# Patient Record
Sex: Female | Born: 1985 | Race: Black or African American | Hispanic: No | Marital: Single | Smoking: Current every day smoker
Health system: Southern US, Community
[De-identification: ages and names within clinical notes are randomized; demographics above are authoritative.]

## PROBLEM LIST (undated history)

## (undated) ENCOUNTER — Inpatient Hospital Stay (HOSPITAL_COMMUNITY): Payer: Self-pay

## (undated) DIAGNOSIS — N898 Other specified noninflammatory disorders of vagina: Secondary | ICD-10-CM

## (undated) DIAGNOSIS — G43909 Migraine, unspecified, not intractable, without status migrainosus: Secondary | ICD-10-CM

## (undated) DIAGNOSIS — A599 Trichomoniasis, unspecified: Secondary | ICD-10-CM

## (undated) DIAGNOSIS — D649 Anemia, unspecified: Secondary | ICD-10-CM

## (undated) DIAGNOSIS — O26892 Other specified pregnancy related conditions, second trimester: Secondary | ICD-10-CM

## (undated) DIAGNOSIS — R350 Frequency of micturition: Secondary | ICD-10-CM

## (undated) DIAGNOSIS — Z309 Encounter for contraceptive management, unspecified: Secondary | ICD-10-CM

## (undated) DIAGNOSIS — Z87898 Personal history of other specified conditions: Secondary | ICD-10-CM

## (undated) DIAGNOSIS — R319 Hematuria, unspecified: Secondary | ICD-10-CM

## (undated) DIAGNOSIS — N949 Unspecified condition associated with female genital organs and menstrual cycle: Secondary | ICD-10-CM

## (undated) DIAGNOSIS — O139 Gestational [pregnancy-induced] hypertension without significant proteinuria, unspecified trimester: Secondary | ICD-10-CM

## (undated) HISTORY — DX: Unspecified condition associated with female genital organs and menstrual cycle: N94.9

## (undated) HISTORY — DX: Frequency of micturition: R35.0

## (undated) HISTORY — DX: Trichomoniasis, unspecified: A59.9

## (undated) HISTORY — PX: NO PAST SURGERIES: SHX2092

## (undated) HISTORY — DX: Encounter for contraceptive management, unspecified: Z30.9

## (undated) HISTORY — DX: Hematuria, unspecified: R31.9

## (undated) HISTORY — DX: Other specified pregnancy related conditions, second trimester: O26.892

## (undated) HISTORY — DX: Gestational (pregnancy-induced) hypertension without significant proteinuria, unspecified trimester: O13.9

## (undated) HISTORY — DX: Other specified noninflammatory disorders of vagina: N89.8

---
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
## 2004-11-18 ENCOUNTER — Emergency Department (HOSPITAL_COMMUNITY): Admission: EM | Admit: 2004-11-18 | Discharge: 2004-11-19 | Payer: Self-pay | Admitting: *Deleted

## 2004-12-13 ENCOUNTER — Emergency Department (HOSPITAL_COMMUNITY): Admission: EM | Admit: 2004-12-13 | Discharge: 2004-12-13 | Payer: Self-pay | Admitting: Emergency Medicine

## 2004-12-15 ENCOUNTER — Emergency Department (HOSPITAL_COMMUNITY): Admission: EM | Admit: 2004-12-15 | Discharge: 2004-12-15 | Payer: Self-pay | Admitting: Emergency Medicine

## 2004-12-27 ENCOUNTER — Emergency Department (HOSPITAL_COMMUNITY): Admission: EM | Admit: 2004-12-27 | Discharge: 2004-12-28 | Payer: Self-pay | Admitting: Emergency Medicine

## 2005-01-24 ENCOUNTER — Emergency Department (HOSPITAL_COMMUNITY): Admission: EM | Admit: 2005-01-24 | Discharge: 2005-01-24 | Payer: Self-pay | Admitting: Emergency Medicine

## 2005-02-12 ENCOUNTER — Ambulatory Visit: Payer: Self-pay | Admitting: Family Medicine

## 2005-02-15 ENCOUNTER — Ambulatory Visit (HOSPITAL_COMMUNITY): Admission: RE | Admit: 2005-02-15 | Discharge: 2005-02-15 | Payer: Self-pay | Admitting: Family Medicine

## 2005-02-19 ENCOUNTER — Encounter (HOSPITAL_COMMUNITY): Admission: RE | Admit: 2005-02-19 | Discharge: 2005-03-01 | Payer: Self-pay | Admitting: Family Medicine

## 2005-02-20 ENCOUNTER — Emergency Department (HOSPITAL_COMMUNITY): Admission: EM | Admit: 2005-02-20 | Discharge: 2005-02-20 | Payer: Self-pay | Admitting: Emergency Medicine

## 2005-03-27 ENCOUNTER — Ambulatory Visit: Payer: Self-pay | Admitting: Family Medicine

## 2005-04-27 ENCOUNTER — Emergency Department (HOSPITAL_COMMUNITY): Admission: EM | Admit: 2005-04-27 | Discharge: 2005-04-27 | Payer: Self-pay | Admitting: Emergency Medicine

## 2005-05-02 ENCOUNTER — Ambulatory Visit: Payer: Self-pay | Admitting: Family Medicine

## 2005-06-08 ENCOUNTER — Emergency Department (HOSPITAL_COMMUNITY): Admission: EM | Admit: 2005-06-08 | Discharge: 2005-06-08 | Payer: Self-pay | Admitting: Emergency Medicine

## 2005-07-08 ENCOUNTER — Emergency Department (HOSPITAL_COMMUNITY): Admission: EM | Admit: 2005-07-08 | Discharge: 2005-07-08 | Payer: Self-pay | Admitting: *Deleted

## 2005-07-09 ENCOUNTER — Emergency Department (HOSPITAL_COMMUNITY): Admission: EM | Admit: 2005-07-09 | Discharge: 2005-07-09 | Payer: Self-pay | Admitting: Emergency Medicine

## 2005-08-24 ENCOUNTER — Emergency Department (HOSPITAL_COMMUNITY): Admission: EM | Admit: 2005-08-24 | Discharge: 2005-08-24 | Payer: Self-pay | Admitting: Emergency Medicine

## 2005-10-05 ENCOUNTER — Emergency Department (HOSPITAL_COMMUNITY): Admission: EM | Admit: 2005-10-05 | Discharge: 2005-10-05 | Payer: Self-pay | Admitting: Emergency Medicine

## 2005-12-07 IMAGING — CT CT HEAD W/O CM
1 series · 16 of 26 positions shown, 20 images · IV contrast (agent unspecified)
Comparison: None.

CLINICAL DATA: Left-sided headache.  
 HEAD CT WITHOUT CONTRAST:
TECHNIQUE: Contiguous axial images were obtained from the base of the skull through the vertex according to standard protocol without contrast.

[Series 2708: — · axial · 0.43mm/px · z∈[-666,-551]mm · 16 of 26 slices shown, 20 images]
[im 2/26  brain]
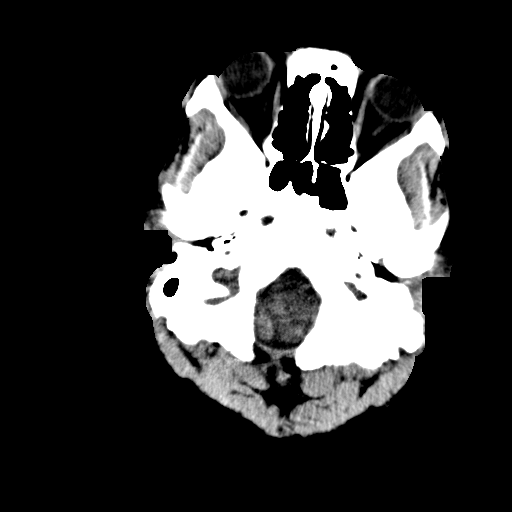
[im 2/26  bone]
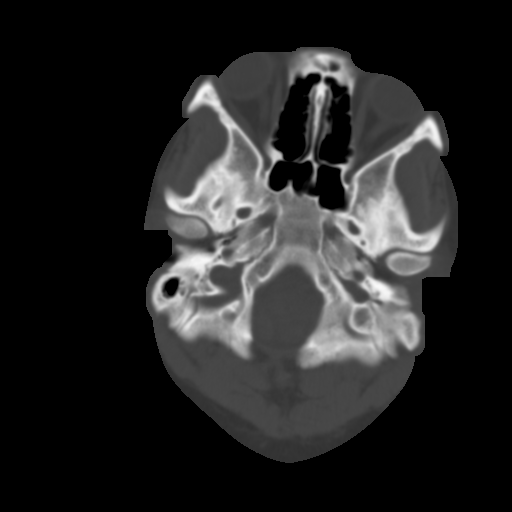
[im 4/26  brain]
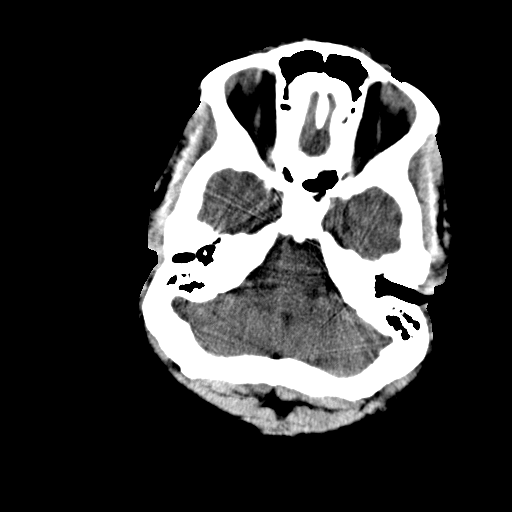
[im 5/26  brain]
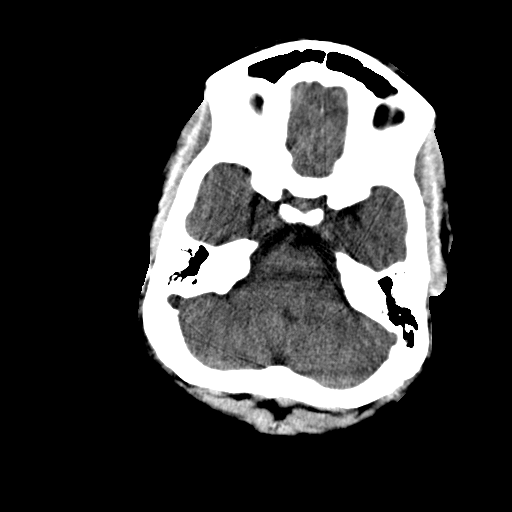
[im 7/26  brain]
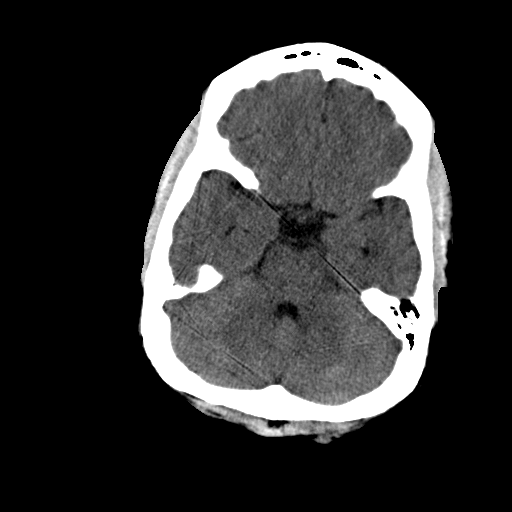
[im 8/26  brain]
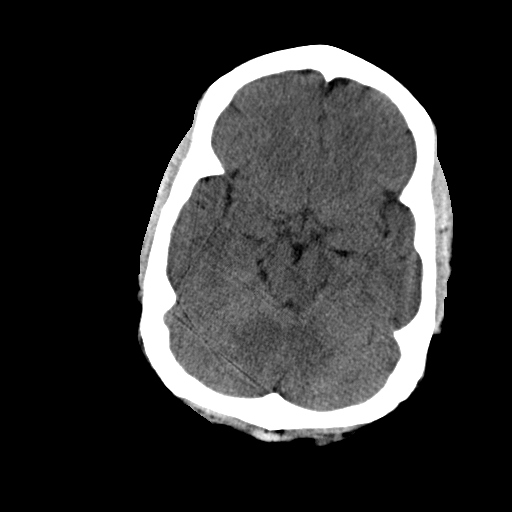
[im 8/26  bone]
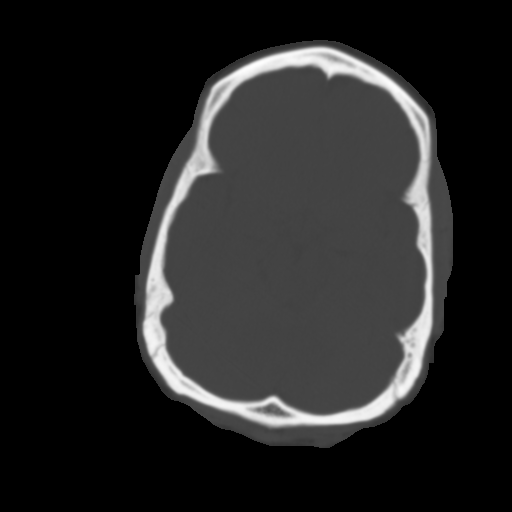
[im 10/26  brain]
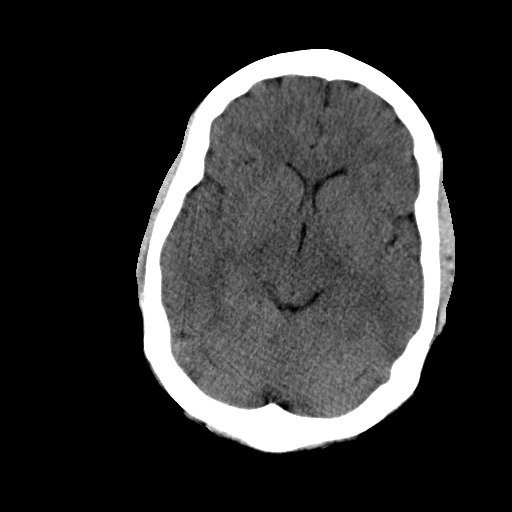
[im 11/26  brain]
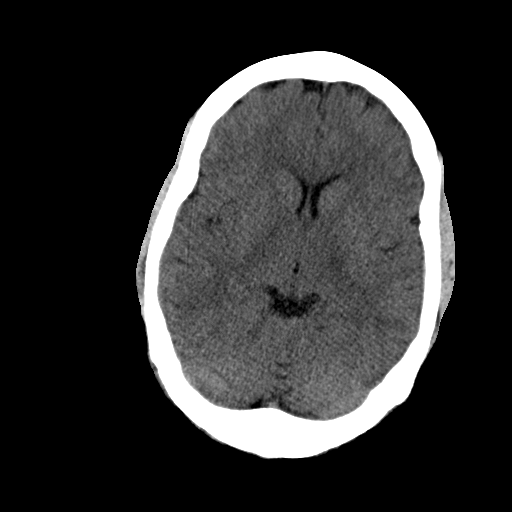
[im 13/26  brain]
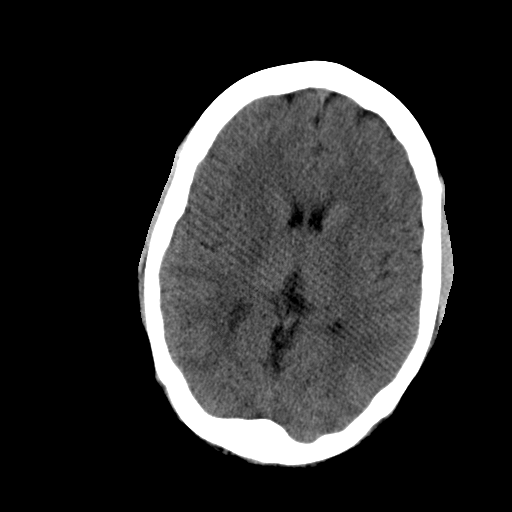
[im 14/26  brain]
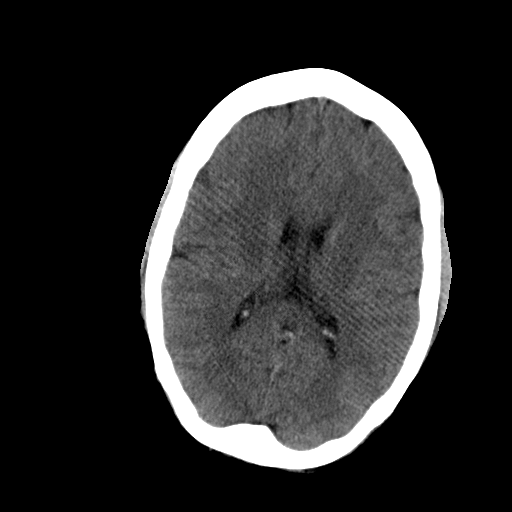
[im 14/26  bone]
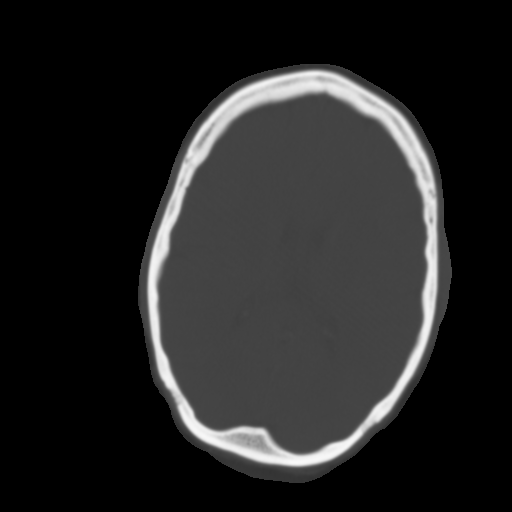
[im 16/26  brain]
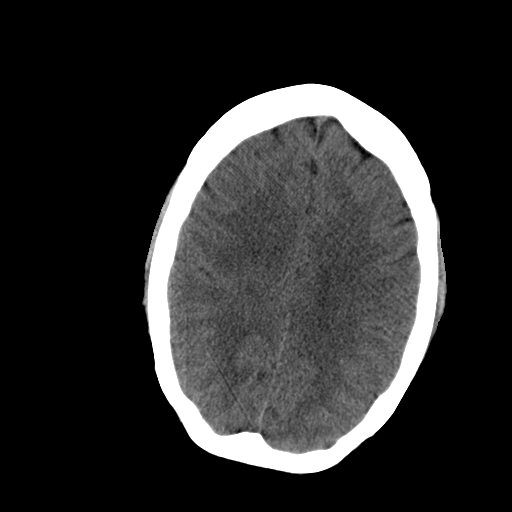
[im 17/26  brain]
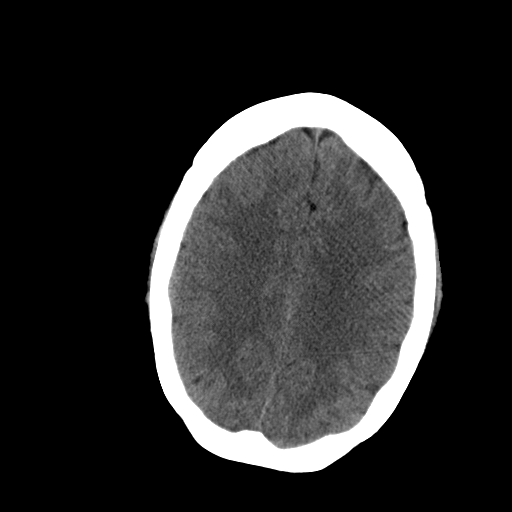
[im 19/26  brain]
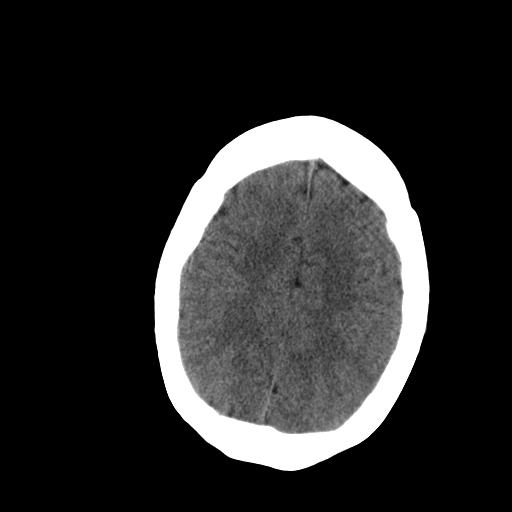
[im 20/26  brain]
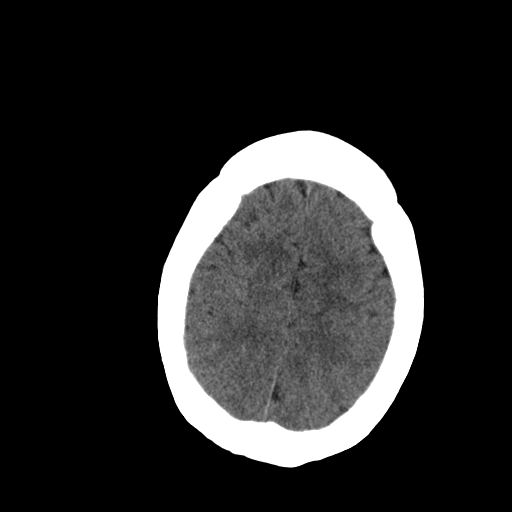
[im 20/26  bone]
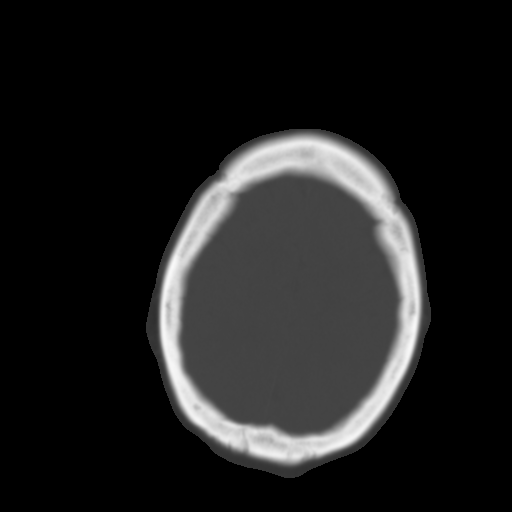
[im 22/26  brain]
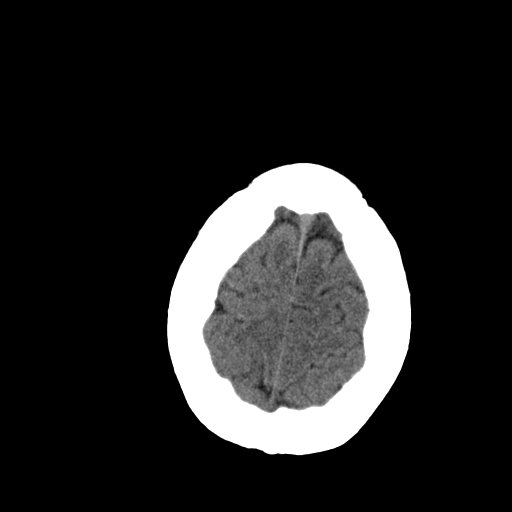
[im 23/26  brain]
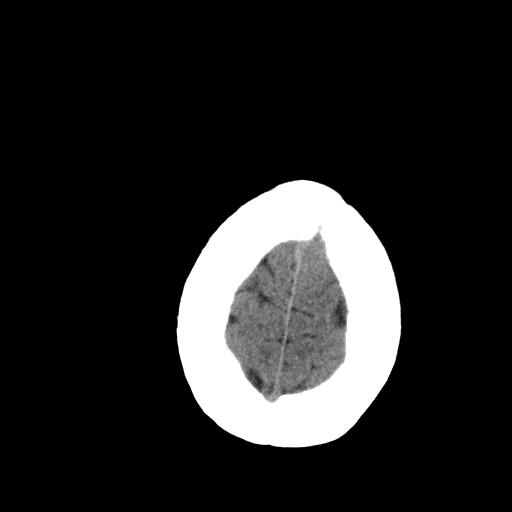
[im 25/26  brain]
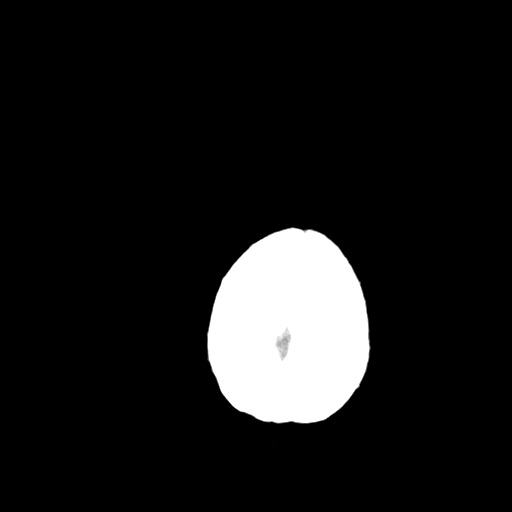

[16 of 26 positions shown; findings below may reference images not displayed]

FINDINGS: Brain appears normal without evidence of hemorrhage, infarct, mass, mass effect, midline shift, or abnormal extraaxial fluid collection.  No hydrocephalus.  Imaged paranasal sinuses and mastoid air cells are clear.
IMPRESSION: Negative head CT.

## 2006-06-09 ENCOUNTER — Emergency Department (HOSPITAL_COMMUNITY): Admission: EM | Admit: 2006-06-09 | Discharge: 2006-06-09 | Payer: Self-pay | Admitting: Emergency Medicine

## 2006-08-29 ENCOUNTER — Emergency Department (HOSPITAL_COMMUNITY): Admission: EM | Admit: 2006-08-29 | Discharge: 2006-08-29 | Payer: Self-pay | Admitting: Emergency Medicine

## 2007-03-31 ENCOUNTER — Emergency Department (HOSPITAL_COMMUNITY): Admission: EM | Admit: 2007-03-31 | Discharge: 2007-03-31 | Payer: Self-pay | Admitting: Emergency Medicine

## 2007-08-01 ENCOUNTER — Emergency Department (HOSPITAL_COMMUNITY): Admission: EM | Admit: 2007-08-01 | Discharge: 2007-08-01 | Payer: Self-pay | Admitting: Emergency Medicine

## 2007-09-06 ENCOUNTER — Emergency Department (HOSPITAL_COMMUNITY): Admission: EM | Admit: 2007-09-06 | Discharge: 2007-09-06 | Payer: Self-pay | Admitting: Emergency Medicine

## 2007-10-27 ENCOUNTER — Emergency Department (HOSPITAL_COMMUNITY): Admission: EM | Admit: 2007-10-27 | Discharge: 2007-10-27 | Payer: Self-pay | Admitting: Emergency Medicine

## 2007-10-30 ENCOUNTER — Emergency Department (HOSPITAL_COMMUNITY): Admission: EM | Admit: 2007-10-30 | Discharge: 2007-10-30 | Payer: Self-pay | Admitting: Emergency Medicine

## 2007-12-23 ENCOUNTER — Emergency Department (HOSPITAL_COMMUNITY): Admission: EM | Admit: 2007-12-23 | Discharge: 2007-12-23 | Payer: Self-pay | Admitting: Emergency Medicine

## 2008-01-25 ENCOUNTER — Emergency Department (HOSPITAL_COMMUNITY): Admission: EM | Admit: 2008-01-25 | Discharge: 2008-01-25 | Payer: Self-pay | Admitting: Emergency Medicine

## 2008-01-28 ENCOUNTER — Other Ambulatory Visit: Admission: RE | Admit: 2008-01-28 | Discharge: 2008-01-28 | Payer: Self-pay | Admitting: Obstetrics & Gynecology

## 2008-02-09 ENCOUNTER — Emergency Department (HOSPITAL_COMMUNITY): Admission: EM | Admit: 2008-02-09 | Discharge: 2008-02-09 | Payer: Self-pay | Admitting: Emergency Medicine

## 2008-03-14 ENCOUNTER — Emergency Department (HOSPITAL_COMMUNITY): Admission: EM | Admit: 2008-03-14 | Discharge: 2008-03-14 | Payer: Self-pay | Admitting: Emergency Medicine

## 2008-03-21 ENCOUNTER — Emergency Department (HOSPITAL_COMMUNITY): Admission: EM | Admit: 2008-03-21 | Discharge: 2008-03-21 | Payer: Self-pay | Admitting: Emergency Medicine

## 2008-05-01 ENCOUNTER — Emergency Department (HOSPITAL_COMMUNITY): Admission: EM | Admit: 2008-05-01 | Discharge: 2008-05-01 | Payer: Self-pay | Admitting: Emergency Medicine

## 2008-05-31 ENCOUNTER — Emergency Department (HOSPITAL_COMMUNITY): Admission: EM | Admit: 2008-05-31 | Discharge: 2008-05-31 | Payer: Self-pay | Admitting: Emergency Medicine

## 2008-07-22 ENCOUNTER — Emergency Department (HOSPITAL_COMMUNITY): Admission: EM | Admit: 2008-07-22 | Discharge: 2008-07-22 | Payer: Self-pay | Admitting: Emergency Medicine

## 2008-10-23 ENCOUNTER — Emergency Department (HOSPITAL_COMMUNITY): Admission: EM | Admit: 2008-10-23 | Discharge: 2008-10-24 | Payer: Self-pay | Admitting: Emergency Medicine

## 2008-12-28 ENCOUNTER — Emergency Department (HOSPITAL_COMMUNITY): Admission: EM | Admit: 2008-12-28 | Discharge: 2008-12-28 | Payer: Self-pay | Admitting: Emergency Medicine

## 2009-02-20 ENCOUNTER — Emergency Department (HOSPITAL_COMMUNITY): Admission: EM | Admit: 2009-02-20 | Discharge: 2009-02-20 | Payer: Self-pay | Admitting: Emergency Medicine

## 2009-05-12 ENCOUNTER — Emergency Department (HOSPITAL_COMMUNITY): Admission: EM | Admit: 2009-05-12 | Discharge: 2009-05-12 | Payer: Self-pay | Admitting: Emergency Medicine

## 2009-07-02 ENCOUNTER — Emergency Department (HOSPITAL_COMMUNITY): Admission: EM | Admit: 2009-07-02 | Discharge: 2009-07-02 | Payer: Self-pay | Admitting: Emergency Medicine

## 2009-10-10 ENCOUNTER — Emergency Department (HOSPITAL_COMMUNITY): Admission: EM | Admit: 2009-10-10 | Discharge: 2009-10-10 | Payer: Self-pay | Admitting: Emergency Medicine

## 2009-12-14 ENCOUNTER — Emergency Department (HOSPITAL_COMMUNITY): Admission: EM | Admit: 2009-12-14 | Discharge: 2009-12-14 | Payer: Self-pay | Admitting: Emergency Medicine

## 2010-01-06 ENCOUNTER — Emergency Department (HOSPITAL_COMMUNITY): Admission: EM | Admit: 2010-01-06 | Discharge: 2010-01-06 | Payer: Self-pay | Admitting: Emergency Medicine

## 2010-02-20 ENCOUNTER — Emergency Department (HOSPITAL_COMMUNITY)
Admission: EM | Admit: 2010-02-20 | Discharge: 2010-02-20 | Payer: Self-pay | Source: Home / Self Care | Admitting: Emergency Medicine

## 2010-02-26 ENCOUNTER — Emergency Department (HOSPITAL_COMMUNITY)
Admission: EM | Admit: 2010-02-26 | Discharge: 2010-02-26 | Payer: Self-pay | Source: Home / Self Care | Admitting: Emergency Medicine

## 2010-04-02 ENCOUNTER — Encounter: Payer: Self-pay | Admitting: Family Medicine

## 2010-05-30 LAB — PREGNANCY, URINE: Preg Test, Ur: NEGATIVE

## 2010-05-30 LAB — URINALYSIS, ROUTINE W REFLEX MICROSCOPIC
Ketones, ur: NEGATIVE mg/dL
Nitrite: NEGATIVE
Specific Gravity, Urine: >1.03 — ABNORMAL HIGH (ref 1.005–1.030)
pH: 5.5 (ref 5.0–8.0)

## 2010-05-30 LAB — URINE MICROSCOPIC-ADD ON

## 2010-06-08 ENCOUNTER — Emergency Department (HOSPITAL_COMMUNITY): Payer: Self-pay

## 2010-06-08 ENCOUNTER — Emergency Department (HOSPITAL_COMMUNITY)
Admission: EM | Admit: 2010-06-08 | Discharge: 2010-06-09 | Discharge status: Against Medical Advice | Payer: Self-pay | Attending: Emergency Medicine | Admitting: Emergency Medicine

## 2010-06-08 DIAGNOSIS — R079 Chest pain, unspecified: Secondary | ICD-10-CM | POA: Insufficient documentation

## 2010-06-08 LAB — URINALYSIS, ROUTINE W REFLEX MICROSCOPIC
Bilirubin Urine: NEGATIVE
Hgb urine dipstick: NEGATIVE
Ketones, ur: NEGATIVE mg/dL
Specific Gravity, Urine: >1.03 — ABNORMAL HIGH (ref 1.005–1.030)
Urobilinogen, UA: 1 mg/dL (ref 0.0–1.0)

## 2010-06-08 LAB — POCT CARDIAC MARKERS
CKMB, poc: <1 ng/mL — ABNORMAL LOW (ref 1.0–8.0)
Myoglobin, poc: 42.7 ng/mL (ref 12–200)

## 2010-06-08 LAB — DIFFERENTIAL
Basophils Absolute: 0 10*3/uL (ref 0.0–0.1)
Lymphocytes Relative: 36 % (ref 12–46)
Monocytes Absolute: 0.5 10*3/uL (ref 0.1–1.0)
Neutro Abs: 3.6 10*3/uL (ref 1.7–7.7)

## 2010-06-08 LAB — CBC
HCT: 36.4 % (ref 36.0–46.0)
Hemoglobin: 12.4 g/dL (ref 12.0–15.0)
WBC: 6.7 10*3/uL (ref 4.0–10.5)

## 2010-06-08 LAB — PREGNANCY, URINE: Preg Test, Ur: NEGATIVE

## 2010-06-08 LAB — URINE MICROSCOPIC-ADD ON

## 2010-06-09 LAB — BASIC METABOLIC PANEL
Calcium: 9.3 mg/dL (ref 8.4–10.5)
GFR calc Af Amer: >60 mL/min (ref >60)
GFR calc non Af Amer: >60 mL/min (ref >60)
Sodium: 137 mEq/L (ref 135–145)

## 2010-06-17 LAB — POCT CARDIAC MARKERS
CKMB, poc: <1 ng/mL — ABNORMAL LOW (ref 1.0–8.0)
Troponin i, poc: <0.05 ng/mL (ref 0.00–0.09)

## 2010-06-17 LAB — DIFFERENTIAL
Basophils Absolute: 0 10*3/uL (ref 0.0–0.1)
Lymphocytes Relative: 34 % (ref 12–46)
Monocytes Absolute: 0.4 10*3/uL (ref 0.1–1.0)
Monocytes Relative: 9 % (ref 3–12)
Neutro Abs: 2.7 10*3/uL (ref 1.7–7.7)

## 2010-06-17 LAB — CBC
Hemoglobin: 10.6 g/dL — ABNORMAL LOW (ref 12.0–15.0)
RBC: 3.57 MIL/uL — ABNORMAL LOW (ref 3.87–5.11)

## 2010-06-17 LAB — D-DIMER, QUANTITATIVE: D-Dimer, Quant: 0.22 ug/mL-FEU (ref 0.00–0.48)

## 2010-06-20 LAB — PREGNANCY, URINE: Preg Test, Ur: NEGATIVE

## 2010-06-22 LAB — URINALYSIS, ROUTINE W REFLEX MICROSCOPIC
Glucose, UA: NEGATIVE mg/dL
Hgb urine dipstick: NEGATIVE
Ketones, ur: NEGATIVE mg/dL
Protein, ur: NEGATIVE mg/dL

## 2010-06-26 ENCOUNTER — Emergency Department (HOSPITAL_COMMUNITY)
Admission: EM | Admit: 2010-06-26 | Discharge: 2010-06-26 | Discharge status: Against Medical Advice | Payer: Self-pay | Attending: Emergency Medicine | Admitting: Emergency Medicine

## 2010-06-26 DIAGNOSIS — B009 Herpesviral infection, unspecified: Secondary | ICD-10-CM | POA: Insufficient documentation

## 2010-06-26 LAB — URINALYSIS, ROUTINE W REFLEX MICROSCOPIC
Bilirubin Urine: NEGATIVE
Glucose, UA: NEGATIVE mg/dL
Hgb urine dipstick: NEGATIVE
Specific Gravity, Urine: >1.03 — ABNORMAL HIGH (ref 1.005–1.030)
pH: 5.5 (ref 5.0–8.0)

## 2010-06-26 LAB — CBC
HCT: 36.4 % (ref 36.0–46.0)
Hemoglobin: 11.9 g/dL — ABNORMAL LOW (ref 12.0–15.0)
MCHC: 32.7 g/dL (ref 30.0–36.0)
MCV: 86.7 fL (ref 78.0–100.0)
RBC: 4.2 MIL/uL (ref 3.87–5.11)

## 2010-06-26 LAB — COMPREHENSIVE METABOLIC PANEL
ALT: 10 U/L (ref 0–35)
CO2: 26 mEq/L (ref 19–32)
Calcium: 9.1 mg/dL (ref 8.4–10.5)
Creatinine, Ser: 0.82 mg/dL (ref 0.4–1.2)
GFR calc non Af Amer: >60 mL/min (ref >60)
Glucose, Bld: 95 mg/dL (ref 70–99)
Sodium: 136 mEq/L (ref 135–145)

## 2010-06-26 LAB — LIPASE, BLOOD: Lipase: 25 U/L (ref 11–59)

## 2010-06-26 LAB — DIFFERENTIAL
Eosinophils Absolute: 0.1 10*3/uL (ref 0.0–0.7)
Lymphs Abs: 2.8 10*3/uL (ref 0.7–4.0)
Neutrophils Relative %: 42 % — ABNORMAL LOW (ref 43–77)

## 2010-06-27 LAB — PREGNANCY, URINE: Preg Test, Ur: NEGATIVE

## 2010-06-27 LAB — WET PREP, GENITAL: WBC, Wet Prep HPF POC: NONE SEEN

## 2010-06-27 LAB — URINALYSIS, ROUTINE W REFLEX MICROSCOPIC
Bilirubin Urine: NEGATIVE
Ketones, ur: NEGATIVE mg/dL
Nitrite: NEGATIVE
Specific Gravity, Urine: >1.03 — ABNORMAL HIGH (ref 1.005–1.030)
Urobilinogen, UA: 0.2 mg/dL (ref 0.0–1.0)

## 2010-08-27 ENCOUNTER — Emergency Department (HOSPITAL_COMMUNITY)
Admission: EM | Admit: 2010-08-27 | Discharge: 2010-08-27 | Disposition: A | Discharge status: Home / Self Care | Payer: Self-pay | Attending: Emergency Medicine | Admitting: Emergency Medicine

## 2010-08-27 DIAGNOSIS — G43909 Migraine, unspecified, not intractable, without status migrainosus: Secondary | ICD-10-CM | POA: Insufficient documentation

## 2010-10-05 ENCOUNTER — Emergency Department (HOSPITAL_COMMUNITY)
Admission: EM | Admit: 2010-10-05 | Discharge: 2010-10-05 | Disposition: A | Discharge status: Home / Self Care | Payer: Self-pay | Attending: Emergency Medicine | Admitting: Emergency Medicine

## 2010-10-05 DIAGNOSIS — F17200 Nicotine dependence, unspecified, uncomplicated: Secondary | ICD-10-CM | POA: Insufficient documentation

## 2010-10-05 DIAGNOSIS — M545 Low back pain, unspecified: Secondary | ICD-10-CM | POA: Insufficient documentation

## 2010-10-05 DIAGNOSIS — J45909 Unspecified asthma, uncomplicated: Secondary | ICD-10-CM | POA: Insufficient documentation

## 2010-10-05 DIAGNOSIS — N390 Urinary tract infection, site not specified: Secondary | ICD-10-CM | POA: Insufficient documentation

## 2010-10-05 DIAGNOSIS — Z862 Personal history of diseases of the blood and blood-forming organs and certain disorders involving the immune mechanism: Secondary | ICD-10-CM | POA: Insufficient documentation

## 2010-10-05 HISTORY — DX: Anemia, unspecified: D64.9

## 2010-10-05 LAB — URINALYSIS, ROUTINE W REFLEX MICROSCOPIC
Nitrite: NEGATIVE
Specific Gravity, Urine: >1.03 — ABNORMAL HIGH (ref 1.005–1.030)
Urobilinogen, UA: 0.2 mg/dL (ref 0.0–1.0)

## 2010-10-05 LAB — URINE MICROSCOPIC-ADD ON

## 2010-10-05 MED ORDER — CEPHALEXIN 500 MG PO CAPS: 500.0000 mg | ORAL_CAPSULE | Freq: Four times a day (QID) | ORAL | Status: AC

## 2010-10-05 MED ORDER — HYDROCODONE-ACETAMINOPHEN 5-325 MG PO TABS: ORAL_TABLET | ORAL | Status: AC

## 2010-10-05 NOTE — ED Notes (Signed)
Pt reports lower back pain that began a few days ago.  Pt states that she has been trying to work with it but it keeps getting worse.  Pt denies any injury to her back.  Pt has been trying several OTC drugs w/out relief.  nad noted

## 2010-10-05 NOTE — ED Provider Notes (Signed)
History     Chief Complaint  Patient presents with  . Back Pain   Patient is a 25 y.o. female presenting with back pain. The history is provided by the patient.  Back Pain  This is a new problem. The current episode started 2 days ago. The problem occurs constantly. The problem has not changed since onset.The pain is associated with no known injury. The pain is present in the lumbar spine. The quality of the pain is described as aching. The pain radiates to the right thigh. The pain is moderate. The symptoms are aggravated by bending and twisting (standing, walking). The pain is the same all the time. Pertinent negatives include no fever, no numbness, no headaches, no abdominal pain, no abdominal swelling, no bowel incontinence, no perianal numbness, no bladder incontinence, no dysuria, no pelvic pain, no paresis, no tingling and no weakness. She has tried NSAIDs for the symptoms.    Past Medical History  Diagnosis Date  . Asthma   . Anemia     History reviewed. No pertinent past surgical history.  History reviewed. No pertinent family history.  History  Substance Use Topics  . Smoking status: Current Everyday Smoker  . Smokeless tobacco: Not on file  . Alcohol Use: No    OB History    Grav Para Term Preterm Abortions TAB SAB Ect Mult Living                  Review of Systems  Constitutional: Negative for fever, chills and appetite change.  HENT: Negative for trouble swallowing, neck pain, neck stiffness and sinus pressure.   Respiratory: Negative.   Cardiovascular: Negative.   Gastrointestinal: Negative for nausea, vomiting, abdominal pain, diarrhea and bowel incontinence.  Genitourinary: Negative for bladder incontinence, dysuria, hematuria, vaginal bleeding, vaginal discharge, difficulty urinating, vaginal pain, menstrual problem and pelvic pain.  Musculoskeletal: Positive for back pain. Negative for myalgias and joint swelling.  Skin: Negative.   Neurological: Negative  for dizziness, tingling, weakness, numbness and headaches.  Hematological: Does not bruise/bleed easily.  Psychiatric/Behavioral: Negative for behavioral problems.    Physical Exam  BP 124/69  Pulse 61  Temp(Src) 98.6 F (37 C) (Oral)  Resp 18  Ht 5\' 2"  (1.575 m)  Wt 179 lb (81.194 kg)  BMI 32.74 kg/m2  SpO2 100%  LMP 09/07/2010  Physical Exam  Nursing note and vitals reviewed. Constitutional: She is oriented to person, place, and time. She appears well-developed and well-nourished. No distress.  HENT:  Head: Normocephalic and atraumatic.  Eyes: EOM are normal. Pupils are equal, round, and reactive to light.  Neck: Normal range of motion. Neck supple.  Cardiovascular: Normal rate, regular rhythm and normal heart sounds.   Pulmonary/Chest: Effort normal and breath sounds normal.  Abdominal: She exhibits no distension. There is no tenderness. There is no rebound and no guarding.  Musculoskeletal: She exhibits tenderness. She exhibits no edema.       Lumbar back: She exhibits tenderness. She exhibits no swelling and no spasm.       Back:  Lymphadenopathy:    She has no cervical adenopathy.  Neurological: She is alert and oriented to person, place, and time. She has normal reflexes. She exhibits normal muscle tone. Coordination normal.  Skin: Skin is warm and dry.  Psychiatric: She has a normal mood and affect.    ED Course  Procedures  MDM     1015  Patient ambulated to the restroom w/o difficulty, no focal neuro deficits or weakness  on exam.  1040  Urine culture is pending.  I have reviewed the lab results with the patient.    Jeannetta Cerutti L. Katalena Malveaux, [place] 10/05/10 1114

## 2010-10-06 LAB — URINE CULTURE
Colony Count: 25000
Culture  Setup Time: 201207261315

## 2010-11-30 LAB — URINALYSIS, ROUTINE W REFLEX MICROSCOPIC
Bilirubin Urine: NEGATIVE
Glucose, UA: NEGATIVE
Hgb urine dipstick: NEGATIVE
Ketones, ur: NEGATIVE
Protein, ur: NEGATIVE
Urobilinogen, UA: 0.2

## 2010-11-30 LAB — PREGNANCY, URINE: Preg Test, Ur: NEGATIVE

## 2010-12-07 LAB — DIFFERENTIAL
Basophils Absolute: 0
Lymphocytes Relative: 33
Lymphs Abs: 1.5
Monocytes Absolute: 0.5
Neutro Abs: 2.5

## 2010-12-07 LAB — CBC
HCT: 32.4 — ABNORMAL LOW
MCHC: 32.5
MCV: 76.7 — ABNORMAL LOW
Platelets: 224
RDW: 19 — ABNORMAL HIGH

## 2010-12-07 LAB — URINALYSIS, ROUTINE W REFLEX MICROSCOPIC
Nitrite: NEGATIVE
Specific Gravity, Urine: >1.03 — ABNORMAL HIGH
Urobilinogen, UA: 0.2

## 2010-12-07 LAB — COMPREHENSIVE METABOLIC PANEL
Albumin: 3.5
BUN: 12
Calcium: 8.9
Chloride: 112
Creatinine, Ser: 1.03
Total Bilirubin: 0.7
Total Protein: 6.5

## 2010-12-07 LAB — URINE MICROSCOPIC-ADD ON

## 2010-12-11 LAB — URINALYSIS, ROUTINE W REFLEX MICROSCOPIC
Glucose, UA: NEGATIVE
Hgb urine dipstick: NEGATIVE
Ketones, ur: NEGATIVE
Protein, ur: NEGATIVE

## 2010-12-12 LAB — URINALYSIS, ROUTINE W REFLEX MICROSCOPIC
Glucose, UA: NEGATIVE
Hgb urine dipstick: NEGATIVE
Specific Gravity, Urine: 1.02

## 2010-12-12 LAB — WET PREP, GENITAL
Trich, Wet Prep: NONE SEEN
WBC, Wet Prep HPF POC: NONE SEEN
Yeast Wet Prep HPF POC: NONE SEEN

## 2010-12-12 LAB — GC/CHLAMYDIA PROBE AMP, GENITAL: GC Probe Amp, Genital: NEGATIVE

## 2011-03-22 ENCOUNTER — Encounter (HOSPITAL_COMMUNITY): Payer: Self-pay | Admitting: *Deleted

## 2011-03-22 ENCOUNTER — Emergency Department (HOSPITAL_COMMUNITY)
Admission: EM | Admit: 2011-03-22 | Discharge: 2011-03-22 | Disposition: A | Discharge status: Home / Self Care | Payer: Self-pay | Attending: Emergency Medicine | Admitting: Emergency Medicine

## 2011-03-22 DIAGNOSIS — R51 Headache: Secondary | ICD-10-CM | POA: Insufficient documentation

## 2011-03-22 DIAGNOSIS — J45909 Unspecified asthma, uncomplicated: Secondary | ICD-10-CM | POA: Insufficient documentation

## 2011-03-22 DIAGNOSIS — H53149 Visual discomfort, unspecified: Secondary | ICD-10-CM | POA: Insufficient documentation

## 2011-03-22 MED ORDER — METOCLOPRAMIDE HCL 5 MG/ML IJ SOLN
10.0000 mg | Freq: Once | INTRAMUSCULAR | Status: AC
  Administered 2011-03-22: 10 mg via INTRAMUSCULAR
  Filled 2011-03-22: qty 2

## 2011-03-22 MED ORDER — HYDROMORPHONE HCL PF 1 MG/ML IJ SOLN
1.0000 mg | Freq: Once | INTRAMUSCULAR | Status: AC
  Administered 2011-03-22: 1 mg via INTRAMUSCULAR
  Filled 2011-03-22: qty 1

## 2011-03-22 MED ORDER — NAPROXEN 500 MG PO TABS: 500.0000 mg | ORAL_TABLET | Freq: Two times a day (BID) | ORAL | Status: DC

## 2011-03-22 NOTE — ED Provider Notes (Signed)
History     CSN: [id number]  Arrival date & time 03/22/11  4098   First MD Initiated Contact with Patient 03/22/11 0559      Chief Complaint  Patient presents with  . Headache    (Consider location/radiation/quality/duration/timing/severity/associated sxs/prior treatment) Patient is a 26 y.o. female presenting with headaches. The history is provided by the patient.  Headache    patient reports headache for approximately 4-5 days now.  She reports photophobia without phonophobia.  She reports is similar to her prior headaches.  She's been diagnosed with migraine headaches before in the past.  She does not have a neurologist.  She reports trying ibuprofen and Tylenol at home without success.  She denies recent trauma.  She denies unilateral arm or leg weakness.  She denies fever or chills.  She denies sore throat.   Nothing improves her symptoms.  Her symptoms are constant.  They're moderate in severity the  Past Medical History  Diagnosis Date  . Asthma   . Anemia     History reviewed. No pertinent past surgical history.  History reviewed. No pertinent family history.  History  Substance Use Topics  . Smoking status: Current Everyday Smoker  . Smokeless tobacco: Not on file  . Alcohol Use: No    OB History    Grav Para Term Preterm Abortions TAB SAB Ect Mult Living                  Review of Systems  Neurological: Positive for headaches.  All other systems reviewed and are negative.    Allergies  Review of patient's allergies indicates no known allergies.  Home Medications   Current Outpatient Rx  Name Route Sig Dispense Refill  . ACETAMINOPHEN 500 MG PO TABS Oral Take 500 mg by mouth every 6 (six) hours as needed. pain     . ALBUTEROL SULFATE HFA 108 (90 BASE) MCG/ACT IN AERS Inhalation Inhale 2 puffs into the lungs every 6 (six) hours as needed. Shortness of breath     . ALBUTEROL SULFATE HFA IN Inhalation Inhale into the lungs.      Marland Kitchen NAPROXEN 500 MG PO  TABS Oral Take 1 tablet (500 mg total) by mouth 2 (two) times daily. 10 tablet 0    BP 124/86  Pulse 55  Temp(Src) 98.1 F (36.7 C) (Oral)  Resp 20  Ht 5\' 2"  (1.575 m)  Wt 186 lb (84.369 kg)  BMI 34.02 kg/m2  SpO2 99%  LMP 03/07/2011  Physical Exam  Nursing note and vitals reviewed. Constitutional: She is oriented to person, place, and time. She appears well-developed and well-nourished.  HENT:  Head: Normocephalic and atraumatic.  Eyes: Pupils are equal, round, and reactive to light.  Cardiovascular: Regular rhythm.   Pulmonary/Chest: Effort normal.  Abdominal: Soft.  Neurological: She is alert and oriented to person, place, and time.       5/5 strength in major muscle groups of  bilateral upper and lower extremities. Speech normal. No facial asymetry.     ED Course  Procedures (including critical care time)  Labs Reviewed - No data to display No results found.   1. Headache       MDM  Typical migraine headache for the pt. Non focal neuro exam. No recent head trauma. No fever. Doubt meningitis. Doubt intracranial bleed. Doubt normal pressure hydrocephalus. No indication for imaging. Will treat with migraine cocktail and reevaluate  7:28 AM The patient feels much better at this time.  We'll discharge  home with a prescription for Naprosyn.  She's been given neurology followup        [name], MD 03/22/11 [phone]

## 2011-03-22 NOTE — ED Notes (Addendum)
Pt states headache for 1 week. Not getting any better. States have been taken tylenol, ibuprofen, & benydral w/ no relief. Pt denies any n/v/d. Pt states she has had some dizziness & blurred vision that clears up after laying down.

## 2011-03-22 NOTE — ED Notes (Signed)
Pt resting comfortably on stretcher.

## 2011-06-18 ENCOUNTER — Encounter (HOSPITAL_COMMUNITY): Payer: Self-pay | Admitting: Emergency Medicine

## 2011-06-18 ENCOUNTER — Emergency Department (HOSPITAL_COMMUNITY): Payer: Self-pay

## 2011-06-18 ENCOUNTER — Emergency Department (HOSPITAL_COMMUNITY)
Admission: EM | Admit: 2011-06-18 | Discharge: 2011-06-18 | Disposition: A | Discharge status: Home / Self Care | Payer: Self-pay | Attending: Emergency Medicine | Admitting: Emergency Medicine

## 2011-06-18 DIAGNOSIS — R350 Frequency of micturition: Secondary | ICD-10-CM | POA: Insufficient documentation

## 2011-06-18 DIAGNOSIS — M549 Dorsalgia, unspecified: Secondary | ICD-10-CM

## 2011-06-18 DIAGNOSIS — M545 Low back pain, unspecified: Secondary | ICD-10-CM | POA: Insufficient documentation

## 2011-06-18 DIAGNOSIS — F17200 Nicotine dependence, unspecified, uncomplicated: Secondary | ICD-10-CM | POA: Insufficient documentation

## 2011-06-18 DIAGNOSIS — J45909 Unspecified asthma, uncomplicated: Secondary | ICD-10-CM | POA: Insufficient documentation

## 2011-06-18 LAB — URINALYSIS, ROUTINE W REFLEX MICROSCOPIC
Glucose, UA: NEGATIVE mg/dL
Hgb urine dipstick: NEGATIVE
Ketones, ur: NEGATIVE mg/dL
Protein, ur: NEGATIVE mg/dL

## 2011-06-18 LAB — POCT PREGNANCY, URINE: Preg Test, Ur: NEGATIVE

## 2011-06-18 MED ORDER — CYCLOBENZAPRINE HCL 10 MG PO TABS: 10.0000 mg | ORAL_TABLET | Freq: Three times a day (TID) | ORAL | Status: AC | PRN

## 2011-06-18 MED ORDER — HYDROCODONE-ACETAMINOPHEN 5-325 MG PO TABS: ORAL_TABLET | ORAL | Status: AC

## 2011-06-18 NOTE — Discharge Instructions (Signed)
Back Pain, Adult Low back pain is very common. About 1 in 5 people have back pain.The cause of low back pain is rarely dangerous. The pain often gets better over time.About half of people with a sudden onset of back pain feel better in just 2 weeks. About 8 in 10 people feel better by 6 weeks.  CAUSES Some common causes of back pain include:  Strain of the muscles or ligaments supporting the spine.   Wear and tear (degeneration) of the spinal discs.   Arthritis.   Direct injury to the back.  DIAGNOSIS Most of the time, the direct cause of low back pain is not known.However, back pain can be treated effectively even when the exact cause of the pain is unknown.Answering your caregiver's questions about your overall health and symptoms is one of the most accurate ways to make sure the cause of your pain is not dangerous. If your caregiver needs more information, he or she may order lab work or imaging tests (X-rays or MRIs).However, even if imaging tests show changes in your back, this usually does not require surgery. HOME CARE INSTRUCTIONS For many people, back pain returns.Since low back pain is rarely dangerous, it is often a condition that people can learn to manageon their own.   Remain active. It is stressful on the back to sit or stand in one place. Do not sit, drive, or stand in one place for more than 30 minutes at a time. Take short walks on level surfaces as soon as pain allows.Try to increase the length of time you walk each day.   Do not stay in bed.Resting more than 1 or 2 days can delay your recovery.   Do not avoid exercise or work.Your body is made to move.It is not dangerous to be active, even though your back may hurt.Your back will likely heal faster if you return to being active before your pain is gone.   Pay attention to your body when you bend and lift. Many people have less discomfortwhen lifting if they bend their knees, keep the load close to their  bodies,and avoid twisting. Often, the most comfortable positions are those that put less stress on your recovering back.   Find a comfortable position to sleep. Use a firm mattress and lie on your side with your knees slightly bent. If you lie on your back, put a pillow under your knees.   Only take over-the-counter or prescription medicines as directed by your caregiver. Over-the-counter medicines to reduce pain and inflammation are often the most helpful.Your caregiver may prescribe muscle relaxant drugs.These medicines help dull your pain so you can more quickly return to your normal activities and healthy exercise.   Put ice on the injured area.   Put ice in a plastic bag.   Place a towel between your skin and the bag.   Leave the ice on for 15 to 20 minutes, 3 to 4 times a day for the first 2 to 3 days. After that, ice and heat may be alternated to reduce pain and spasms.   Ask your caregiver about trying back exercises and gentle massage. This may be of some benefit.   Avoid feeling anxious or stressed.Stress increases muscle tension and can worsen back pain.It is important to recognize when you are anxious or stressed and learn ways to manage it.Exercise is a great option.  SEEK MEDICAL CARE IF:  You have pain that is not relieved with rest or medicine.   You have   pain that does not improve in 1 week.   You have new symptoms.   You are generally not feeling well.  SEEK IMMEDIATE MEDICAL CARE IF:   You have pain that radiates from your back into your legs.   You develop new bowel or bladder control problems.   You have unusual weakness or numbness in your arms or legs.   You develop nausea or vomiting.   You develop abdominal pain.   You feel faint.  Document Released: 02/26/2005 Document Revised: 02/15/2011 Document Reviewed: 07/17/2010 [employer] Patient Information 2012 [employer]. 

## 2011-06-18 NOTE — ED Notes (Signed)
Pt c/o low back pain, recurrent. C/O pressure and urinary frequency.

## 2011-06-18 NOTE — ED Provider Notes (Signed)
History     CSN: [id number]  Arrival date & time 06/18/11  1311   First MD Initiated Contact with Patient 06/18/11 1328      Chief Complaint  Patient presents with  . Back Pain    (Consider location/radiation/quality/duration/timing/severity/associated sxs/prior treatment) HPI Comments: Patient with hx of low back pain for several days.  She also c/o urinary frequency for several days.  She denies burning with urination, vaginal pain, discharge or bleeding.  Also denies abdominal pain.  PAin to her back is worse with movement and improves with rest.  Patient states back pain is similar to previous  Patient is a 26 y.o. female presenting with back pain. The history is provided by the patient.  Back Pain  This is a recurrent problem. The current episode started 2 days ago. The problem occurs constantly. The problem has not changed since onset.The pain is associated with no known injury. The pain is present in the lumbar spine. The quality of the pain is described as aching. The pain does not radiate. The pain is mild. The pain is the same all the time. Pertinent negatives include no fever, no numbness, no abdominal pain, no abdominal swelling, no bowel incontinence, no perianal numbness, no bladder incontinence, no pelvic pain, no leg pain, no paresthesias, no paresis, no tingling and no weakness. She has tried nothing for the symptoms. The treatment provided no relief.    Past Medical History  Diagnosis Date  . Asthma   . Anemia     History reviewed. No pertinent past surgical history.  Family History  Problem Relation Age of Onset  . Cancer Mother   . Hypertension Father     History  Substance Use Topics  . Smoking status: Current Everyday Smoker  . Smokeless tobacco: Not on file  . Alcohol Use: No    OB History    Grav Para Term Preterm Abortions TAB SAB Ect Mult Living                  Review of Systems  Constitutional: Negative for fever, chills, activity change  and appetite change.  Gastrointestinal: Negative for nausea, vomiting, abdominal pain and bowel incontinence.  Genitourinary: Positive for frequency. Negative for bladder incontinence, hematuria, flank pain, decreased urine volume, vaginal bleeding, vaginal discharge, difficulty urinating, genital sores, vaginal pain and pelvic pain.  Musculoskeletal: Positive for back pain. Negative for gait problem.  Skin: Negative.   Neurological: Negative for tingling, weakness, numbness and paresthesias.  All other systems reviewed and are negative.    Allergies  Review of patient's allergies indicates no known allergies.  Home Medications   Current Outpatient Rx  Name Route Sig Dispense Refill  . ACETAMINOPHEN 500 MG PO TABS Oral Take 500 mg by mouth every 6 (six) hours as needed. pain     . ALBUTEROL SULFATE HFA 108 (90 BASE) MCG/ACT IN AERS Inhalation Inhale 2 puffs into the lungs every 6 (six) hours as needed. Shortness of breath       BP 130/79  Pulse 83  Temp(Src) 98 F (36.7 C) (Oral)  Resp 16  Ht 5\' 2"  (1.575 m)  Wt 180 lb (81.647 kg)  BMI 32.92 kg/m2  SpO2 100%  LMP 05/23/2011  Physical Exam  Nursing note and vitals reviewed. Constitutional: She is oriented to person, place, and time. She appears well-developed and well-nourished. No distress.  HENT:  Head: Normocephalic and atraumatic.  Cardiovascular: Normal rate, regular rhythm, normal heart sounds and intact distal pulses.  No murmur heard. Pulmonary/Chest: Effort normal and breath sounds normal. No respiratory distress.  Abdominal: Soft. She exhibits no distension and no mass. There is no tenderness. There is no rebound and no guarding.       No CVA tenderness  Musculoskeletal: Normal range of motion. She exhibits tenderness. She exhibits no edema.       Lumbar back: She exhibits tenderness and pain. She exhibits normal range of motion, no bony tenderness, no swelling, no edema, no deformity, no laceration and normal  pulse.       Back:       ttp of the lumbar paraspinal muscles  Neurological: She is alert and oriented to person, place, and time. No sensory deficit. She exhibits normal muscle tone. Coordination and gait normal.  Reflex Scores:      Patellar reflexes are 2+ on the right side and 2+ on the left side.      Achilles reflexes are 2+ on the right side and 2+ on the left side. Skin: Skin is warm and dry.    ED Course  Procedures (including critical care time)  Results for orders placed during the hospital encounter of 06/18/11  URINALYSIS, ROUTINE W REFLEX MICROSCOPIC      Component Value Range   Color, Urine YELLOW  YELLOW    APPearance CLEAR  CLEAR    Specific Gravity, Urine >1.030 (*) 1.005 - 1.030    pH 5.5  5.0 - 8.0    Glucose, UA NEGATIVE  NEGATIVE (mg/dL)   Hgb urine dipstick NEGATIVE  NEGATIVE    Bilirubin Urine NEGATIVE  NEGATIVE    Ketones, ur NEGATIVE  NEGATIVE (mg/dL)   Protein, ur NEGATIVE  NEGATIVE (mg/dL)   Urobilinogen, UA 0.2  0.0 - 1.0 (mg/dL)   Nitrite NEGATIVE  NEGATIVE    Leukocytes, UA NEGATIVE  NEGATIVE   POCT PREGNANCY, URINE      Component Value Range   Preg Test, Ur NEGATIVE  NEGATIVE   URINE CULTURE      Component Value Range   Specimen Description URINE, CLEAN CATCH     Special Requests NONE     Culture  Setup Time [id number]     Colony Count NO GROWTH     Culture NO GROWTH     Report Status 06/19/2011 FINAL      Dg Lumbar Spine Complete  06/18/2011  *RADIOLOGY REPORT*  Clinical Data: Low back pain status post fall from stairs.  LUMBAR SPINE - COMPLETE 4+ VIEW  Comparison: Abdominal radiographs 03/14/2008.  Findings: There are five lumbar type vertebral bodies.  Alignment is normal.  There is no evidence of fracture or pars defect.  The disc spaces are preserved.  IMPRESSION: Normal examination.  No acute osseous findings or malalignment.  Original Report Authenticated By: Gerrianne Scale, M.D.     Urine culture is pending   MDM     Patient with a history of recurrent low back pain and previous visits to the ED. She has tenderness to palpation of the lumbar paraspinal muscles. No edema. No focal neuro deficits on exam. Patient is ambulatory without difficulty. Abdomen remains soft and nontender. Pain to her lower back is reproduced with straight-leg raise bilaterally slightly worse to the left. No saddle anesthesias.  No previous spinal procedures to suggest abscess, vaginal d/c or pelvic pain to suggest pelvic process.  I have discussed results with the patient and care plan.  Patient agrees to followup with her primary care physician  Or to return  here if her sx's worsen.      Patient / Family / Caregiver understand and agree with initial ED impression and plan with expectations set for ED visit. Pt stable in ED with no significant deterioration in condition. Pt feels improved after observation and/or treatment in ED.            Clois Treanor L. [place], [place] 06/20/11 1732

## 2011-06-19 LAB — URINE CULTURE
Colony Count: NO GROWTH
Culture  Setup Time: 201304082140

## 2011-08-13 ENCOUNTER — Emergency Department (HOSPITAL_COMMUNITY)
Admission: EM | Admit: 2011-08-13 | Discharge: 2011-08-13 | Disposition: A | Discharge status: Home / Self Care | Payer: Self-pay | Attending: Emergency Medicine | Admitting: Emergency Medicine

## 2011-08-13 ENCOUNTER — Encounter (HOSPITAL_COMMUNITY): Payer: Self-pay | Admitting: Emergency Medicine

## 2011-08-13 DIAGNOSIS — J45909 Unspecified asthma, uncomplicated: Secondary | ICD-10-CM | POA: Insufficient documentation

## 2011-08-13 DIAGNOSIS — R109 Unspecified abdominal pain: Secondary | ICD-10-CM | POA: Insufficient documentation

## 2011-08-13 DIAGNOSIS — F17200 Nicotine dependence, unspecified, uncomplicated: Secondary | ICD-10-CM | POA: Insufficient documentation

## 2011-08-13 LAB — DIFFERENTIAL
Basophils Relative: 0 % (ref 0–1)
Eosinophils Absolute: 0.1 10*3/uL (ref 0.0–0.7)
Eosinophils Relative: 1 % (ref 0–5)
Monocytes Absolute: 0.3 10*3/uL (ref 0.1–1.0)
Monocytes Relative: 6 % (ref 3–12)
Neutrophils Relative %: 40 % — ABNORMAL LOW (ref 43–77)

## 2011-08-13 LAB — URINALYSIS, ROUTINE W REFLEX MICROSCOPIC
Bilirubin Urine: NEGATIVE
Glucose, UA: NEGATIVE mg/dL
Ketones, ur: NEGATIVE mg/dL
Leukocytes, UA: NEGATIVE
Protein, ur: NEGATIVE mg/dL

## 2011-08-13 LAB — COMPREHENSIVE METABOLIC PANEL
ALT: 8 U/L (ref 0–35)
Alkaline Phosphatase: 39 U/L (ref 39–117)
CO2: 22 mEq/L (ref 19–32)
GFR calc Af Amer: >90 mL/min (ref >90)
Glucose, Bld: 88 mg/dL (ref 70–99)
Potassium: 4 mEq/L (ref 3.5–5.1)
Sodium: 138 mEq/L (ref 135–145)
Total Protein: 7 g/dL (ref 6.0–8.3)

## 2011-08-13 LAB — CBC
HCT: 35.3 % — ABNORMAL LOW (ref 36.0–46.0)
Hemoglobin: 11.7 g/dL — ABNORMAL LOW (ref 12.0–15.0)
MCH: 28.7 pg (ref 26.0–34.0)
MCHC: 33.1 g/dL (ref 30.0–36.0)
MCV: 86.7 fL (ref 78.0–100.0)

## 2011-08-13 MED ORDER — OXYCODONE-ACETAMINOPHEN 5-325 MG PO TABS
1.0000 | ORAL_TABLET | Freq: Once | ORAL | Status: AC
  Administered 2011-08-13: 1 via ORAL
  Filled 2011-08-13: qty 1

## 2011-08-13 MED ORDER — DIPHENOXYLATE-ATROPINE 2.5-0.025 MG PO TABS: 1.0000 | ORAL_TABLET | Freq: Four times a day (QID) | ORAL | Status: AC | PRN

## 2011-08-13 NOTE — ED Notes (Signed)
In to discharge pt. Pt refused RX. States she does not have any n/v/or diarrhea and she is not taking medication for something she does not have. Pt refused to take discharge inst or RX. States she would go to [facility]

## 2011-08-13 NOTE — ED Notes (Signed)
Symptoms began last night. Pressure with urination.

## 2011-08-13 NOTE — Discharge Instructions (Signed)
Your blood and urine tests do not show any significant illness.  Use Lomotil for recurrent pain.  Followup with your Dr. if your symptoms.  Last more than 2-3 days.  Return for worse or uncontrolled symptoms

## 2011-08-13 NOTE — ED Provider Notes (Signed)
History   This chart was scribed for Natasha Guppy, MD by Clarita Crane. The patient was seen in room APA07/APA07. Patient's care was started at 0939.    CSN: [id number]  Arrival date & time 08/13/11  4098   First MD Initiated Contact with Patient 08/13/11 1018      Chief Complaint  Patient presents with  . Abdominal Pain  . Urinary Frequency    pressure    (Consider location/radiation/quality/duration/timing/severity/associated sxs/prior treatment) HPI Natasha Bean is a 26 y.o. female who presents to the Emergency Department complaining of constant moderate abdominal pain localized to central upper abdomen onset last night and persistent since with associated sensation of intense pressure with urination. Notes abdominal pain is aggravated by nothing and relieved by nothing. Denies dysuria, hematochezia, nausea, vomiting, diarrhea, fever, chills. LNMP- Aug 07, 2011. Patient with h/o asthma and is a current smoker.  Past Medical History  Diagnosis Date  . Asthma   . Anemia     History reviewed. No pertinent past surgical history.  Family History  Problem Relation Age of Onset  . Cancer Mother   . Hypertension Father     History  Substance Use Topics  . Smoking status: Current Everyday Smoker -- 0.5 packs/day    Types: Cigarettes  . Smokeless tobacco: Not on file  . Alcohol Use: No    OB History    Grav Para Term Preterm Abortions TAB SAB Ect Mult Living                  Review of Systems A complete 10 system review of systems was obtained and all systems are negative except as noted in the HPI and PMH.   Allergies  Review of patient's allergies indicates no known allergies.  Home Medications   Current Outpatient Rx  Name Route Sig Dispense Refill  . ACETAMINOPHEN 500 MG PO TABS Oral Take 500 mg by mouth every 6 (six) hours as needed. pain     . ALBUTEROL SULFATE HFA 108 (90 BASE) MCG/ACT IN AERS Inhalation Inhale 2 puffs into the lungs every 6 (six)  hours as needed. Shortness of breath       BP 122/60  Pulse 63  Resp 18  Ht 5\' 2"  (1.575 m)  Wt 186 lb (84.369 kg)  BMI 34.02 kg/m2  SpO2 100%  LMP 08/07/2011  Physical Exam  Nursing note and vitals reviewed. Constitutional: She is oriented to person, place, and time. She appears well-developed and well-nourished. No distress.  HENT:  Head: Normocephalic and atraumatic.  Eyes: Conjunctivae and EOM are normal.  Neck: Neck supple. No tracheal deviation present.  Cardiovascular: Normal rate, regular rhythm, normal heart sounds and intact distal pulses.   No murmur heard. Pulmonary/Chest: Effort normal and breath sounds normal. No respiratory distress. She has no wheezes. She has no rales.  Abdominal: Soft. Bowel sounds are normal. She exhibits no distension. There is tenderness (mild) in the epigastric area, left upper quadrant and left lower quadrant.       Patient notes tenderness more severe within upper abdomen  Musculoskeletal: Normal range of motion. She exhibits no edema.       Cap refill normal distally.   Neurological: She is alert and oriented to person, place, and time. No sensory deficit.  Skin: Skin is warm and dry.  Psychiatric: She has a normal mood and affect. Her behavior is normal.    ED Course  Procedures (including critical care time) Pressure with urination.  No fever,  no n/v/d. No gyn sxs.  pe mild ttp on left and epigastric area.    DIAGNOSTIC STUDIES: Oxygen Saturation is 100% on room air, normal by my interpretation.    COORDINATION OF CARE: 10:28AM-Patient informed of current plan for treatment and evaluation and agrees with plan at this time.     Labs Reviewed  COMPREHENSIVE METABOLIC PANEL - Abnormal; Notable for the following:    GFR calc non Af Amer 85 (*)    All other components within normal limits  CBC - Abnormal; Notable for the following:    Hemoglobin 11.7 (*)    HCT 35.3 (*)    All other components within normal limits  DIFFERENTIAL  - Abnormal; Notable for the following:    Neutrophils Relative 40 (*)    Lymphocytes Relative 52 (*)    All other components within normal limits  URINALYSIS, ROUTINE W REFLEX MICROSCOPIC  PREGNANCY, URINE  LIPASE, BLOOD   No results found.   No diagnosis found.  12:12 PM Pain improved. She does not want more pain meds.    MDM  Abdominal pain      I personally performed the services described in this documentation, which was scribed in my presence. The recorded information has been reviewed and considered.    Natasha Guppy, MD 08/13/11 1213

## 2011-08-30 ENCOUNTER — Emergency Department (HOSPITAL_COMMUNITY)
Admission: EM | Admit: 2011-08-30 | Discharge: 2011-08-30 | Disposition: A | Discharge status: Home / Self Care | Payer: Self-pay | Attending: Emergency Medicine | Admitting: Emergency Medicine

## 2011-08-30 ENCOUNTER — Encounter (HOSPITAL_COMMUNITY): Payer: Self-pay

## 2011-08-30 DIAGNOSIS — F17200 Nicotine dependence, unspecified, uncomplicated: Secondary | ICD-10-CM | POA: Insufficient documentation

## 2011-08-30 DIAGNOSIS — J45909 Unspecified asthma, uncomplicated: Secondary | ICD-10-CM | POA: Insufficient documentation

## 2011-08-30 DIAGNOSIS — D649 Anemia, unspecified: Secondary | ICD-10-CM | POA: Insufficient documentation

## 2011-08-30 DIAGNOSIS — R51 Headache: Secondary | ICD-10-CM | POA: Insufficient documentation

## 2011-08-30 HISTORY — DX: Migraine, unspecified, not intractable, without status migrainosus: G43.909

## 2011-08-30 MED ORDER — METOCLOPRAMIDE HCL 5 MG/ML IJ SOLN
10.0000 mg | Freq: Once | INTRAMUSCULAR | Status: AC
  Administered 2011-08-30: 10 mg via INTRAVENOUS
  Filled 2011-08-30: qty 2

## 2011-08-30 MED ORDER — DIPHENHYDRAMINE HCL 50 MG/ML IJ SOLN
50.0000 mg | Freq: Once | INTRAMUSCULAR | Status: AC
  Administered 2011-08-30: 50 mg via INTRAVENOUS
  Filled 2011-08-30: qty 1

## 2011-08-30 NOTE — ED Notes (Signed)
Migraine headache for 3 days, denies any n/v, no visual disturbances.  Dizzy today. Taking otc meds w/ no relief

## 2011-08-30 NOTE — Discharge Instructions (Signed)

## 2011-08-30 NOTE — ED Notes (Signed)
Pt states that she has a history of headaches.  States this headache started 3 days ago.  No nausea or vomiting with this current episode.  States she has dizziness with this particular headache and right ear pain.

## 2011-08-30 NOTE — ED Provider Notes (Signed)
History     CSN: [id number]  Arrival date & time 08/30/11  1346   First MD Initiated Contact with Patient 08/30/11 1400      Chief Complaint  Patient presents with  . Migraine     Patient is a 26 y.o. female presenting with migraine. The history is provided by the patient.  Migraine This is a recurrent problem. The current episode started more than 2 days ago. The problem occurs constantly. The problem has been gradually worsening. Associated symptoms include headaches. Pertinent negatives include no chest pain and no shortness of breath. Exacerbated by: light. Nothing relieves the symptoms. She has tried rest for the symptoms. The treatment provided no relief.  pt reports headache for at least 3 days similar to prior episodes of migraine HA was gradual in onset No fever/vomiting/weakness No tick bite or rash reported No head injury reported She reports mild dizziness but is able to ambulate Past Medical History  Diagnosis Date  . Asthma   . Anemia   . Migraine     History reviewed. No pertinent past surgical history.  Family History  Problem Relation Age of Onset  . Cancer Mother   . Hypertension Father     History  Substance Use Topics  . Smoking status: Current Everyday Smoker -- 0.5 packs/day    Types: Cigarettes  . Smokeless tobacco: Not on file  . Alcohol Use: No    OB History    Grav Para Term Preterm Abortions TAB SAB Ect Mult Living                  Review of Systems  Constitutional: Negative for fever.  Respiratory: Negative for shortness of breath.   Cardiovascular: Negative for chest pain.  Neurological: Positive for headaches.  All other systems reviewed and are negative.    Allergies  Review of patient's allergies indicates no known allergies.  Home Medications   Current Outpatient Rx  Name Route Sig Dispense Refill  . ACETAMINOPHEN 500 MG PO TABS Oral Take 500 mg by mouth every 6 (six) hours as needed. pain     . ALBUTEROL SULFATE  HFA 108 (90 BASE) MCG/ACT IN AERS Inhalation Inhale 2 puffs into the lungs every 6 (six) hours as needed. Shortness of breath       BP 123/82  Pulse 92  Temp 98.4 F (36.9 C) (Oral)  Resp 20  Ht 5\' 2"  (1.575 m)  Wt 185 lb (83.915 kg)  BMI 33.84 kg/m2  SpO2 100%  LMP 08/16/2011  Physical Exam CONSTITUTIONAL: Well developed/well nourished HEAD AND FACE: Normocephalic/atraumatic EYES: EOMI/PERRL ENMT: Mucous membranes moist, no bruits noted NECK: supple no meningeal signs SPINE:entire spine nontender CV: S1/S2 noted, no murmurs/rubs/gallops noted LUNGS: Lungs are clear to auscultation bilaterally, no apparent distress ABDOMEN: soft, nontender, no rebound or guarding GU:no cva tenderness NEURO: Awake/alert, facies symmetric, no arm or leg drift is noted Cranial nerves 3/4/5/6/09/17/08/11/12 tested and intact Gait normal No past pointing EXTREMITIES: pulses normal, full ROM SKIN: warm, color normal PSYCH: no abnormalities of mood noted  ED Course  Procedures  Pt with h/o headaches similar to prior doubt acute neurologic process  The patient appears reasonably screened and/or stabilized for discharge and I doubt any other medical condition or other [facility] requiring further screening, evaluation, or treatment in the ED at this time prior to discharge.     MDM  Nursing notes including past medical history and social history reviewed and considered in documentation  Joya Gaskins, MD 08/30/11 [phone]

## 2011-10-19 ENCOUNTER — Emergency Department (HOSPITAL_COMMUNITY)
Admission: EM | Admit: 2011-10-19 | Discharge: 2011-10-19 | Disposition: A | Discharge status: Home / Self Care | Payer: Self-pay | Attending: Emergency Medicine | Admitting: Emergency Medicine

## 2011-10-19 ENCOUNTER — Encounter (HOSPITAL_COMMUNITY): Payer: Self-pay | Admitting: *Deleted

## 2011-10-19 DIAGNOSIS — F17200 Nicotine dependence, unspecified, uncomplicated: Secondary | ICD-10-CM | POA: Insufficient documentation

## 2011-10-19 DIAGNOSIS — Z888 Allergy status to other drugs, medicaments and biological substances status: Secondary | ICD-10-CM | POA: Insufficient documentation

## 2011-10-19 DIAGNOSIS — J45909 Unspecified asthma, uncomplicated: Secondary | ICD-10-CM | POA: Insufficient documentation

## 2011-10-19 DIAGNOSIS — R51 Headache: Secondary | ICD-10-CM | POA: Insufficient documentation

## 2011-10-19 DIAGNOSIS — Z79899 Other long term (current) drug therapy: Secondary | ICD-10-CM | POA: Insufficient documentation

## 2011-10-19 MED ORDER — MORPHINE SULFATE 4 MG/ML IJ SOLN
4.0000 mg | Freq: Once | INTRAMUSCULAR | Status: AC
  Administered 2011-10-19: 4 mg via INTRAMUSCULAR

## 2011-10-19 MED ORDER — MORPHINE SULFATE 4 MG/ML IJ SOLN
4.0000 mg | Freq: Once | INTRAMUSCULAR | Status: DC
  Filled 2011-10-19: qty 1

## 2011-10-19 MED ORDER — BUTALBITAL-ASA-CAFF-CODEINE 50-325-40-30 MG PO CAPS: 1.0000 | ORAL_CAPSULE | ORAL | Status: AC | PRN

## 2011-10-19 MED ORDER — PROMETHAZINE HCL 12.5 MG PO TABS
25.0000 mg | ORAL_TABLET | Freq: Once | ORAL | Status: AC
  Administered 2011-10-19: 25 mg via ORAL
  Filled 2011-10-19: qty 2

## 2011-10-19 NOTE — ED Provider Notes (Signed)
History     CSN: [id number]  Arrival date & time 10/19/11  1510   First MD Initiated Contact with Patient 10/19/11 1604      Chief Complaint  Patient presents with  . Headache    (Consider location/radiation/quality/duration/timing/severity/associated sxs/prior treatment) Patient is a 26 y.o. female presenting with headaches. The history is provided by the patient.  Headache  This is a chronic problem. The current episode started 2 days ago. The problem occurs hourly. The problem has been gradually worsening. The headache is associated with bright light. The pain is located in the right unilateral and temporal region. The quality of the pain is described as throbbing. The pain is moderate. The pain does not radiate. Associated symptoms include nausea. Pertinent negatives include no fever, no palpitations, no syncope, no shortness of breath and no vomiting. She has tried acetaminophen for the symptoms. The treatment provided no relief.    Past Medical History  Diagnosis Date  . Asthma   . Anemia   . Migraine     History reviewed. No pertinent past surgical history.  Family History  Problem Relation Age of Onset  . Cancer Mother   . Hypertension Father     History  Substance Use Topics  . Smoking status: Current Everyday Smoker -- 0.5 packs/day    Types: Cigarettes  . Smokeless tobacco: Not on file  . Alcohol Use: No    OB History    Grav Para Term Preterm Abortions TAB SAB Ect Mult Living                  Review of Systems  Constitutional: Negative for fever and activity change.       All ROS Neg except as noted in HPI  HENT: Negative for nosebleeds and neck pain.   Eyes: Negative for photophobia and discharge.  Respiratory: Negative for cough, shortness of breath and wheezing.   Cardiovascular: Negative for chest pain, palpitations and syncope.  Gastrointestinal: Positive for nausea. Negative for vomiting, abdominal pain and blood in stool.  Genitourinary:  Negative for dysuria, frequency and hematuria.  Musculoskeletal: Negative for back pain and arthralgias.  Skin: Negative.   Neurological: Positive for headaches. Negative for dizziness, seizures and speech difficulty.  Psychiatric/Behavioral: Negative for hallucinations and confusion.    Allergies  Benadryl  Home Medications   Current Outpatient Rx  Name Route Sig Dispense Refill  . ACETAMINOPHEN 500 MG PO TABS Oral Take 1,000 mg by mouth 2 (two) times daily as needed. pain    . ALBUTEROL SULFATE HFA 108 (90 BASE) MCG/ACT IN AERS Inhalation Inhale 2 puffs into the lungs every 6 (six) hours as needed. Shortness of breath       BP 137/74  Pulse 76  Temp 98.3 F (36.8 C) (Oral)  Resp 18  Ht 5\' 2"  (1.575 m)  Wt 178 lb 2 oz (80.797 kg)  BMI 32.58 kg/m2  SpO2 100%  LMP 09/17/2011  Physical Exam  Nursing note and vitals reviewed. Constitutional: She is oriented to person, place, and time. She appears well-developed and well-nourished.  Non-toxic appearance.  HENT:  Head: Normocephalic.  Right Ear: Tympanic membrane and external ear normal.  Left Ear: Tympanic membrane and external ear normal.  Eyes: EOM and lids are normal. Pupils are equal, round, and reactive to light.  Neck: Normal range of motion. Neck supple. Carotid bruit is not present.  Cardiovascular: Normal rate, regular rhythm, normal heart sounds, intact distal pulses and normal pulses.   Pulmonary/Chest: Breath  sounds normal. No respiratory distress.  Abdominal: Soft. Bowel sounds are normal. There is no tenderness. There is no guarding.  Musculoskeletal: Normal range of motion.  Lymphadenopathy:       Head (right side): No submandibular adenopathy present.       Head (left side): No submandibular adenopathy present.    She has no cervical adenopathy.  Neurological: She is alert and oriented to person, place, and time. She has normal strength. No cranial nerve deficit or sensory deficit. She exhibits normal muscle  tone. Coordination normal.       Grip symmetrical. Gait wnl. Speech clear.  Skin: Skin is warm and dry.  Psychiatric: She has a normal mood and affect. Her speech is normal.    ED Course  Procedures (including critical care time)  Labs Reviewed - No data to display No results found. Pulse Ox 100% on room air. WNL by my interpretation.  No diagnosis found.    MDM  I have reviewed nursing notes, vital signs, and all appropriate lab and imaging results for this patient. Pt c/o 2 days of right temporal headache. Nausea but no vomiting. Pt treated with IM Morphine and po promethazine. Rx for fiorinal #3 given.       Kathie Dike, [place] 10/19/11 1626

## 2011-10-19 NOTE — ED Notes (Signed)
Pt with right sided HA x 3 days, has an appt with health dept on 8/18, denies sensitivity to light

## 2011-10-19 NOTE — ED Notes (Signed)
Headache, nausea, no vomiting  Dizzy at times.  No head injury

## 2011-10-20 NOTE — ED Provider Notes (Signed)
Medical screening examination/treatment/procedure(s) were performed by non-physician practitioner and as supervising physician I was immediately available for consultation/collaboration. Drinda Belgard, MD, FACEP   Jayron Maqueda L Leslee Haueter, MD 10/20/11 0944 

## 2011-11-01 ENCOUNTER — Emergency Department (HOSPITAL_COMMUNITY)
Admission: EM | Admit: 2011-11-01 | Discharge: 2011-11-01 | Disposition: A | Discharge status: Home / Self Care | Payer: Self-pay | Attending: Emergency Medicine | Admitting: Emergency Medicine

## 2011-11-01 ENCOUNTER — Encounter (HOSPITAL_COMMUNITY): Payer: Self-pay | Admitting: *Deleted

## 2011-11-01 DIAGNOSIS — J45909 Unspecified asthma, uncomplicated: Secondary | ICD-10-CM | POA: Insufficient documentation

## 2011-11-01 DIAGNOSIS — D649 Anemia, unspecified: Secondary | ICD-10-CM | POA: Insufficient documentation

## 2011-11-01 DIAGNOSIS — K0889 Other specified disorders of teeth and supporting structures: Secondary | ICD-10-CM

## 2011-11-01 DIAGNOSIS — K029 Dental caries, unspecified: Secondary | ICD-10-CM | POA: Insufficient documentation

## 2011-11-01 DIAGNOSIS — R51 Headache: Secondary | ICD-10-CM | POA: Insufficient documentation

## 2011-11-01 DIAGNOSIS — F17200 Nicotine dependence, unspecified, uncomplicated: Secondary | ICD-10-CM | POA: Insufficient documentation

## 2011-11-01 MED ORDER — HYDROCODONE-ACETAMINOPHEN 5-325 MG PO TABS
1.0000 | ORAL_TABLET | Freq: Once | ORAL | Status: AC
  Administered 2011-11-01: 1 via ORAL
  Filled 2011-11-01: qty 1

## 2011-11-01 MED ORDER — AMOXICILLIN 500 MG PO CAPS: 500.0000 mg | ORAL_CAPSULE | Freq: Three times a day (TID) | ORAL | Status: AC

## 2011-11-01 MED ORDER — HYDROCODONE-ACETAMINOPHEN 5-325 MG PO TABS: 1.0000 | ORAL_TABLET | ORAL | Status: AC | PRN

## 2011-11-01 MED ORDER — KETOROLAC TROMETHAMINE 60 MG/2ML IM SOLN
60.0000 mg | Freq: Once | INTRAMUSCULAR | Status: AC
  Administered 2011-11-01: 60 mg via INTRAMUSCULAR
  Filled 2011-11-01: qty 2

## 2011-11-01 NOTE — ED Notes (Signed)
Patient with no complaints at this time. Respirations even and unlabored. Skin warm/dry. Discharge instructions reviewed with patient at this time. Patient given opportunity to voice concerns/ask questions. Patient discharged at this time and left Emergency Department with steady gait.   

## 2011-11-01 NOTE — ED Notes (Signed)
Pt c/o right upper toothache since last night. Also c/o headache.

## 2011-11-06 NOTE — ED Provider Notes (Signed)
History     CSN: [id number]  Arrival date & time 11/01/11  1415   First MD Initiated Contact with Patient 11/01/11 1449      Chief Complaint  Patient presents with  . Dental Pain    (Consider location/radiation/quality/duration/timing/severity/associated sxs/prior treatment) HPI Comments: Taylia A Laubacher  presents with a 1 day history of dental pain.   The patient has a history of injury to the tooth involved which has recently started to cause pain.  There has been no fevers,  Chills, nausea or vomiting, also no complaint of difficulty swallowing,  Although chewing makes pain worse.  She states the dental pain has triggered her "migraine" headache.  The patient has tried tylenol without relief of symptoms.  She denies nausea, vomiting, fevers, dizziness, and localized  Numbness or weakness.  She also denies photophobia which generally comes with her migraines.  The headache is on her right side which is also the side her tooth ache resides.    The history is provided by the patient.    Past Medical History  Diagnosis Date  . Asthma   . Anemia   . Migraine     History reviewed. No pertinent past surgical history.  Family History  Problem Relation Age of Onset  . Cancer Mother   . Hypertension Father     History  Substance Use Topics  . Smoking status: Current Everyday Smoker -- 0.5 packs/day    Types: Cigarettes  . Smokeless tobacco: Not on file  . Alcohol Use: No    OB History    Grav Para Term Preterm Abortions TAB SAB Ect Mult Living                  Review of Systems  Constitutional: Negative for fever.  HENT: Positive for dental problem. Negative for sore throat, facial swelling, neck pain and neck stiffness.   Respiratory: Negative for shortness of breath.     Allergies  Benadryl  Home Medications   Current Outpatient Rx  Name Route Sig Dispense Refill  . ACETAMINOPHEN 500 MG PO TABS Oral Take 1,000 mg by mouth 2 (two) times daily as needed.  pain    . ALBUTEROL SULFATE HFA 108 (90 BASE) MCG/ACT IN AERS Inhalation Inhale 2 puffs into the lungs every 6 (six) hours as needed. Shortness of breath     . AMOXICILLIN 500 MG PO CAPS Oral Take 1 capsule (500 mg total) by mouth 3 (three) times daily. 30 capsule 0  . HYDROCODONE-ACETAMINOPHEN 5-325 MG PO TABS Oral Take 1 tablet by mouth every 4 (four) hours as needed for pain. 20 tablet 0    BP 125/73  Pulse 85  Temp 98.7 F (37.1 C) (Oral)  Resp 16  Ht 5\' 2"  (1.575 m)  Wt 178 lb (80.74 kg)  BMI 32.56 kg/m2  SpO2 99%  LMP 09/21/2011  Physical Exam  Nursing note and vitals reviewed. Constitutional: She is oriented to person, place, and time. She appears well-developed and well-nourished. No distress.       Uncomfortable appearing  HENT:  Head: Normocephalic and atraumatic. No trismus in the jaw.  Right Ear: Tympanic membrane and external ear normal.  Left Ear: Tympanic membrane and external ear normal.  Nose: No mucosal edema, rhinorrhea or sinus tenderness.  Mouth/Throat: Oropharynx is clear and moist and mucous membranes are normal. No oral lesions. Dental caries present. No uvula swelling.         Cavity noted with mild surrounding gingival erythema.  No fluctuance or swelling  Eyes: Conjunctivae and EOM are normal. Pupils are equal, round, and reactive to light.  Neck: Normal range of motion. Neck supple.  Cardiovascular: Normal rate and normal heart sounds.   Pulmonary/Chest: Effort normal.  Abdominal: Soft. She exhibits no distension. There is no tenderness.  Musculoskeletal: Normal range of motion.  Lymphadenopathy:    She has no cervical adenopathy.  Neurological: She is alert and oriented to person, place, and time. She has normal strength. No sensory deficit. Gait normal. GCS eye subscore is 4. GCS verbal subscore is 5. GCS motor subscore is 6.       Normal heel-shin, normal rapid alternating movements. Cranial nerves III-XII intact.  No pronator drift.  Skin: Skin  is warm and dry. No rash noted. No erythema.  Psychiatric: She has a normal mood and affect. Her speech is normal and behavior is normal. Thought content normal. Cognition and memory are normal.    ED Course  Procedures (including critical care time)  Labs Reviewed - No data to display No results found.   1. Pain, dental   2. Chronic headache       MDM  Amoxil, hydrocodone prescribed.  Referral to health dept,  Also given dental referral list.  No neuro deficit on exam or by history to suggest emergent presentation.           Burgess Amor, [place] 11/06/11 1304

## 2011-11-06 NOTE — ED Provider Notes (Signed)
Medical screening examination/treatment/procedure(s) were performed by non-physician practitioner and as supervising physician I was immediately available for consultation/collaboration.   Carleene Cooper III, MD 11/06/11 [phone]

## 2011-11-19 ENCOUNTER — Encounter (HOSPITAL_COMMUNITY): Payer: Self-pay | Admitting: Emergency Medicine

## 2011-11-19 ENCOUNTER — Emergency Department (HOSPITAL_COMMUNITY): Payer: Self-pay

## 2011-11-19 ENCOUNTER — Emergency Department (HOSPITAL_COMMUNITY)
Admission: EM | Admit: 2011-11-19 | Discharge: 2011-11-19 | Disposition: A | Discharge status: Home / Self Care | Payer: Self-pay | Attending: Emergency Medicine | Admitting: Emergency Medicine

## 2011-11-19 DIAGNOSIS — F17200 Nicotine dependence, unspecified, uncomplicated: Secondary | ICD-10-CM | POA: Insufficient documentation

## 2011-11-19 DIAGNOSIS — R109 Unspecified abdominal pain: Secondary | ICD-10-CM

## 2011-11-19 DIAGNOSIS — J45909 Unspecified asthma, uncomplicated: Secondary | ICD-10-CM | POA: Insufficient documentation

## 2011-11-19 DIAGNOSIS — R1031 Right lower quadrant pain: Secondary | ICD-10-CM | POA: Insufficient documentation

## 2011-11-19 LAB — URINALYSIS, ROUTINE W REFLEX MICROSCOPIC
Bilirubin Urine: NEGATIVE
Hgb urine dipstick: NEGATIVE
Ketones, ur: NEGATIVE mg/dL
Urobilinogen, UA: 0.2 mg/dL (ref 0.0–1.0)

## 2011-11-19 LAB — BASIC METABOLIC PANEL
BUN: 9 mg/dL (ref 6–23)
CO2: 20 mEq/L (ref 19–32)
Chloride: 107 mEq/L (ref 96–112)
Creatinine, Ser: 0.86 mg/dL (ref 0.50–1.10)
Potassium: 3.7 mEq/L (ref 3.5–5.1)

## 2011-11-19 LAB — CBC
HCT: 34.1 % — ABNORMAL LOW (ref 36.0–46.0)
MCV: 86.8 fL (ref 78.0–100.0)
RBC: 3.93 MIL/uL (ref 3.87–5.11)
WBC: 4.5 10*3/uL (ref 4.0–10.5)

## 2011-11-19 LAB — URINE MICROSCOPIC-ADD ON

## 2011-11-19 MED ORDER — OXYCODONE-ACETAMINOPHEN 5-325 MG PO TABS: 1.0000 | ORAL_TABLET | Freq: Four times a day (QID) | ORAL | Status: AC | PRN

## 2011-11-19 MED ORDER — ONDANSETRON HCL 8 MG PO TABS: 8.0000 mg | ORAL_TABLET | ORAL | Status: AC | PRN

## 2011-11-19 MED ORDER — SODIUM CHLORIDE 0.9 % IV SOLN
INTRAVENOUS | Status: DC
  Administered 2011-11-19: 10:00:00 via INTRAVENOUS

## 2011-11-19 MED ORDER — HYDROMORPHONE HCL PF 1 MG/ML IJ SOLN
1.0000 mg | Freq: Once | INTRAMUSCULAR | Status: AC
  Administered 2011-11-19: 1 mg via INTRAVENOUS
  Filled 2011-11-19: qty 1

## 2011-11-19 MED ORDER — ONDANSETRON HCL 4 MG/2ML IJ SOLN
4.0000 mg | Freq: Once | INTRAMUSCULAR | Status: AC
  Administered 2011-11-19: 4 mg via INTRAVENOUS
  Filled 2011-11-19: qty 2

## 2011-11-19 MED ORDER — SODIUM CHLORIDE 0.9 % IV BOLUS (SEPSIS)
500.0000 mL | Freq: Once | INTRAVENOUS | Status: AC
  Administered 2011-11-19: 500 mL via INTRAVENOUS

## 2011-11-19 NOTE — ED Provider Notes (Signed)
History   This chart was scribed for Donnetta Hutching, MD by Gerlean Ren. This patient was seen in room [facility] and the patient's care was started at 10:00AM.   CSN: [id number]  Arrival date & time 11/19/11  0827   First MD Initiated Contact with Patient 11/19/11 [phone]      Chief Complaint  Patient presents with  . Flank Pain    (Consider location/radiation/quality/duration/timing/severity/associated sxs/prior treatment) The history is provided by the patient. No language interpreter was used.   Natasha Bean is a 26 y.o. female who presents to the Emergency Department complaining of 24 hours of constant, gradually worsening acing RLQ pain radiating to right flank and right anterior lateral thigh that has worsened in past 6 hours.  Pt reports pain worsened by ambulation.  Pt reports associated pressure when urinating, but denies frequency.  Pt denies vaginal bleeding or discharge.  Pt reports LNMP 10/22/2011.  Pt reports sexual activity and uses birth control.  Pt reports an associated decrease in appetite.  Pt reports having UTI previously, but says it was a long time ago.  Pt is a current everyday smoker (0.5packs/day) but denies alcohol use.    Past Medical History  Diagnosis Date  . Asthma   . Anemia   . Migraine     History reviewed. No pertinent past surgical history.  Family History  Problem Relation Age of Onset  . Cancer Mother   . Hypertension Father     History  Substance Use Topics  . Smoking status: Current Everyday Smoker -- 0.5 packs/day    Types: Cigarettes  . Smokeless tobacco: Not on file  . Alcohol Use: No    No OB history provided.  Review of Systems  All other systems reviewed and are negative.    Allergies  Benadryl  Home Medications   Current Outpatient Rx  Name Route Sig Dispense Refill  . ACETAMINOPHEN 500 MG PO TABS Oral Take 1,000 mg by mouth 2 (two) times daily as needed. pain    . ALBUTEROL SULFATE HFA 108 (90 BASE) MCG/ACT IN AERS  Inhalation Inhale 2 puffs into the lungs every 6 (six) hours as needed. Shortness of breath       BP 120/78  Pulse 61  Temp 98.4 F (36.9 C) (Oral)  Resp 17  Ht 5\' 2"  (1.575 m)  Wt 178 lb (80.74 kg)  BMI 32.56 kg/m2  SpO2 100%  LMP 10/22/2011  Physical Exam  Nursing note and vitals reviewed. Constitutional: She is oriented to person, place, and time. She appears well-developed and well-nourished.  HENT:  Head: Normocephalic and atraumatic.  Eyes: Conjunctivae and EOM are normal. Pupils are equal, round, and reactive to light.  Neck: Normal range of motion. Neck supple.  Cardiovascular: Normal rate, regular rhythm and normal heart sounds.   Pulmonary/Chest: Effort normal and breath sounds normal.  Abdominal: Soft. There is tenderness.  Musculoskeletal: Normal range of motion.  Neurological: She is alert and oriented to person, place, and time.  Skin: Skin is warm and dry.  Psychiatric: She has a normal mood and affect.    ED Course  Procedures (including critical care time) DIAGNOSTIC STUDIES: Oxygen Saturation is 100% on room air, normal by my interpretation.    COORDINATION OF CARE: 9:34AM- Ordered abdominal CT scan, pain meds, blood and urine analyses, and pregnancy test.  Discussed possible appendicitis or UTI.   Labs Reviewed  URINALYSIS, ROUTINE W REFLEX MICROSCOPIC - Abnormal; Notable for the following:    Protein, ur  TRACE (*)     Leukocytes, UA TRACE (*)     All other components within normal limits  URINE MICROSCOPIC-ADD ON - Abnormal; Notable for the following:    Squamous Epithelial / LPF MANY (*)     Bacteria, UA FEW (*)     All other components within normal limits  CBC - Abnormal; Notable for the following:    Hemoglobin 11.7 (*)     HCT 34.1 (*)     All other components within normal limits  PREGNANCY, URINE  BASIC METABOLIC PANEL   No results found.   No diagnosis found.  Results for orders placed during the hospital encounter of 11/19/11    URINALYSIS, ROUTINE W REFLEX MICROSCOPIC      Component Value Range   Color, Urine YELLOW  YELLOW   APPearance CLEAR  CLEAR   Specific Gravity, Urine 1.030  1.005 - 1.030   pH 5.5  5.0 - 8.0   Glucose, UA NEGATIVE  NEGATIVE mg/dL   Hgb urine dipstick NEGATIVE  NEGATIVE   Bilirubin Urine NEGATIVE  NEGATIVE   Ketones, ur NEGATIVE  NEGATIVE mg/dL   Protein, ur TRACE (*) NEGATIVE mg/dL   Urobilinogen, UA 0.2  0.0 - 1.0 mg/dL   Nitrite NEGATIVE  NEGATIVE   Leukocytes, UA TRACE (*) NEGATIVE  PREGNANCY, URINE      Component Value Range   Preg Test, Ur NEGATIVE  NEGATIVE  URINE MICROSCOPIC-ADD ON      Component Value Range   Squamous Epithelial / LPF MANY (*) RARE   WBC, UA 3-6  <3 WBC/hpf   Bacteria, UA FEW (*) RARE  CBC      Component Value Range   WBC 4.5  4.0 - 10.5 K/uL   RBC 3.93  3.87 - 5.11 MIL/uL   Hemoglobin 11.7 (*) 12.0 - 15.0 g/dL   HCT 96.0 (*) 45.4 - 09.8 %   MCV 86.8  78.0 - 100.0 fL   MCH 29.8  26.0 - 34.0 pg   MCHC 34.3  30.0 - 36.0 g/dL   RDW 11.9  14.7 - 82.9 %   Platelets 253  150 - 400 K/uL  BASIC METABOLIC PANEL      Component Value Range   Sodium 136  135 - 145 mEq/L   Potassium 3.7  3.5 - 5.1 mEq/L   Chloride 107  96 - 112 mEq/L   CO2 20  19 - 32 mEq/L   Glucose, Bld 99  70 - 99 mg/dL   BUN 9  6 - 23 mg/dL   Creatinine, Ser 5.62  0.50 - 1.10 mg/dL   Calcium 8.9  8.4 - 13.0 mg/dL   GFR calc non Af Amer >90  >90 mL/min   GFR calc Af Amer >90  >90 mL/min   No results found. Ct Abdomen Pelvis W Contrast  11/19/2011  *RADIOLOGY REPORT*  Clinical Data: Right lower quadrant pain.  CT ABDOMEN AND PELVIS WITH CONTRAST  Technique:  Multidetector CT imaging of the abdomen and pelvis was performed following the standard protocol during bolus administration of intravenous contrast.  Contrast:   100 ml Omnipaque 300 IV.  Comparison: 10/30/2007  Findings: Minimal linear densities in the lung bases, atelectasis or scarring.  No effusions.  Heart is normal size.   Appendix is visualized and is normal.  Stomach, large and small bowel grossly unremarkable.  Small amount of free fluid in the pelvis.  There is fluid within the endometrium.  No adnexal masses. Urinary bladder  is unremarkable.  Liver, gallbladder, spleen, pancreas, adrenals and kidneys are unremarkable.  No free air or adenopathy.  No acute bony abnormality.  IMPRESSION: No acute findings in the abdomen or pelvis.   Original Report Authenticated By: Cyndie Chime, M.D.       MDM  nontender over McBurney's point.  Labs and CT scan all normal. No vaginal discharge or bleeding. Urine culture pending. Patient feeling much better after IV fluids. No acute abdomen      I personally performed the services described in this documentation, which was scribed in my presence. The recorded information has been reviewed and considered.    Donnetta Hutching, MD 11/19/11 1558

## 2011-11-19 NOTE — ED Notes (Addendum)
C/o RLQ pain and right flank pain onset this morning; states pain radiates down RLE; c/o pain worse with urination and states she feels like she has a lot of "pressure" in her lower abd, more so when urinating. Denies n/v/d.  Reports pain is also worse with movement and ambulation.

## 2011-11-19 NOTE — ED Notes (Signed)
Pt c/o rlq, r side pain radiates down r leg. Pain worse with movement. C/o pressure to rlq area. deneis n/v/d. Denies pain with urination but feels pressure. Last normal bm yesterday-denies black or bloody. Pt slightly tearful. nad at this time. Denies vag d/c.

## 2011-11-21 LAB — URINE CULTURE: Colony Count: 65000

## 2011-12-04 ENCOUNTER — Encounter (HOSPITAL_COMMUNITY): Payer: Self-pay

## 2011-12-04 ENCOUNTER — Emergency Department (HOSPITAL_COMMUNITY)
Admission: EM | Admit: 2011-12-04 | Discharge: 2011-12-04 | Disposition: A | Discharge status: Home / Self Care | Payer: Self-pay | Attending: Emergency Medicine | Admitting: Emergency Medicine

## 2011-12-04 DIAGNOSIS — G43909 Migraine, unspecified, not intractable, without status migrainosus: Secondary | ICD-10-CM | POA: Insufficient documentation

## 2011-12-04 DIAGNOSIS — Z888 Allergy status to other drugs, medicaments and biological substances status: Secondary | ICD-10-CM | POA: Insufficient documentation

## 2011-12-04 DIAGNOSIS — Z8249 Family history of ischemic heart disease and other diseases of the circulatory system: Secondary | ICD-10-CM | POA: Insufficient documentation

## 2011-12-04 DIAGNOSIS — Z809 Family history of malignant neoplasm, unspecified: Secondary | ICD-10-CM | POA: Insufficient documentation

## 2011-12-04 DIAGNOSIS — J45909 Unspecified asthma, uncomplicated: Secondary | ICD-10-CM | POA: Insufficient documentation

## 2011-12-04 DIAGNOSIS — F17200 Nicotine dependence, unspecified, uncomplicated: Secondary | ICD-10-CM | POA: Insufficient documentation

## 2011-12-04 MED ORDER — BUTALBITAL-APAP-CAFFEINE 50-325-40 MG PO TABS: 1.0000 | ORAL_TABLET | Freq: Four times a day (QID) | ORAL | Status: DC | PRN

## 2011-12-04 MED ORDER — KETOROLAC TROMETHAMINE 60 MG/2ML IM SOLN: 60.0000 mg | Freq: Once | INTRAMUSCULAR | Status: DC

## 2011-12-04 MED ORDER — PROMETHAZINE HCL 25 MG/ML IJ SOLN
25.0000 mg | Freq: Once | INTRAMUSCULAR | Status: AC
  Administered 2011-12-04: 25 mg via INTRAMUSCULAR
  Filled 2011-12-04: qty 1

## 2011-12-04 MED ORDER — HYDROMORPHONE HCL PF 1 MG/ML IJ SOLN
1.0000 mg | Freq: Once | INTRAMUSCULAR | Status: AC
  Administered 2011-12-04: 1 mg via INTRAMUSCULAR
  Filled 2011-12-04: qty 1

## 2011-12-04 MED ORDER — KETOROLAC TROMETHAMINE 60 MG/2ML IM SOLN
60.0000 mg | Freq: Once | INTRAMUSCULAR | Status: AC
  Administered 2011-12-04: 60 mg via INTRAMUSCULAR
  Filled 2011-12-04: qty 2

## 2011-12-04 MED ORDER — HYDROCODONE-ACETAMINOPHEN 5-325 MG PO TABS: 1.0000 | ORAL_TABLET | Freq: Four times a day (QID) | ORAL | Status: AC | PRN

## 2011-12-04 NOTE — ED Notes (Signed)
Headache, and nausea, Pt says it feels like migraines she has had in the past. No hx of HI,

## 2011-12-04 NOTE — ED Notes (Signed)
Pt c/o headache and nausea x 3 days.  Denies vomiting.  Reports history of migraines.  Says her migraines are getting worse.

## 2011-12-04 NOTE — ED Provider Notes (Signed)
History     CSN: [id number]  Arrival date & time 12/04/11  1359   First MD Initiated Contact with Patient 12/04/11 1441      Chief Complaint  Patient presents with  . Headache    (Consider location/radiation/quality/duration/timing/severity/associated sxs/prior treatment) HPI Comments: Pain typical of previous migraines.  No trauma.  Pt is currently getting established at the [facility].  Patient is a 26 y.o. female presenting with headaches. The history is provided by the patient. No language interpreter was used.  Headache  This is a new problem. Episode onset: 3 days ago. The problem occurs constantly. The problem has not changed since onset.The headache is associated with bright light. The pain is located in the right unilateral region. The quality of the pain is described as throbbing. The pain is severe. The pain does not radiate. Associated symptoms include nausea. Pertinent negatives include no fever and no vomiting. She has tried acetaminophen for the symptoms. The treatment provided no relief.    Past Medical History  Diagnosis Date  . Asthma   . Anemia   . Migraine     History reviewed. No pertinent past surgical history.  Family History  Problem Relation Age of Onset  . Cancer Mother   . Hypertension Father     History  Substance Use Topics  . Smoking status: Current Every Day Smoker -- 0.5 packs/day    Types: Cigarettes  . Smokeless tobacco: Not on file  . Alcohol Use: No    OB History    Grav Para Term Preterm Abortions TAB SAB Ect Mult Living                  Review of Systems  Constitutional: Negative for fever and chills.  Eyes: Positive for photophobia.  Gastrointestinal: Positive for nausea. Negative for vomiting.  Neurological: Positive for headaches.  All other systems reviewed and are negative.    Allergies  Benadryl  Home Medications   Current Outpatient Rx  Name Route Sig Dispense Refill  . ACETAMINOPHEN 500 MG PO TABS Oral Take  1,000 mg by mouth every 8 (eight) hours as needed. pain    . IBUPROFEN 200 MG PO TABS Oral Take 400 mg by mouth every 6 (six) hours as needed. Pain    . ALBUTEROL SULFATE HFA 108 (90 BASE) MCG/ACT IN AERS Inhalation Inhale 2 puffs into the lungs every 6 (six) hours as needed. Shortness of breath     . BUTALBITAL-APAP-CAFFEINE 50-325-40 MG PO TABS Oral Take 1 tablet by mouth every 6 (six) hours as needed for headache. 20 tablet 0    BP 133/87  Pulse 90  Temp 98.1 F (36.7 C) (Oral)  Resp 20  Ht 5\' 2"  (1.575 m)  Wt 170 lb (77.111 kg)  BMI 31.09 kg/m2  SpO2 100%  LMP 11/24/2011  Physical Exam  Nursing note and vitals reviewed. Constitutional: She is oriented to person, place, and time. She appears well-developed and well-nourished. No distress.  HENT:  Head: Normocephalic and atraumatic.  Eyes: Conjunctivae normal and EOM are normal. Pupils are equal, round, and reactive to light.  Neck: Normal range of motion.  Cardiovascular: Normal rate, regular rhythm and normal heart sounds.   Pulmonary/Chest: Effort normal and breath sounds normal.  Abdominal: Soft. She exhibits no distension. There is no tenderness.  Musculoskeletal: Normal range of motion.  Neurological: She is alert and oriented to person, place, and time. She has normal strength. No cranial nerve deficit or sensory deficit. Coordination and gait  normal. GCS eye subscore is 4. GCS verbal subscore is 5. GCS motor subscore is 6.  Skin: Skin is warm and dry.  Psychiatric: She has a normal mood and affect. Judgment normal.    ED Course  Procedures (including critical care time)  Labs Reviewed - No data to display No results found.   1. Migraine       MDM  no acute neuro findings clinically.  rx butalbitol, 20 F/u with [facility] prn          Evalina Field, [place] 12/04/11 1455

## 2011-12-04 NOTE — ED Provider Notes (Signed)
Medical screening examination/treatment/procedure(s) were performed by non-physician practitioner and as supervising physician I was immediately available for consultation/collaboration.   Kaylor Simenson L Julia Alkhatib, MD 12/04/11 1539 

## 2011-12-14 ENCOUNTER — Encounter (HOSPITAL_COMMUNITY): Payer: Self-pay

## 2011-12-14 ENCOUNTER — Emergency Department (HOSPITAL_COMMUNITY)
Admission: EM | Admit: 2011-12-14 | Discharge: 2011-12-14 | Disposition: A | Discharge status: Home / Self Care | Payer: Self-pay | Attending: Emergency Medicine | Admitting: Emergency Medicine

## 2011-12-14 DIAGNOSIS — J45909 Unspecified asthma, uncomplicated: Secondary | ICD-10-CM | POA: Insufficient documentation

## 2011-12-14 DIAGNOSIS — F17200 Nicotine dependence, unspecified, uncomplicated: Secondary | ICD-10-CM | POA: Insufficient documentation

## 2011-12-14 DIAGNOSIS — R51 Headache: Secondary | ICD-10-CM | POA: Insufficient documentation

## 2011-12-14 MED ORDER — HYDROCODONE-ACETAMINOPHEN 5-325 MG PO TABS: 1.0000 | ORAL_TABLET | ORAL | Status: DC | PRN

## 2011-12-14 NOTE — ED Provider Notes (Signed)
Medical screening examination/treatment/procedure(s) were performed by non-physician practitioner and as supervising physician I was immediately available for consultation/collaboration.  Flint Melter, MD 12/14/11 [phone]

## 2011-12-14 NOTE — ED Notes (Signed)
Pt c/o migraine headache since yesterday.  Denies any n/v.

## 2011-12-14 NOTE — ED Provider Notes (Signed)
History     CSN: [id number]  Arrival date & time 12/14/11  0808   First MD Initiated Contact with Patient 12/14/11 [phone]      Chief Complaint  Patient presents with  . Headache    (Consider location/radiation/quality/duration/timing/severity/associated sxs/prior treatment) Patient is a 26 y.o. female presenting with headaches. The history is provided by the patient.  Headache  This is a chronic problem. The current episode started yesterday. The problem occurs hourly. The problem has been gradually worsening. The headache is associated with an unknown factor. The pain is located in the right unilateral region. The quality of the pain is described as sharp (burning sensation). The pain is moderate. The pain does not radiate. Pertinent negatives include no fever, no palpitations, no syncope, no shortness of breath and no vomiting. She has tried acetaminophen for the symptoms.    Past Medical History  Diagnosis Date  . Asthma   . Anemia   . Migraine     History reviewed. No pertinent past surgical history.  Family History  Problem Relation Age of Onset  . Cancer Mother   . Hypertension Father     History  Substance Use Topics  . Smoking status: Current Every Day Smoker -- 0.5 packs/day    Types: Cigarettes  . Smokeless tobacco: Not on file  . Alcohol Use: No    OB History    Grav Para Term Preterm Abortions TAB SAB Ect Mult Living                  Review of Systems  Constitutional: Negative for fever and activity change.       All ROS Neg except as noted in HPI  HENT: Negative for nosebleeds and neck pain.   Eyes: Negative for photophobia and discharge.  Respiratory: Positive for wheezing. Negative for cough and shortness of breath.   Cardiovascular: Negative for chest pain, palpitations and syncope.  Gastrointestinal: Negative for vomiting, abdominal pain and blood in stool.  Genitourinary: Negative for dysuria, frequency and hematuria.  Musculoskeletal:  Negative for back pain and arthralgias.  Skin: Negative.   Neurological: Positive for headaches. Negative for dizziness, seizures and speech difficulty.  Psychiatric/Behavioral: Negative for hallucinations and confusion.    Allergies  Benadryl and Fioricet  Home Medications   Current Outpatient Rx  Name Route Sig Dispense Refill  . ACETAMINOPHEN 500 MG PO TABS Oral Take 1,000 mg by mouth every 8 (eight) hours as needed. pain    . ALBUTEROL SULFATE HFA 108 (90 BASE) MCG/ACT IN AERS Inhalation Inhale 2 puffs into the lungs every 6 (six) hours as needed. Shortness of breath     . HYDROCODONE-ACETAMINOPHEN 5-325 MG PO TABS Oral Take 1 tablet by mouth every 6 (six) hours as needed for pain. 6 tablet 0  . IBUPROFEN 200 MG PO TABS Oral Take 400 mg by mouth every 6 (six) hours as needed. Pain      Ht 5\' 2"  (1.575 m)  Wt 178 lb (80.74 kg)  BMI 32.56 kg/m2  LMP 11/24/2011  Physical Exam  Nursing note and vitals reviewed. Constitutional: She is oriented to person, place, and time. She appears well-developed and well-nourished.  Non-toxic appearance.  HENT:  Head: Normocephalic.  Right Ear: Tympanic membrane and external ear normal.  Left Ear: Tympanic membrane and external ear normal.  Eyes: EOM and lids are normal. Pupils are equal, round, and reactive to light.  Neck: Normal range of motion. Neck supple. Carotid bruit is not present.  Cardiovascular:  Normal rate, regular rhythm, normal heart sounds, intact distal pulses and normal pulses.   Pulmonary/Chest: Breath sounds normal. No respiratory distress.  Abdominal: Soft. Bowel sounds are normal. There is no tenderness. There is no guarding.  Musculoskeletal: Normal range of motion.  Lymphadenopathy:       Head (right side): No submandibular adenopathy present.       Head (left side): No submandibular adenopathy present.    She has no cervical adenopathy.  Neurological: She is alert and oriented to person, place, and time. She has  normal strength. No cranial nerve deficit or sensory deficit. She exhibits normal muscle tone. Coordination normal.       Gait and speech wnl.  Skin: Skin is warm and dry.  Psychiatric: She has a normal mood and affect. Her speech is normal.    ED Course  Procedures (including critical care time)  Labs Reviewed - No data to display No results found.   No diagnosis found.    MDM  I have reviewed nursing notes, vital signs, and all appropriate lab and imaging results for this patient. Patient has history of migraine type headaches. She has had multiple emergency department visits for the same. Patient states that this headache is similar to previous headaches, is not associated with any vomiting or visual changes or other problems. Prescription for Norco #12 tablets given to the patient. Patient advised to followup with the [facility] clinic for assistance with control of her headaches.       Kathie Dike, [place] 12/14/11 [phone]

## 2011-12-31 ENCOUNTER — Encounter (HOSPITAL_COMMUNITY): Payer: Self-pay | Admitting: *Deleted

## 2011-12-31 ENCOUNTER — Emergency Department (HOSPITAL_COMMUNITY)
Admission: EM | Admit: 2011-12-31 | Discharge: 2011-12-31 | Disposition: A | Discharge status: Home / Self Care | Payer: Self-pay | Attending: Emergency Medicine | Admitting: Emergency Medicine

## 2011-12-31 DIAGNOSIS — R110 Nausea: Secondary | ICD-10-CM | POA: Insufficient documentation

## 2011-12-31 DIAGNOSIS — Z862 Personal history of diseases of the blood and blood-forming organs and certain disorders involving the immune mechanism: Secondary | ICD-10-CM | POA: Insufficient documentation

## 2011-12-31 DIAGNOSIS — J45909 Unspecified asthma, uncomplicated: Secondary | ICD-10-CM | POA: Insufficient documentation

## 2011-12-31 DIAGNOSIS — F17200 Nicotine dependence, unspecified, uncomplicated: Secondary | ICD-10-CM | POA: Insufficient documentation

## 2011-12-31 DIAGNOSIS — Z8669 Personal history of other diseases of the nervous system and sense organs: Secondary | ICD-10-CM | POA: Insufficient documentation

## 2011-12-31 DIAGNOSIS — R51 Headache: Secondary | ICD-10-CM | POA: Insufficient documentation

## 2011-12-31 MED ORDER — PROMETHAZINE HCL 12.5 MG PO TABS
25.0000 mg | ORAL_TABLET | Freq: Once | ORAL | Status: AC
  Administered 2011-12-31: 25 mg via ORAL
  Filled 2011-12-31: qty 2

## 2011-12-31 MED ORDER — PROMETHAZINE HCL 25 MG PO TABS: 25.0000 mg | ORAL_TABLET | Freq: Four times a day (QID) | ORAL | Status: DC | PRN

## 2011-12-31 MED ORDER — HYDROCODONE-ACETAMINOPHEN 5-325 MG PO TABS: 1.0000 | ORAL_TABLET | ORAL | Status: AC | PRN

## 2011-12-31 MED ORDER — KETOROLAC TROMETHAMINE 10 MG PO TABS
10.0000 mg | ORAL_TABLET | Freq: Once | ORAL | Status: AC
  Administered 2011-12-31: 10 mg via ORAL
  Filled 2011-12-31: qty 1

## 2011-12-31 MED ORDER — HYDROCODONE-ACETAMINOPHEN 5-325 MG PO TABS
2.0000 | ORAL_TABLET | Freq: Once | ORAL | Status: AC
  Administered 2011-12-31: 2 via ORAL
  Filled 2011-12-31: qty 2

## 2011-12-31 NOTE — ED Notes (Signed)
Headache for 1 week , no head injury,  Nausea, no vomiting.

## 2011-12-31 NOTE — ED Notes (Signed)
Pt with HA x 1 week with nausea, denies any vomiting, hx of migraines

## 2011-12-31 NOTE — ED Provider Notes (Signed)
History     CSN: [id number]  Arrival date & time 12/31/11  1049   First MD Initiated Contact with Patient 12/31/11 1127      Chief Complaint  Patient presents with  . Headache    (Consider location/radiation/quality/duration/timing/severity/associated sxs/prior treatment) Patient is a 26 y.o. female presenting with headaches. The history is provided by the patient.  Headache  This is a recurrent problem. The current episode started more than 1 week ago. The problem has not changed since onset.The pain is located in the right unilateral region. The quality of the pain is described as sharp. The pain is moderate. Associated symptoms include nausea. Pertinent negatives include no fever, no palpitations, no syncope, no shortness of breath and no vomiting. Associated symptoms comments: dizzy. She has tried acetaminophen and NSAIDs for the symptoms.    Past Medical History  Diagnosis Date  . Asthma   . Anemia   . Migraine     History reviewed. No pertinent past surgical history.  Family History  Problem Relation Age of Onset  . Cancer Mother   . Hypertension Father     History  Substance Use Topics  . Smoking status: Current Every Day Smoker -- 0.5 packs/day    Types: Cigarettes  . Smokeless tobacco: Not on file  . Alcohol Use: No    OB History    Grav Para Term Preterm Abortions TAB SAB Ect Mult Living                  Review of Systems  Constitutional: Negative for fever and activity change.       All ROS Neg except as noted in HPI  HENT: Negative for nosebleeds and neck pain.   Eyes: Negative for photophobia and discharge.  Respiratory: Negative for cough, shortness of breath and wheezing.   Cardiovascular: Negative for chest pain, palpitations and syncope.  Gastrointestinal: Positive for nausea. Negative for vomiting, abdominal pain and blood in stool.  Genitourinary: Negative for dysuria, frequency and hematuria.  Musculoskeletal: Negative for back pain and  arthralgias.  Skin: Negative.   Neurological: Positive for headaches. Negative for dizziness, seizures and speech difficulty.  Psychiatric/Behavioral: Negative for hallucinations and confusion.    Allergies  Benadryl and Fioricet  Home Medications   Current Outpatient Rx  Name Route Sig Dispense Refill  . ACETAMINOPHEN 500 MG PO TABS Oral Take 1,000 mg by mouth every 8 (eight) hours as needed. pain    . IBUPROFEN 200 MG PO TABS Oral Take 400 mg by mouth every 6 (six) hours as needed. Pain      BP 127/81  Pulse 87  Temp 98.4 F (36.9 C) (Oral)  Resp 16  Ht 5\' 2"  (1.575 m)  Wt 178 lb (80.74 kg)  BMI 32.56 kg/m2  SpO2 100%  LMP 12/22/2011  Physical Exam  Nursing note and vitals reviewed. Constitutional: She is oriented to person, place, and time. She appears well-developed and well-nourished.  Non-toxic appearance.  HENT:  Head: Normocephalic.  Right Ear: Tympanic membrane and external ear normal.  Left Ear: Tympanic membrane and external ear normal.       Nasal congestion.  Eyes: EOM and lids are normal. Pupils are equal, round, and reactive to light.  Neck: Normal range of motion. Neck supple. Carotid bruit is not present.  Cardiovascular: Normal rate, regular rhythm, normal heart sounds, intact distal pulses and normal pulses.   Pulmonary/Chest: Breath sounds normal. No respiratory distress.  Abdominal: Soft. Bowel sounds are normal. There is  no tenderness. There is no guarding.  Musculoskeletal: Normal range of motion.  Lymphadenopathy:       Head (right side): No submandibular adenopathy present.       Head (left side): No submandibular adenopathy present.    She has no cervical adenopathy.  Neurological: She is alert and oriented to person, place, and time. She has normal strength. No cranial nerve deficit or sensory deficit.  Skin: Skin is warm and dry.  Psychiatric: She has a normal mood and affect. Her speech is normal.    ED Course  Procedures (including  critical care time)  Labs Reviewed - No data to display No results found.   No diagnosis found.    MDM  I have reviewed nursing notes, vital signs, and all appropriate lab and imaging results for this patient. Pt has had headache for 1 week. Some nausea, but no vomiting. No gross neuro deficit.  Rx for norco given to the patient. [facility] given to the patient.       Kathie Dike, [place] 12/31/11 2227

## 2012-01-10 NOTE — ED Provider Notes (Signed)
Medical screening examination/treatment/procedure(s) were performed by non-physician practitioner and as supervising physician I was immediately available for consultation/collaboration. Devoria Albe, MD, Armando Gang   Ward Givens, MD 01/10/12 [phone]

## 2012-01-30 ENCOUNTER — Emergency Department (HOSPITAL_COMMUNITY)
Admission: EM | Admit: 2012-01-30 | Discharge: 2012-01-30 | Discharge status: Against Medical Advice | Payer: Self-pay | Attending: Emergency Medicine | Admitting: Emergency Medicine

## 2012-01-30 ENCOUNTER — Encounter (HOSPITAL_COMMUNITY): Payer: Self-pay | Admitting: *Deleted

## 2012-01-30 DIAGNOSIS — F17200 Nicotine dependence, unspecified, uncomplicated: Secondary | ICD-10-CM | POA: Insufficient documentation

## 2012-01-30 DIAGNOSIS — Z862 Personal history of diseases of the blood and blood-forming organs and certain disorders involving the immune mechanism: Secondary | ICD-10-CM | POA: Insufficient documentation

## 2012-01-30 DIAGNOSIS — L600 Ingrowing nail: Secondary | ICD-10-CM | POA: Insufficient documentation

## 2012-01-30 DIAGNOSIS — Z8679 Personal history of other diseases of the circulatory system: Secondary | ICD-10-CM | POA: Insufficient documentation

## 2012-01-30 DIAGNOSIS — J45909 Unspecified asthma, uncomplicated: Secondary | ICD-10-CM | POA: Insufficient documentation

## 2012-01-30 MED ORDER — SULFAMETHOXAZOLE-TRIMETHOPRIM 800-160 MG PO TABS: 1.0000 | ORAL_TABLET | Freq: Two times a day (BID) | ORAL | Status: DC

## 2012-01-30 MED ORDER — SULFAMETHOXAZOLE-TMP DS 800-160 MG PO TABS
1.0000 | ORAL_TABLET | Freq: Once | ORAL | Status: AC
  Administered 2012-01-30: 1 via ORAL
  Filled 2012-01-30: qty 1

## 2012-01-30 NOTE — ED Notes (Signed)
Discoloration/ bruising and pain to right great toe. No known injury

## 2012-01-30 NOTE — ED Provider Notes (Signed)
History   This chart was scribed for Donnetta Hutching, MD by Thad Ranger, ED Scribe. This patient was seen in room APA09/APA09 and the patient's care was started at 9:48 AM.   CSN: [id number]  Arrival date & time 01/30/12  4098   First MD Initiated Contact with Patient 01/30/12 [phone]      Chief Complaint  Patient presents with  . Toe Pain    The history is provided by the patient. No language interpreter was used.   Natasha Bean is a 26 y.o. female who presents to the Emergency Department complaining of constant, gradual onset, gradually worsening, moderate to severe right great toe pain 3 days ago. There is associated bruising. Patient denies any known injuries. Pain is aggravated by movement and applying pressure. There are no associated symptoms or modifying factors. Patient has no other complaints. She is otherwise healthy.    Past Medical History  Diagnosis Date  . Asthma   . Anemia   . Migraine     History reviewed. No pertinent past surgical history.  Family History  Problem Relation Age of Onset  . Cancer Mother   . Hypertension Father     History  Substance Use Topics  . Smoking status: Current Every Day Smoker -- 0.5 packs/day    Types: Cigarettes  . Smokeless tobacco: Not on file  . Alcohol Use: No    No OB history provided.  Review of Systems  A complete 10 system review of systems was obtained and all systems are negative except as noted in the HPI and PMH.   Allergies  Benadryl and Fioricet  Home Medications   Current Outpatient Rx  Name  Route  Sig  Dispense  Refill  . ACETAMINOPHEN 500 MG PO TABS   Oral   Take 1,000 mg by mouth every 8 (eight) hours as needed. pain         . IBUPROFEN 200 MG PO TABS   Oral   Take 400 mg by mouth every 6 (six) hours as needed. Pain         . PROMETHAZINE HCL 25 MG PO TABS   Oral   Take 1 tablet (25 mg total) by mouth every 6 (six) hours as needed for nausea.   12 tablet   0     BP 114/64   Pulse 84  Temp 98 F (36.7 C) (Oral)  Resp 16  Ht 5\' 2"  (1.575 m)  Wt 178 lb (80.74 kg)  BMI 32.56 kg/m2  SpO2 100%  LMP 01/24/2012  Physical Exam  Constitutional: She is oriented to person, place, and time. She appears well-developed and well-nourished.  HENT:  Head: Normocephalic.  Neck: Normal range of motion.  Musculoskeletal: Normal range of motion. She exhibits no edema.       Most sore on the right great toe, the medial distal aspect.  It is tender, slightly erythematous, and puffy.   Neurological: She is alert and oriented to person, place, and time.  Skin: Skin is warm and dry.    ED Course  Procedures (including critical care time)  DIAGNOSTIC STUDIES: Oxygen Saturation is 100% on room air, normal by my interpretation.    COORDINATION OF CARE: 10:25 AM Discussed treatment plan which includes antibiotics, and ibuprofen with pt at bedside and pt agreed to plan. 10:25 AM Discussed the diagnoses with the patient. She has an ingrown toe nail.    Labs Reviewed - No data to display No results found.  No diagnosis found.    MDM  History and physical consistent with ingrown toenail. No I and D necessary. Discharge home with Septra DS for one week and hot salt water soaks      I personally performed the services described in this documentation, which was scribed in my presence. The recorded information has been reviewed and is accurate.    Donnetta Hutching, MD 01/30/12 1120

## 2012-01-30 NOTE — ED Notes (Signed)
Pt left prior to receiving d/c instructions/prescriptions.

## 2012-03-16 ENCOUNTER — Emergency Department (HOSPITAL_COMMUNITY)
Admission: EM | Admit: 2012-03-16 | Discharge: 2012-03-16 | Disposition: A | Discharge status: Home / Self Care | Payer: Self-pay | Attending: Emergency Medicine | Admitting: Emergency Medicine

## 2012-03-16 ENCOUNTER — Emergency Department (HOSPITAL_COMMUNITY): Payer: Self-pay

## 2012-03-16 ENCOUNTER — Encounter (HOSPITAL_COMMUNITY): Payer: Self-pay | Admitting: *Deleted

## 2012-03-16 DIAGNOSIS — Z862 Personal history of diseases of the blood and blood-forming organs and certain disorders involving the immune mechanism: Secondary | ICD-10-CM | POA: Insufficient documentation

## 2012-03-16 DIAGNOSIS — M2550 Pain in unspecified joint: Secondary | ICD-10-CM | POA: Insufficient documentation

## 2012-03-16 DIAGNOSIS — M436 Torticollis: Secondary | ICD-10-CM | POA: Insufficient documentation

## 2012-03-16 DIAGNOSIS — Z8679 Personal history of other diseases of the circulatory system: Secondary | ICD-10-CM | POA: Insufficient documentation

## 2012-03-16 DIAGNOSIS — Z79899 Other long term (current) drug therapy: Secondary | ICD-10-CM | POA: Insufficient documentation

## 2012-03-16 DIAGNOSIS — J45909 Unspecified asthma, uncomplicated: Secondary | ICD-10-CM | POA: Insufficient documentation

## 2012-03-16 DIAGNOSIS — F17200 Nicotine dependence, unspecified, uncomplicated: Secondary | ICD-10-CM | POA: Insufficient documentation

## 2012-03-16 MED ORDER — IBUPROFEN 800 MG PO TABS
800.0000 mg | ORAL_TABLET | Freq: Once | ORAL | Status: AC
  Administered 2012-03-16: 800 mg via ORAL
  Filled 2012-03-16: qty 1

## 2012-03-16 MED ORDER — CYCLOBENZAPRINE HCL 10 MG PO TABS
10.0000 mg | ORAL_TABLET | Freq: Once | ORAL | Status: AC
  Administered 2012-03-16: 10 mg via ORAL
  Filled 2012-03-16: qty 1

## 2012-03-16 MED ORDER — IBUPROFEN 800 MG PO TABS: 800.0000 mg | ORAL_TABLET | Freq: Three times a day (TID) | ORAL | Status: DC

## 2012-03-16 MED ORDER — CYCLOBENZAPRINE HCL 10 MG PO TABS: 10.0000 mg | ORAL_TABLET | Freq: Three times a day (TID) | ORAL | Status: DC | PRN

## 2012-03-16 NOTE — ED Notes (Signed)
Neck pain x 7 days radiating into right shoulder.  Denies injury, denies fever.

## 2012-03-16 NOTE — ED Notes (Signed)
Patient with no complaints at this time. Respirations even and unlabored. Skin warm/dry. Discharge instructions reviewed with patient at this time. Patient given opportunity to voice concerns/ask questions. Patient discharged at this time and left Emergency Department with steady gait.   

## 2012-03-18 NOTE — ED Provider Notes (Signed)
History     CSN: [id number]  Arrival date & time 03/16/12  1039   First MD Initiated Contact with Patient 03/16/12 1052      Chief Complaint  Patient presents with  . Neck Pain    (Consider location/radiation/quality/duration/timing/severity/associated sxs/prior treatment) Patient is a 27 y.o. female presenting with neck pain. The history is provided by the patient.  Neck Pain  This is a new problem. The current episode started more than 1 week ago. The problem occurs constantly. The problem has not changed since onset.The pain is associated with twisting. There has been no fever. The pain is present in the right side. The quality of the pain is described as aching. The pain radiates to the right shoulder. The pain is moderate. The symptoms are aggravated by bending, twisting and position. The pain is the same all the time. Pertinent negatives include no photophobia, no visual change, no chest pain, no syncope, no numbness, no headaches, no bowel incontinence, no bladder incontinence, no paresis, no tingling and no weakness. Treatments tried: tylenol. The treatment provided mild relief.    Past Medical History  Diagnosis Date  . Asthma   . Anemia   . Migraine     History reviewed. No pertinent past surgical history.  Family History  Problem Relation Age of Onset  . Cancer Mother   . Hypertension Father     History  Substance Use Topics  . Smoking status: Current Every Day Smoker -- 0.5 packs/day    Types: Cigarettes  . Smokeless tobacco: Not on file  . Alcohol Use: No    OB History    Grav Para Term Preterm Abortions TAB SAB Ect Mult Living                  Review of Systems  Constitutional: Negative for fever and chills.  HENT: Positive for neck pain. Negative for ear pain, congestion, sore throat, facial swelling, trouble swallowing and neck stiffness.   Eyes: Negative for photophobia and visual disturbance.  Cardiovascular: Negative for chest pain and syncope.    Gastrointestinal: Negative for nausea, vomiting, abdominal pain and bowel incontinence.  Genitourinary: Negative for bladder incontinence, dysuria and difficulty urinating.  Musculoskeletal: Positive for joint swelling and arthralgias.  Skin: Negative for color change and wound.  Neurological: Negative for dizziness, tingling, facial asymmetry, weakness, numbness and headaches.  Hematological: Negative for adenopathy.  All other systems reviewed and are negative.    Allergies  Benadryl and Fioricet  Home Medications   Current Outpatient Rx  Name  Route  Sig  Dispense  Refill  . ACETAMINOPHEN 500 MG PO TABS   Oral   Take 1,000 mg by mouth every 6 (six) hours as needed. pain         . IBUPROFEN 200 MG PO TABS   Oral   Take 400 mg by mouth every 6 (six) hours as needed. Pain         . CYCLOBENZAPRINE HCL 10 MG PO TABS   Oral   Take 1 tablet (10 mg total) by mouth 3 (three) times daily as needed for muscle spasms.   21 tablet   0   . IBUPROFEN 800 MG PO TABS   Oral   Take 1 tablet (800 mg total) by mouth 3 (three) times daily.   21 tablet   0     BP 138/88  Pulse 77  Temp 98.3 F (36.8 C) (Oral)  Resp 16  Ht 5\' 2"  (1.575 m)  Wt 178 lb (80.74 kg)  BMI 32.56 kg/m2  SpO2 100%  LMP 02/13/2012  Physical Exam  Nursing note and vitals reviewed. Constitutional: She is oriented to person, place, and time. She appears well-developed and well-nourished. No distress.  HENT:  Head: Normocephalic and atraumatic.  Mouth/Throat: Oropharynx is clear and moist.  Eyes: EOM are normal. Pupils are equal, round, and reactive to light.  Neck: Phonation normal. Muscular tenderness present. No spinous process tenderness present. No rigidity. Decreased range of motion present. No erythema present. No Brudzinski's sign and no Kernig's sign noted. No thyromegaly present.         ttp of the right cervical paraspinal muscles and right trapezius muscle.  Grip strength is strong and  equal bilaterally.  Distal sensation intact,  CR < 2 sec.  Pain is reproduced with palpation and rotation of the neck.   Cardiovascular: Normal rate, regular rhythm, normal heart sounds and intact distal pulses.   No murmur heard. Pulmonary/Chest: Effort normal and breath sounds normal. No respiratory distress. She exhibits no tenderness.  Musculoskeletal: She exhibits tenderness. She exhibits no edema.       Cervical back: She exhibits tenderness. She exhibits normal range of motion, no bony tenderness, no swelling, no deformity, no spasm and normal pulse.       See neck exam  Lymphadenopathy:    She has no cervical adenopathy.  Neurological: She is alert and oriented to person, place, and time. No cranial nerve deficit or sensory deficit. She exhibits normal muscle tone. Coordination normal.  Reflex Scores:      Tricep reflexes are 2+ on the right side and 2+ on the left side.      Bicep reflexes are 2+ on the right side and 2+ on the left side. Skin: Skin is warm and dry.    ED Course  Procedures (including critical care time)  Labs Reviewed - No data to display No results found.   1. Torticollis    Dg Cervical Spine Complete  03/16/2012  *RADIOLOGY REPORT*  Clinical Data: 5-day history of right greater than left sided neck pain.  CERVICAL SPINE - COMPLETE 4+ VIEW  Comparison: None.  Findings: Slight reversal of the usual cervical lordosis.  Anatomic posterior alignment.  No visible fractures.  Well-preserved disc spaces.  Normal prevertebral soft tissues.  Facet joints intact. No significant bony foraminal stenoses.  No static evidence of instability.  IMPRESSION: Slight reversal of the usual lordosis which may reflect positioning and/or spasm.  Otherwise normal examination.   Original Report Authenticated By: Hulan Saas, M.D.      MDM  Vitals stable, pt is well appearing, no meningeal signs.  No focal NV deficits.  Likely torticollis.  She agrees to apply ice and f/u with her  PMD  Prescribed: Flexeril ibuprofen    Shawndell Varas L. Marigny Borre, [place] 03/18/12 1359

## 2012-03-19 NOTE — ED Provider Notes (Signed)
Medical screening examination/treatment/procedure(s) were performed by non-physician practitioner and as supervising physician I was immediately available for consultation/collaboration.   Rita Prom L Hayly Litsey, MD 03/19/12 0709 

## 2012-07-19 ENCOUNTER — Emergency Department (HOSPITAL_COMMUNITY)
Admission: EM | Admit: 2012-07-19 | Discharge: 2012-07-19 | Disposition: A | Discharge status: Home / Self Care | Payer: Self-pay | Attending: Emergency Medicine | Admitting: Emergency Medicine

## 2012-07-19 ENCOUNTER — Encounter (HOSPITAL_COMMUNITY): Payer: Self-pay | Admitting: Emergency Medicine

## 2012-07-19 DIAGNOSIS — R109 Unspecified abdominal pain: Secondary | ICD-10-CM | POA: Insufficient documentation

## 2012-07-19 DIAGNOSIS — Z79899 Other long term (current) drug therapy: Secondary | ICD-10-CM | POA: Insufficient documentation

## 2012-07-19 DIAGNOSIS — M549 Dorsalgia, unspecified: Secondary | ICD-10-CM

## 2012-07-19 DIAGNOSIS — R300 Dysuria: Secondary | ICD-10-CM | POA: Insufficient documentation

## 2012-07-19 DIAGNOSIS — R350 Frequency of micturition: Secondary | ICD-10-CM | POA: Insufficient documentation

## 2012-07-19 DIAGNOSIS — N390 Urinary tract infection, site not specified: Secondary | ICD-10-CM | POA: Insufficient documentation

## 2012-07-19 DIAGNOSIS — R110 Nausea: Secondary | ICD-10-CM | POA: Insufficient documentation

## 2012-07-19 DIAGNOSIS — F17200 Nicotine dependence, unspecified, uncomplicated: Secondary | ICD-10-CM | POA: Insufficient documentation

## 2012-07-19 DIAGNOSIS — Z3202 Encounter for pregnancy test, result negative: Secondary | ICD-10-CM | POA: Insufficient documentation

## 2012-07-19 DIAGNOSIS — R1013 Epigastric pain: Secondary | ICD-10-CM | POA: Insufficient documentation

## 2012-07-19 DIAGNOSIS — M545 Low back pain, unspecified: Secondary | ICD-10-CM | POA: Insufficient documentation

## 2012-07-19 DIAGNOSIS — G8929 Other chronic pain: Secondary | ICD-10-CM | POA: Insufficient documentation

## 2012-07-19 DIAGNOSIS — J45909 Unspecified asthma, uncomplicated: Secondary | ICD-10-CM | POA: Insufficient documentation

## 2012-07-19 LAB — URINALYSIS, ROUTINE W REFLEX MICROSCOPIC
Bilirubin Urine: NEGATIVE
Glucose, UA: NEGATIVE mg/dL
Hgb urine dipstick: NEGATIVE
Ketones, ur: NEGATIVE mg/dL
Nitrite: NEGATIVE
Protein, ur: NEGATIVE mg/dL
Specific Gravity, Urine: >1.03 — ABNORMAL HIGH (ref 1.005–1.030)
Urobilinogen, UA: 0.2 mg/dL (ref 0.0–1.0)
pH: 5.5 (ref 5.0–8.0)

## 2012-07-19 LAB — URINE MICROSCOPIC-ADD ON

## 2012-07-19 LAB — PREGNANCY, URINE: Preg Test, Ur: NEGATIVE

## 2012-07-19 MED ORDER — PROMETHAZINE HCL 25 MG PO TABS: 25.0000 mg | ORAL_TABLET | Freq: Four times a day (QID) | ORAL | Status: DC | PRN

## 2012-07-19 MED ORDER — CYCLOBENZAPRINE HCL 10 MG PO TABS
10.0000 mg | ORAL_TABLET | Freq: Once | ORAL | Status: AC
  Administered 2012-07-19: 10 mg via ORAL
  Filled 2012-07-19: qty 1

## 2012-07-19 MED ORDER — CYCLOBENZAPRINE HCL 10 MG PO TABS: 10.0000 mg | ORAL_TABLET | Freq: Three times a day (TID) | ORAL | Status: DC | PRN

## 2012-07-19 MED ORDER — FAMOTIDINE 20 MG PO TABS
20.0000 mg | ORAL_TABLET | Freq: Once | ORAL | Status: AC
  Administered 2012-07-19: 20 mg via ORAL
  Filled 2012-07-19: qty 1

## 2012-07-19 MED ORDER — ONDANSETRON 8 MG PO TBDP
8.0000 mg | ORAL_TABLET | Freq: Once | ORAL | Status: AC
  Administered 2012-07-19: 8 mg via ORAL
  Filled 2012-07-19: qty 1

## 2012-07-19 MED ORDER — TRAMADOL HCL 50 MG PO TABS: 100.0000 mg | ORAL_TABLET | Freq: Four times a day (QID) | ORAL | Status: DC | PRN

## 2012-07-19 MED ORDER — GI COCKTAIL ~~LOC~~
30.0000 mL | Freq: Once | ORAL | Status: AC
  Administered 2012-07-19: 30 mL via ORAL
  Filled 2012-07-19: qty 30

## 2012-07-19 NOTE — ED Notes (Signed)
Pt c/lo lower abd pain radiating to back and pressure with urination. Some nausea. Denies v/d. nad noted.

## 2012-07-19 NOTE — ED Notes (Signed)
MD at bedside. 

## 2012-07-19 NOTE — ED Provider Notes (Signed)
History    This chart was scribed for Ward Givens, MD, by Frederik Pear, ED scribe. The patient was seen in room APA07/APA07 and the patient's care was started at 1042.    CSN: [id number]  Arrival date & time 07/19/12  1038   First MD Initiated Contact with Patient 07/19/12 1042      Chief Complaint  Patient presents with  . Abdominal Pain  . Back Pain  . Urinary Tract Infection    (Consider location/radiation/quality/duration/timing/severity/associated sxs/prior treatment) The history is provided by the patient and medical records. No language interpreter was used.    HPI Comments: Natasha Bean is a 27 y.o. female with a h/o of chronic back pain, migraines, anemia, and asthma who presents to the Emergency Department complaining of sharp, constant, severe epigastric abdominal pain that radiates to her bilateral lower back that is aggravated by changing positions and alleviated by nothing with associated nausea, increased urinary frequency, and a pressure-like discomfort with urination that began yesterday morning. She states that the achy back pain is worse in the middle of her lower back and is aggravated by bending to the left. She has a h/o of chronic back pain, but reports that the current pain is different than her baseline pain. She denies emesis, rectal bleeding, fever, or vaginal discharge. She reports that her last monthly normal period was on 04/16, and she is not currently on her period. She also states that she has been able to consume food and fluids normally. She reports that she has treated the symptoms with Advil and ibuprofen with no relief.   She is a current, every day 1/2 pack a day smoker who does not consume ETOH. She denies recent sick contacts. She reports that it is possible that she could be pregnant, but unlikely since she is on birth control. 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 She had an abdominal pelvic CT performed in ED on 11/23/11, which was normal. She states that she does not have a PCP and all of her medications have been prescribed through previous ED visits.  PCP none  Past Medical History  Diagnosis Date  . Asthma   . Anemia   . Migraine     History reviewed. No pertinent past surgical history.  Family History  Problem Relation Age of Onset  . Cancer Mother   . Hypertension Father     History  Substance Use Topics  . Smoking status: Current Every Day Smoker -- 0.50 packs/day    Types: Cigarettes  . Smokeless tobacco: Not on file  . 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  Gastrointestinal: Positive for nausea and abdominal pain. Negative for vomiting and anal bleeding.  Genitourinary: Positive for dysuria and frequency. Negative for vaginal discharge.  Musculoskeletal: Positive for back pain.  All other systems reviewed and are negative.    Allergies  Benadryl and Fioricet  Home Medications   Current Outpatient Rx  Name  Route  Sig  Dispense  Refill  . acetaminophen (TYLENOL) 500 MG tablet   Oral   Take 1,000 mg by mouth every 6 (six) hours as needed. pain         .  albuterol (PROVENTIL HFA;VENTOLIN HFA) 108 (90 BASE) MCG/ACT inhaler   Inhalation   Inhale 2 puffs into the lungs every 6 (six) hours as needed for wheezing.         Marland Kitchen ibuprofen (ADVIL,MOTRIN) 200 MG tablet   Oral   Take 400 mg by mouth every 6 (six) hours as needed. Pain           BP 115/68  Pulse 79  Temp(Src) 97.7 F (36.5 C) (Oral)  Resp 18  Ht 5\' 2"  (1.575 m)  Wt 178 lb (80.74 kg)  BMI 32.55 kg/m2  SpO2 98%  LMP 06/25/2012  Vital signs normal    Physical Exam  Nursing note and vitals reviewed. Constitutional: She is oriented to person,  place, and time. She appears well-developed and well-nourished.  Non-toxic appearance. She does not appear ill. No distress.  HENT:  Head: Normocephalic and atraumatic.  Right Ear: External ear normal.  Left Ear: External ear normal.  Nose: Nose normal. No mucosal edema or rhinorrhea.  Mouth/Throat: Oropharynx is clear and moist and mucous membranes are normal. No dental abscesses or edematous.  Eyes: Conjunctivae and EOM are normal. Pupils are equal, round, and reactive to light.  Neck: Normal range of motion and full passive range of motion without pain. Neck supple.  Cardiovascular: Normal rate, regular rhythm and normal heart sounds.  Exam reveals no gallop and no friction rub.   No murmur heard. Pulmonary/Chest: Effort normal and breath sounds normal. No respiratory distress. She has no wheezes. She has no rhonchi. She has no rales. She exhibits no tenderness and no crepitus.  Abdominal: Soft. Normal appearance and bowel sounds are normal. She exhibits no distension. There is tenderness. There is no rebound and no guarding.    Mild epigastric tenderness. No guarding or rebound.   Musculoskeletal: Normal range of motion. She exhibits tenderness. She exhibits no edema.       Back:  Moves all extremities well. Diffuse lower lumbar tenderness with intact ROM, but hurts more with ROM to the left.   Neurological: She is alert and oriented to person, place, and time. She has normal strength. No cranial nerve deficit.  Skin: Skin is warm, dry and intact. No rash noted. No erythema. No pallor.  Psychiatric: She has a normal mood and affect. Her speech is normal and behavior is normal. Her mood appears not anxious.    ED Course  Procedures (including critical care time)  Medications  ondansetron (ZOFRAN-ODT) disintegrating tablet 8 mg (8 mg Oral Given 07/19/12 1104)  gi cocktail (Maalox,Lidocaine,Donnatal) (30 mLs Oral Given 07/19/12 1108)  cyclobenzaprine (FLEXERIL) tablet 10 mg (10 mg Oral  Given 07/19/12 1105)  famotidine (PEPCID) tablet 20 mg (20 mg Oral Given 07/19/12 1124)   DIAGNOSTIC STUDIES: Oxygen Saturation is 98% on room air, normal by my interpretation.    COORDINATION OF CARE:  10:54- Discussed planned course of treatment with the patient, including Zofran, gi cocktail, Flexeril, UA, and pregnancy test, who is agreeable at this time.  12:03- Recheck- She reports that her abdominal pain is resolved, but is still complaining of back pain with movement. She was able to drink fluids.   Results for orders placed during the hospital encounter of 07/19/12  URINALYSIS, ROUTINE W REFLEX MICROSCOPIC      Result Value Range   Color, Urine YELLOW  YELLOW   APPearance HAZY (*) CLEAR   Specific Gravity, Urine >1.030 (*) 1.005 - 1.030   pH 5.5  5.0 - 8.0  Glucose, UA NEGATIVE  NEGATIVE mg/dL   Hgb urine dipstick NEGATIVE  NEGATIVE   Bilirubin Urine NEGATIVE  NEGATIVE   Ketones, ur NEGATIVE  NEGATIVE mg/dL   Protein, ur NEGATIVE  NEGATIVE mg/dL   Urobilinogen, UA 0.2  0.0 - 1.0 mg/dL   Nitrite NEGATIVE  NEGATIVE   Leukocytes, UA SMALL (*) NEGATIVE  PREGNANCY, URINE      Result Value Range   Preg Test, Ur NEGATIVE  NEGATIVE  URINE MICROSCOPIC-ADD ON      Result Value Range   Squamous Epithelial / LPF MANY (*) RARE   WBC, UA 3-6  <3 WBC/hpf   Bacteria, UA RARE  RARE   Laboratory interpretation all normal except  Except concentrated, contaminated urine       1. Epigastric abdominal pain   2. Nausea   3. Back pain     Discharge Medication List as of 07/19/2012 12:21 PM    START taking these medications   Details  cyclobenzaprine (FLEXERIL) 10 MG tablet Take 1 tablet (10 mg total) by mouth 3 (three) times daily as needed (back pain)., Starting 07/19/2012, Until Discontinued, Print    promethazine (PHENERGAN) 25 MG tablet Take 1 tablet (25 mg total) by mouth every 6 (six) hours as needed for nausea., Starting 07/19/2012, Until Discontinued, Print    traMADol  (ULTRAM) 50 MG tablet Take 2 tablets (100 mg total) by mouth every 6 (six) hours as needed., Starting 07/19/2012, Until Discontinued, Print        Plan discharge  Devoria Albe, MD, FACEP    MDM   I personally performed the services described in this documentation, which was scribed in my presence. The recorded information has been reviewed and considered.  Devoria Albe, MD, Armando Gang         Ward Givens, MD 07/19/12 [phone]

## 2012-09-01 ENCOUNTER — Encounter (HOSPITAL_COMMUNITY): Payer: Self-pay | Admitting: *Deleted

## 2012-09-01 ENCOUNTER — Emergency Department (HOSPITAL_COMMUNITY)
Admission: EM | Admit: 2012-09-01 | Discharge: 2012-09-01 | Discharge status: Against Medical Advice | Payer: Self-pay | Attending: Emergency Medicine | Admitting: Emergency Medicine

## 2012-09-01 DIAGNOSIS — M545 Low back pain, unspecified: Secondary | ICD-10-CM | POA: Insufficient documentation

## 2012-09-01 DIAGNOSIS — M549 Dorsalgia, unspecified: Secondary | ICD-10-CM

## 2012-09-01 DIAGNOSIS — F17200 Nicotine dependence, unspecified, uncomplicated: Secondary | ICD-10-CM | POA: Insufficient documentation

## 2012-09-01 DIAGNOSIS — J45909 Unspecified asthma, uncomplicated: Secondary | ICD-10-CM | POA: Insufficient documentation

## 2012-09-01 NOTE — ED Notes (Signed)
Pt was told she had the right to a medical screening and was informed that risks of her leaving were worsening condition and/or death.  Pt signed out AMA>

## 2012-09-01 NOTE — ED Notes (Addendum)
Low back pain for 3 days Increased pain with movement.  Denies urinary sx

## 2012-09-18 NOTE — ED Provider Notes (Signed)
   History    CSN: [id number] Arrival date & time 09/01/12  1136  First MD Initiated Contact with Patient 09/01/12 1222     Chief Complaint  Patient presents with  . Back Pain   (Consider location/radiation/quality/duration/timing/severity/associated sxs/prior Treatment) HPI Comments: PT LEFT ED BEFORE BEING EXAMINED.  Past Medical History  Diagnosis Date  . Asthma   . Anemia   . Migraine    History reviewed. No pertinent past surgical history. Family History  Problem Relation Age of Onset  . Cancer Mother   . Hypertension Father    History  Substance Use Topics  . Smoking status: Current Every Day Smoker -- 0.50 packs/day    Types: Cigarettes  . Smokeless tobacco: Not on file  . Alcohol Use: No   OB History   Grav Para Term Preterm Abortions TAB SAB Ect Mult Living                 Review of Systems  Allergies  Benadryl and Fioricet  Home Medications   Current Outpatient Rx  Name  Route  Sig  Dispense  Refill  . acetaminophen (TYLENOL) 500 MG tablet   Oral   Take 1,000 mg by mouth every 6 (six) hours as needed. pain         . albuterol (PROVENTIL HFA;VENTOLIN HFA) 108 (90 BASE) MCG/ACT inhaler   Inhalation   Inhale 2 puffs into the lungs every 6 (six) hours as needed for wheezing.         Marland Kitchen ibuprofen (ADVIL,MOTRIN) 200 MG tablet   Oral   Take 400 mg by mouth every 6 (six) hours as needed. Pain          BP 133/89  Pulse 76  Temp(Src) 97.2 F (36.2 C) (Oral)  Resp 20  Ht 5\' 7"  (1.702 m)  Wt 174 lb (78.926 kg)  BMI 27.25 kg/m2  SpO2 100%  LMP 08/22/2012 Physical Exam  ED Course  Procedures (including critical care time) Labs Reviewed - No data to display No results found. 1. Back pain     MDM  **PT LEFT ED BEFORE BEING EXAMINED.Kathie Dike, PA-C 09/20/12 1054

## 2012-10-01 NOTE — ED Provider Notes (Signed)
Medical screening examination/treatment/procedure(s) were performed by non-physician practitioner and as supervising physician I was immediately available for consultation/collaboration.   Hurman Horn, MD 10/01/12 2158

## 2012-12-03 ENCOUNTER — Encounter (HOSPITAL_COMMUNITY): Payer: Self-pay | Admitting: Emergency Medicine

## 2012-12-03 ENCOUNTER — Emergency Department (HOSPITAL_COMMUNITY): Payer: Self-pay

## 2012-12-03 ENCOUNTER — Emergency Department (HOSPITAL_COMMUNITY)
Admission: EM | Admit: 2012-12-03 | Discharge: 2012-12-03 | Disposition: A | Discharge status: Home / Self Care | Payer: Self-pay | Attending: Emergency Medicine | Admitting: Emergency Medicine

## 2012-12-03 DIAGNOSIS — Z862 Personal history of diseases of the blood and blood-forming organs and certain disorders involving the immune mechanism: Secondary | ICD-10-CM | POA: Insufficient documentation

## 2012-12-03 DIAGNOSIS — N83209 Unspecified ovarian cyst, unspecified side: Secondary | ICD-10-CM | POA: Insufficient documentation

## 2012-12-03 DIAGNOSIS — M545 Low back pain, unspecified: Secondary | ICD-10-CM | POA: Insufficient documentation

## 2012-12-03 DIAGNOSIS — R630 Anorexia: Secondary | ICD-10-CM | POA: Insufficient documentation

## 2012-12-03 DIAGNOSIS — R1031 Right lower quadrant pain: Secondary | ICD-10-CM | POA: Insufficient documentation

## 2012-12-03 DIAGNOSIS — R109 Unspecified abdominal pain: Secondary | ICD-10-CM

## 2012-12-03 DIAGNOSIS — K047 Periapical abscess without sinus: Secondary | ICD-10-CM | POA: Insufficient documentation

## 2012-12-03 DIAGNOSIS — Z3202 Encounter for pregnancy test, result negative: Secondary | ICD-10-CM | POA: Insufficient documentation

## 2012-12-03 DIAGNOSIS — K0889 Other specified disorders of teeth and supporting structures: Secondary | ICD-10-CM

## 2012-12-03 DIAGNOSIS — Z8679 Personal history of other diseases of the circulatory system: Secondary | ICD-10-CM | POA: Insufficient documentation

## 2012-12-03 DIAGNOSIS — J45909 Unspecified asthma, uncomplicated: Secondary | ICD-10-CM | POA: Insufficient documentation

## 2012-12-03 DIAGNOSIS — F17200 Nicotine dependence, unspecified, uncomplicated: Secondary | ICD-10-CM | POA: Insufficient documentation

## 2012-12-03 DIAGNOSIS — K029 Dental caries, unspecified: Secondary | ICD-10-CM | POA: Insufficient documentation

## 2012-12-03 DIAGNOSIS — K089 Disorder of teeth and supporting structures, unspecified: Secondary | ICD-10-CM | POA: Insufficient documentation

## 2012-12-03 LAB — URINALYSIS, ROUTINE W REFLEX MICROSCOPIC
Bilirubin Urine: NEGATIVE
Glucose, UA: NEGATIVE mg/dL
Ketones, ur: NEGATIVE mg/dL
Specific Gravity, Urine: >1.03 — ABNORMAL HIGH (ref 1.005–1.030)
pH: 6 (ref 5.0–8.0)

## 2012-12-03 LAB — CBC WITH DIFFERENTIAL/PLATELET
Eosinophils Absolute: 0.1 10*3/uL (ref 0.0–0.7)
Hemoglobin: 12.2 g/dL (ref 12.0–15.0)
Lymphocytes Relative: 52 % — ABNORMAL HIGH (ref 12–46)
Lymphs Abs: 2.6 10*3/uL (ref 0.7–4.0)
MCH: 29.4 pg (ref 26.0–34.0)
MCV: 88.2 fL (ref 78.0–100.0)
Monocytes Relative: 8 % (ref 3–12)
Neutrophils Relative %: 37 % — ABNORMAL LOW (ref 43–77)
Platelets: 250 10*3/uL (ref 150–400)
RBC: 4.15 MIL/uL (ref 3.87–5.11)
WBC: 5 10*3/uL (ref 4.0–10.5)

## 2012-12-03 LAB — BASIC METABOLIC PANEL
BUN: 14 mg/dL (ref 6–23)
CO2: 25 mEq/L (ref 19–32)
GFR calc non Af Amer: 84 mL/min — ABNORMAL LOW (ref >90)
Glucose, Bld: 95 mg/dL (ref 70–99)
Potassium: 4.2 mEq/L (ref 3.5–5.1)
Sodium: 138 mEq/L (ref 135–145)

## 2012-12-03 MED ORDER — PENICILLIN V POTASSIUM 500 MG PO TABS: 500.0000 mg | ORAL_TABLET | Freq: Three times a day (TID) | ORAL | Status: DC

## 2012-12-03 MED ORDER — IOHEXOL 300 MG/ML  SOLN
50.0000 mL | Freq: Once | INTRAMUSCULAR | Status: AC | PRN
  Administered 2012-12-03: 50 mL via ORAL

## 2012-12-03 MED ORDER — IBUPROFEN 600 MG PO TABS: 600.0000 mg | ORAL_TABLET | Freq: Four times a day (QID) | ORAL | Status: DC | PRN

## 2012-12-03 MED ORDER — MORPHINE SULFATE 4 MG/ML IJ SOLN
4.0000 mg | Freq: Once | INTRAMUSCULAR | Status: AC
  Administered 2012-12-03: 4 mg via INTRAVENOUS
  Filled 2012-12-03: qty 1

## 2012-12-03 MED ORDER — IOHEXOL 300 MG/ML  SOLN
100.0000 mL | Freq: Once | INTRAMUSCULAR | Status: AC | PRN
  Administered 2012-12-03: 100 mL via INTRAVENOUS

## 2012-12-03 MED ORDER — TRAMADOL HCL 50 MG PO TABS: 50.0000 mg | ORAL_TABLET | Freq: Four times a day (QID) | ORAL | Status: DC | PRN

## 2012-12-03 MED ORDER — ONDANSETRON HCL 4 MG/2ML IJ SOLN
4.0000 mg | Freq: Once | INTRAMUSCULAR | Status: AC
  Administered 2012-12-03: 4 mg via INTRAVENOUS
  Filled 2012-12-03: qty 2

## 2012-12-03 NOTE — ED Notes (Signed)
Patient c/o right upper dental pain. Patient reports taking advil and tylenol with no relief. Patient also c/o mid-low back pain and right lower abd pain. Reports nausea and pressure with urination. Denies any vomiting, diarrhea, or fevers.

## 2012-12-03 NOTE — ED Provider Notes (Signed)
Medical screening examination/treatment/procedure(s) were conducted as a shared visit with non-physician practitioner(s) and myself.  I personally evaluated the patient during the encounter.  No acute abdomen. CT scan shows no appendicitis   Donnetta Hutching, MD 12/03/12 [phone]

## 2012-12-03 NOTE — ED Provider Notes (Signed)
CSN: [id number]     Arrival date & time 12/03/12  1021 History   First MD Initiated Contact with Patient 12/03/12 1029     Chief Complaint  Patient presents with  . Dental Pain  . Back Pain  . Abdominal Pain   (Consider location/radiation/quality/duration/timing/severity/associated sxs/prior Treatment) HPI Comments: Natasha Bean is a 27 y.o. Female presenting with multiple complaints,  Her primary being right lower back pain which has been constant for the past 3 days,  Aching and pressure in quality and has now radiated around to her right lower quadrant today. Movement sometimes makes the pain worse. She reports anorexia this am but has had an appetite until last night,  At which time she had nausea without emesis which has resolved as well.  She reports a pressure sensation in her right lower quadrant with urination,  But denies increased urinary frequency, hematuria, vaginal or suprapubic pain or pressure and denies vaginal discharge.  She does not have a history of kidney stones. Her LMP was 11/15/12 and normal.  She has taken tylenol and ibuprofen without relief of symptoms.       The history is provided by the patient.    Past Medical History  Diagnosis Date  . Asthma   . Anemia   . Migraine    History reviewed. No pertinent past surgical history. Family History  Problem Relation Age of Onset  . Cancer Mother   . Hypertension Father    History  Substance Use Topics  . Smoking status: Current Every Day Smoker -- 0.50 packs/day for 10 years    Types: Cigarettes  . Smokeless tobacco: Never Used  . Alcohol Use: No   OB History   Grav Para Term Preterm Abortions TAB SAB Ect Mult Living            0     Review of Systems  Constitutional: Positive for appetite change.  HENT: Negative for congestion and neck pain.   Eyes: Negative.   Respiratory: Negative for chest tightness.   Musculoskeletal: Negative for joint swelling and arthralgias.  Skin: Negative.  Negative  for rash and wound.  Neurological: Negative for dizziness and light-headedness.  Psychiatric/Behavioral: Negative.     Allergies  Benadryl and Fioricet  Home Medications   Current Outpatient Rx  Name  Route  Sig  Dispense  Refill  . acetaminophen (TYLENOL) 500 MG tablet   Oral   Take 1,000 mg by mouth every 6 (six) hours as needed. pain         . albuterol (PROVENTIL HFA;VENTOLIN HFA) 108 (90 BASE) MCG/ACT inhaler   Inhalation   Inhale 2 puffs into the lungs every 6 (six) hours as needed for wheezing.         Marland Kitchen ibuprofen (ADVIL,MOTRIN) 200 MG tablet   Oral   Take 400 mg by mouth every 6 (six) hours as needed. Pain          BP 120/57  Pulse 62  Temp(Src) 98.4 F (36.9 C) (Oral)  Resp 18  Ht 5\' 2"  (1.575 m)  Wt 174 lb (78.926 kg)  BMI 31.82 kg/m2  SpO2 100%  LMP 11/15/2012 Physical Exam  Nursing note and vitals reviewed. Constitutional: She appears well-developed and well-nourished.  HENT:  Head: Normocephalic and atraumatic.  Mouth/Throat: No trismus in the jaw. Dental abscesses and dental caries present.    Eyes: Conjunctivae are normal.  Neck: Normal range of motion.  Cardiovascular: Normal rate, regular rhythm, normal heart sounds and intact  distal pulses.   Pulmonary/Chest: Effort normal and breath sounds normal.  Abdominal: Soft. Bowel sounds are normal. She exhibits no distension. There is tenderness in the right lower quadrant. There is no guarding, no CVA tenderness and no tenderness at McBurney's point.  Musculoskeletal: Normal range of motion.  Neurological: She is alert.  Skin: Skin is warm and dry.  Psychiatric: She has a normal mood and affect.    ED Course  Procedures (including critical care time) Labs Review Labs Reviewed  URINALYSIS, ROUTINE W REFLEX MICROSCOPIC  PREGNANCY, URINE  CBC WITH DIFFERENTIAL  BASIC METABOLIC PANEL   Imaging Review No results found.  MDM  No diagnosis found.   Labs reviewed,  UA negative, discussed  with Dr. Adriana Simas who will follow pt.  CT abd/pelvis ordered to assess for kidney stone and to r/o appy.    Ct negative except for small ovarian cyst. Prescribed ibuprofen, tramadol, pcn for dental pain,  prob early abscess.  Referrals given.   Burgess Amor, PA-C 12/03/12 1156  Burgess Amor, PA-C 12/03/12 1433

## 2012-12-03 NOTE — ED Notes (Addendum)
C/o low back pain, pressure with urination, and urinary frequency x 2 days; denies n/v/d; c/o toothache "for awhile".

## 2012-12-03 NOTE — ED Notes (Signed)
Instructions, prescriptions and f/u information given/reviewed - verbalizes understanding.  A&ox4; in no apparent distress.

## 2012-12-14 ENCOUNTER — Encounter (HOSPITAL_COMMUNITY): Payer: Self-pay | Admitting: Emergency Medicine

## 2012-12-14 ENCOUNTER — Emergency Department (HOSPITAL_COMMUNITY)
Admission: EM | Admit: 2012-12-14 | Discharge: 2012-12-14 | Disposition: A | Discharge status: Home / Self Care | Payer: Self-pay | Attending: Emergency Medicine | Admitting: Emergency Medicine

## 2012-12-14 DIAGNOSIS — N938 Other specified abnormal uterine and vaginal bleeding: Secondary | ICD-10-CM | POA: Insufficient documentation

## 2012-12-14 DIAGNOSIS — Z79899 Other long term (current) drug therapy: Secondary | ICD-10-CM | POA: Insufficient documentation

## 2012-12-14 DIAGNOSIS — Z8679 Personal history of other diseases of the circulatory system: Secondary | ICD-10-CM | POA: Insufficient documentation

## 2012-12-14 DIAGNOSIS — R350 Frequency of micturition: Secondary | ICD-10-CM | POA: Insufficient documentation

## 2012-12-14 DIAGNOSIS — Z792 Long term (current) use of antibiotics: Secondary | ICD-10-CM | POA: Insufficient documentation

## 2012-12-14 DIAGNOSIS — A5901 Trichomonal vulvovaginitis: Secondary | ICD-10-CM | POA: Insufficient documentation

## 2012-12-14 DIAGNOSIS — R112 Nausea with vomiting, unspecified: Secondary | ICD-10-CM | POA: Insufficient documentation

## 2012-12-14 DIAGNOSIS — J45909 Unspecified asthma, uncomplicated: Secondary | ICD-10-CM | POA: Insufficient documentation

## 2012-12-14 DIAGNOSIS — N949 Unspecified condition associated with female genital organs and menstrual cycle: Secondary | ICD-10-CM | POA: Insufficient documentation

## 2012-12-14 DIAGNOSIS — Z3202 Encounter for pregnancy test, result negative: Secondary | ICD-10-CM | POA: Insufficient documentation

## 2012-12-14 DIAGNOSIS — F17200 Nicotine dependence, unspecified, uncomplicated: Secondary | ICD-10-CM | POA: Insufficient documentation

## 2012-12-14 DIAGNOSIS — Z862 Personal history of diseases of the blood and blood-forming organs and certain disorders involving the immune mechanism: Secondary | ICD-10-CM | POA: Insufficient documentation

## 2012-12-14 LAB — URINE MICROSCOPIC-ADD ON

## 2012-12-14 LAB — URINALYSIS, ROUTINE W REFLEX MICROSCOPIC
Bilirubin Urine: NEGATIVE
Glucose, UA: NEGATIVE mg/dL
Specific Gravity, Urine: >1.03 — ABNORMAL HIGH (ref 1.005–1.030)
pH: 5.5 (ref 5.0–8.0)

## 2012-12-14 LAB — PREGNANCY, URINE: Preg Test, Ur: NEGATIVE

## 2012-12-14 MED ORDER — IBUPROFEN 400 MG PO TABS
600.0000 mg | ORAL_TABLET | Freq: Once | ORAL | Status: AC
  Administered 2012-12-14: 600 mg via ORAL
  Filled 2012-12-14: qty 2

## 2012-12-14 MED ORDER — METRONIDAZOLE 500 MG PO TABS: 500.0000 mg | ORAL_TABLET | Freq: Two times a day (BID) | ORAL | Status: DC

## 2012-12-14 MED ORDER — OXYCODONE-ACETAMINOPHEN 5-325 MG PO TABS
1.0000 | ORAL_TABLET | Freq: Once | ORAL | Status: AC
  Administered 2012-12-14: 1 via ORAL
  Filled 2012-12-14: qty 1

## 2012-12-14 MED ORDER — IBUPROFEN 600 MG PO TABS: 600.0000 mg | ORAL_TABLET | Freq: Three times a day (TID) | ORAL | Status: DC | PRN

## 2012-12-14 NOTE — ED Provider Notes (Signed)
CSN: [id number]     Arrival date & time 12/14/12  0912 History  This chart was scribed for Natasha Co, MD by Dorothey Baseman, ED Scribe. This patient was seen in room [facility]07[facility]07 and the patient's care was started at 9:23 AM.    Chief Complaint  Patient presents with  . Pelvic Pain  . Vaginal Bleeding   The history is provided by the patient. No language interpreter was used.   HPI Comments: Natasha Bean is a 27 y.o. female who presents to the Emergency Department complaining of constant pelvic pain, worse on the right than the left, that radiates down the bilateral legs onset last night, around 9 hours ago, with associated vaginal bleeding. She reports that these symptoms are new for her. She states that she was seen in September and was diagnosed with an ovarian cyst. Patient reports taking Tramadol at home without relief. She reports associated nausea, emesis, and urinary frequency. She denies dyspareunia, hematuria, and dysuria. Patient denies history of pregnancy. She states that she is currently sexually active and does not use contraception. She reports that her LMP was on 11/22/2012 and was normal.   Past Medical History  Diagnosis Date  . Asthma   . Anemia   . Migraine    No past surgical history on file. Family History  Problem Relation Age of Onset  . Cancer Mother   . Hypertension Father    History  Substance Use Topics  . Smoking status: Current Every Day Smoker -- 0.50 packs/day for 10 years    Types: Cigarettes  . Smokeless tobacco: Never Used  . Alcohol Use: No   OB History   Grav Para Term Preterm Abortions TAB SAB Ect Mult Living            0     Review of Systems  A complete 10 system review of systems was obtained and all systems are negative except as noted in the HPI and PMH.   Allergies  Benadryl and Fioricet  Home Medications   Current Outpatient Rx  Name  Route  Sig  Dispense  Refill  . acetaminophen (TYLENOL) 500 MG tablet   Oral   Take  1,000 mg by mouth every 6 (six) hours as needed. pain         . albuterol (PROVENTIL HFA;VENTOLIN HFA) 108 (90 BASE) MCG/ACT inhaler   Inhalation   Inhale 2 puffs into the lungs every 6 (six) hours as needed for wheezing.         Marland Kitchen ibuprofen (ADVIL,MOTRIN) 200 MG tablet   Oral   Take 400 mg by mouth every 6 (six) hours as needed. Pain         . ibuprofen (ADVIL,MOTRIN) 600 MG tablet   Oral   Take 1 tablet (600 mg total) by mouth every 6 (six) hours as needed for pain.   20 tablet   0   . penicillin v potassium (VEETID) 500 MG tablet   Oral   Take 1 tablet (500 mg total) by mouth 3 (three) times daily.   30 tablet   0   . traMADol (ULTRAM) 50 MG tablet   Oral   Take 1 tablet (50 mg total) by mouth every 6 (six) hours as needed for pain.   15 tablet   0    Triage Vitals: BP 122/79  Pulse 61  Temp(Src) 97.6 F (36.4 C) (Oral)  Resp 20  Ht 5\' 2"  (1.575 m)  Wt 174 lb (78.926  kg)  BMI 31.82 kg/m2  SpO2 100%  LMP 12/14/2012  Physical Exam  Nursing note and vitals reviewed. Constitutional: She is oriented to person, place, and time. She appears well-developed and well-nourished.  HENT:  Head: Normocephalic.  Eyes: EOM are normal.  Neck: Normal range of motion.  Cardiovascular: Normal rate, regular rhythm and normal heart sounds.   Pulmonary/Chest: Effort normal and breath sounds normal. No respiratory distress.  Abdominal: She exhibits no distension.  Tenderness to palpation to RLQ and over the right pelvis.   Genitourinary:  Vaginal blood. No clots. No CVA tenderness. No masses. No adnexal fullness. No discharge.  Musculoskeletal: Normal range of motion.  Neurological: She is alert and oriented to person, place, and time.  Psychiatric: She has a normal mood and affect.    ED Course  Procedures (including critical care time)  DIAGNOSTIC STUDIES: Oxygen Saturation is 100% on room air, normal by my interpretation.    COORDINATION OF CARE: 9:27AM- Will  order UA. Discussed treatment plan with patient at bedside and patient verbalized agreement.     Labs Review Labs Reviewed  WET PREP, GENITAL - Abnormal; Notable for the following:    Trich, Wet Prep RARE (*)    Clue Cells Wet Prep HPF POC RARE (*)    WBC, Wet Prep HPF POC RARE (*)    All other components within normal limits  URINALYSIS, ROUTINE W REFLEX MICROSCOPIC - Abnormal; Notable for the following:    Specific Gravity, Urine >1.030 (*)    Hgb urine dipstick LARGE (*)    All other components within normal limits  URINE MICROSCOPIC-ADD ON - Abnormal; Notable for the following:    Squamous Epithelial / LPF FEW (*)    Bacteria, UA FEW (*)    All other components within normal limits  GC/CHLAMYDIA PROBE AMP  PREGNANCY, URINE   Imaging Review No results found.  MDM   1. DUB (dysfunctional uterine bleeding)   2. Trichomonal vaginitis    10:07 AM Pelvic exam without significant tenderness.  Doubt cervicitis or pelvic inflammatory disease.  Likely dysfunctional uterine bleeding with associated pain.  Home with OB/GYN followup and anti-inflammatory medication.  She does have Trichomonas on wet prep.  She be treated with one week of Flagyl.  She was informed of this and told to have all sexual partners evaluated at the health department.  She understands return to the ER for new or worsening symptoms.  Doubt appendicitis.  her pregnancy test is negative  I personally performed the services described in this documentation, which was scribed in my presence. The recorded information has been reviewed and is accurate.      Natasha Co, MD 12/14/12 1009

## 2012-12-14 NOTE — ED Notes (Signed)
Pelvic cart at bedside. Patient prepared. MD aware.

## 2012-12-14 NOTE — ED Notes (Signed)
Patient with no complaints at this time. Respirations even and unlabored. Skin warm/dry. Discharge instructions reviewed with patient at this time. Patient given opportunity to voice concerns/ask questions. Patient discharged at this time and left Emergency Department with steady gait.   

## 2012-12-14 NOTE — ED Notes (Signed)
Patient c/o severe pelvic pain that radiates into legs bilaterally.  Patient started menstruation yesterday and that it started as "spotting" but now she is passing large clots. Patient reports frequency with urination and vomiting. No active vomiting noted at this time.

## 2013-01-21 ENCOUNTER — Encounter: Payer: Self-pay | Admitting: Obstetrics & Gynecology

## 2013-02-23 ENCOUNTER — Emergency Department (HOSPITAL_COMMUNITY): Payer: Self-pay

## 2013-02-23 ENCOUNTER — Emergency Department (HOSPITAL_COMMUNITY)
Admission: EM | Admit: 2013-02-23 | Discharge: 2013-02-23 | Disposition: A | Discharge status: Home / Self Care | Payer: Self-pay | Attending: Emergency Medicine | Admitting: Emergency Medicine

## 2013-02-23 ENCOUNTER — Encounter (HOSPITAL_COMMUNITY): Payer: Self-pay | Admitting: Emergency Medicine

## 2013-02-23 DIAGNOSIS — Z79899 Other long term (current) drug therapy: Secondary | ICD-10-CM | POA: Insufficient documentation

## 2013-02-23 DIAGNOSIS — L089 Local infection of the skin and subcutaneous tissue, unspecified: Secondary | ICD-10-CM | POA: Insufficient documentation

## 2013-02-23 DIAGNOSIS — IMO0002 Reserved for concepts with insufficient information to code with codable children: Secondary | ICD-10-CM | POA: Insufficient documentation

## 2013-02-23 DIAGNOSIS — S90121A Contusion of right lesser toe(s) without damage to nail, initial encounter: Secondary | ICD-10-CM

## 2013-02-23 DIAGNOSIS — Y990 Civilian activity done for income or pay: Secondary | ICD-10-CM | POA: Insufficient documentation

## 2013-02-23 DIAGNOSIS — Y9289 Other specified places as the place of occurrence of the external cause: Secondary | ICD-10-CM | POA: Insufficient documentation

## 2013-02-23 DIAGNOSIS — J45909 Unspecified asthma, uncomplicated: Secondary | ICD-10-CM | POA: Insufficient documentation

## 2013-02-23 DIAGNOSIS — Z862 Personal history of diseases of the blood and blood-forming organs and certain disorders involving the immune mechanism: Secondary | ICD-10-CM | POA: Insufficient documentation

## 2013-02-23 DIAGNOSIS — S90129A Contusion of unspecified lesser toe(s) without damage to nail, initial encounter: Secondary | ICD-10-CM | POA: Insufficient documentation

## 2013-02-23 DIAGNOSIS — Z8679 Personal history of other diseases of the circulatory system: Secondary | ICD-10-CM | POA: Insufficient documentation

## 2013-02-23 DIAGNOSIS — Y9389 Activity, other specified: Secondary | ICD-10-CM | POA: Insufficient documentation

## 2013-02-23 DIAGNOSIS — F17200 Nicotine dependence, unspecified, uncomplicated: Secondary | ICD-10-CM | POA: Insufficient documentation

## 2013-02-23 MED ORDER — TRAMADOL HCL 50 MG PO TABS
50.0000 mg | ORAL_TABLET | Freq: Once | ORAL | Status: AC
  Administered 2013-02-23: 50 mg via ORAL
  Filled 2013-02-23: qty 1

## 2013-02-23 MED ORDER — SULFAMETHOXAZOLE-TMP DS 800-160 MG PO TABS
1.0000 | ORAL_TABLET | Freq: Once | ORAL | Status: AC
  Administered 2013-02-23: 1 via ORAL
  Filled 2013-02-23: qty 1

## 2013-02-23 MED ORDER — TRAMADOL HCL 50 MG PO TABS: 50.0000 mg | ORAL_TABLET | Freq: Four times a day (QID) | ORAL | Status: DC | PRN

## 2013-02-23 MED ORDER — SULFAMETHOXAZOLE-TMP DS 800-160 MG PO TABS: 1.0000 | ORAL_TABLET | Freq: Two times a day (BID) | ORAL | Status: DC

## 2013-02-23 NOTE — ED Notes (Signed)
Hit right great toe while at work x 2 wks ago. Reports increased pain, color change and swelling to area since.

## 2013-02-26 NOTE — ED Provider Notes (Signed)
CSN: [id number]     Arrival date & time 02/23/13  4098 History   First MD Initiated Contact with Patient 02/23/13 0857     Chief Complaint  Patient presents with  . Toe Pain   (Consider location/radiation/quality/duration/timing/severity/associated sxs/prior Treatment) HPI Comments: Natasha Bean is a 27 y.o. Female with increased pain,  Swelling, now reddened skin along the lateral edge of her right great toe since she hit it accidentally at work about 2 weeks ago.  Pain is aching and triggered by palpation and walking and has had no radiation of pain.  She has found no alleviators for her symptoms.     The history is provided by the patient.    Past Medical History  Diagnosis Date  . Asthma   . Anemia   . Migraine    History reviewed. No pertinent past surgical history. Family History  Problem Relation Age of Onset  . Cancer Mother   . Hypertension Father    History  Substance Use Topics  . Smoking status: Current Every Day Smoker -- 0.50 packs/day for 10 years    Types: Cigarettes  . Smokeless tobacco: Never Used  . Alcohol Use: No   OB History   Grav Para Term Preterm Abortions TAB SAB Ect Mult Living            0     Review of Systems  Constitutional: Negative for fever.  Musculoskeletal: Positive for arthralgias. Negative for joint swelling and myalgias.  Neurological: Negative for weakness and numbness.    Allergies  Benadryl and Fioricet  Home Medications   Current Outpatient Rx  Name  Route  Sig  Dispense  Refill  . acetaminophen (TYLENOL) 500 MG tablet   Oral   Take 1,000 mg by mouth every 6 (six) hours as needed. pain         . albuterol (PROVENTIL HFA;VENTOLIN HFA) 108 (90 BASE) MCG/ACT inhaler   Inhalation   Inhale 2 puffs into the lungs every 6 (six) hours as needed for wheezing.         . sulfamethoxazole-trimethoprim (BACTRIM DS) 800-160 MG per tablet   Oral   Take 1 tablet by mouth 2 (two) times daily.   20 tablet   0   .  traMADol (ULTRAM) 50 MG tablet   Oral   Take 1 tablet (50 mg total) by mouth every 6 (six) hours as needed.   15 tablet   0    BP 127/95  Pulse 74  Temp(Src) 97.4 F (36.3 C) (Oral)  Resp 18  SpO2 100%  LMP 02/09/2013 Physical Exam  Constitutional: She appears well-developed and well-nourished.  HENT:  Head: Atraumatic.  Neck: Normal range of motion.  Cardiovascular:  Pulses equal bilaterally  Musculoskeletal: She exhibits tenderness.       Right foot: She exhibits tenderness and swelling. She exhibits normal capillary refill.  Slight swelling right lateral toe, erythema.  No fluctuance of the cuticle.  Less than 3 sec cap refill.  Thickened and darkened right great toenail.  Neurological: She is alert. She has normal strength. She displays normal reflexes. No sensory deficit.  Equal strength  Skin: Skin is warm and dry.  Psychiatric: She has a normal mood and affect.    ED Course  Procedures (including critical care time) Labs Review Labs Reviewed - No data to display Imaging Review No results found.  EKG Interpretation   None       MDM   1. Toe contusion, right,  initial encounter   2. Toe infection    Patients labs and/or radiological studies were viewed and considered during the medical decision making and disposition process. Pt prescribed bactrim, tramadol.  Post op shoe provided.  F/u with pcp prn, referrals given.    Burgess Amor, PA-C 02/26/13 2306

## 2013-02-27 NOTE — ED Provider Notes (Signed)
Medical screening examination/treatment/procedure(s) were performed by non-physician practitioner and as supervising physician I was immediately available for consultation/collaboration.  EKG Interpretation   None        Joretta Eads, MD 02/27/13 1820 

## 2013-05-15 ENCOUNTER — Emergency Department (HOSPITAL_COMMUNITY)
Admission: EM | Admit: 2013-05-15 | Discharge: 2013-05-15 | Disposition: A | Discharge status: Home / Self Care | Payer: Self-pay | Attending: Emergency Medicine | Admitting: Emergency Medicine

## 2013-05-15 ENCOUNTER — Encounter (HOSPITAL_COMMUNITY): Payer: Self-pay | Admitting: Emergency Medicine

## 2013-05-15 ENCOUNTER — Emergency Department (HOSPITAL_COMMUNITY): Payer: Self-pay

## 2013-05-15 DIAGNOSIS — Z8679 Personal history of other diseases of the circulatory system: Secondary | ICD-10-CM | POA: Insufficient documentation

## 2013-05-15 DIAGNOSIS — J45909 Unspecified asthma, uncomplicated: Secondary | ICD-10-CM | POA: Insufficient documentation

## 2013-05-15 DIAGNOSIS — M79676 Pain in unspecified toe(s): Secondary | ICD-10-CM

## 2013-05-15 DIAGNOSIS — B351 Tinea unguium: Secondary | ICD-10-CM | POA: Insufficient documentation

## 2013-05-15 DIAGNOSIS — Z862 Personal history of diseases of the blood and blood-forming organs and certain disorders involving the immune mechanism: Secondary | ICD-10-CM | POA: Insufficient documentation

## 2013-05-15 DIAGNOSIS — M79609 Pain in unspecified limb: Secondary | ICD-10-CM | POA: Insufficient documentation

## 2013-05-15 DIAGNOSIS — Z79899 Other long term (current) drug therapy: Secondary | ICD-10-CM | POA: Insufficient documentation

## 2013-05-15 DIAGNOSIS — F17200 Nicotine dependence, unspecified, uncomplicated: Secondary | ICD-10-CM | POA: Insufficient documentation

## 2013-05-15 DIAGNOSIS — G8929 Other chronic pain: Secondary | ICD-10-CM | POA: Insufficient documentation

## 2013-05-15 MED ORDER — IBUPROFEN 600 MG PO TABS: 600.0000 mg | ORAL_TABLET | Freq: Four times a day (QID) | ORAL | Status: DC | PRN

## 2013-05-15 MED ORDER — SULFAMETHOXAZOLE-TRIMETHOPRIM 800-160 MG PO TABS: 1.0000 | ORAL_TABLET | Freq: Two times a day (BID) | ORAL | Status: DC

## 2013-05-15 NOTE — ED Provider Notes (Signed)
Medical screening examination/treatment/procedure(s) were performed by non-physician practitioner and as supervising physician I was immediately available for consultation/collaboration.   EKG Interpretation None       Donnetta HutchingBrian Magdalyn Arenivas, MD 05/15/13 1536

## 2013-05-15 NOTE — ED Provider Notes (Signed)
CSN: [id number]     Arrival date & time 05/15/13  0720 History   First MD Initiated Contact with Patient 05/15/13 0802     Chief Complaint  Patient presents with  . Toe Pain     (Consider location/radiation/quality/duration/timing/severity/associated sxs/prior Treatment) HPI Comments: Natasha Bean is a 28 y.o. Female presenting with chronic pain in her right great toe which has been present for at least the past year, which has worsened the past week.  Her pain is throbbing, constant and she states she feels like the "circulation is being cut".  She denies trauma, swelling, changes in coloration or any skin lesions.  She does have onychomycosis of this toenail which has been present for " a long time".  She has found no alleviators.  Movement and palpation worsens her pain.       The history is provided by the patient.    Past Medical History  Diagnosis Date  . Asthma   . Anemia   . Migraine    History reviewed. No pertinent past surgical history. Family History  Problem Relation Age of Onset  . Cancer Mother   . Hypertension Father   . Diabetes Father    History  Substance Use Topics  . Smoking status: Current Every Day Smoker -- 0.50 packs/day for 10 years    Types: Cigarettes  . Smokeless tobacco: Never Used  . Alcohol Use: No   OB History   Grav Para Term Preterm Abortions TAB SAB Ect Mult Living            0     Review of Systems  Constitutional: Negative for fever.  Musculoskeletal: Positive for arthralgias. Negative for joint swelling and myalgias.  Neurological: Negative for weakness and numbness.      Allergies  Benadryl and Fioricet  Home Medications   Current Outpatient Rx  Name  Route  Sig  Dispense  Refill  . acetaminophen (TYLENOL) 500 MG tablet   Oral   Take 1,000 mg by mouth every 6 (six) hours as needed. pain         . albuterol (PROVENTIL HFA;VENTOLIN HFA) 108 (90 BASE) MCG/ACT inhaler   Inhalation   Inhale 2 puffs into the lungs  every 6 (six) hours as needed for wheezing.         Marland Kitchen ibuprofen (ADVIL,MOTRIN) 600 MG tablet   Oral   Take 1 tablet (600 mg total) by mouth every 6 (six) hours as needed.   30 tablet   0   . sulfamethoxazole-trimethoprim (BACTRIM DS) 800-160 MG per tablet   Oral   Take 1 tablet by mouth 2 (two) times daily.   20 tablet   0   . sulfamethoxazole-trimethoprim (SEPTRA DS) 800-160 MG per tablet   Oral   Take 1 tablet by mouth every 12 (twelve) hours.   20 tablet   0   . traMADol (ULTRAM) 50 MG tablet   Oral   Take 1 tablet (50 mg total) by mouth every 6 (six) hours as needed.   15 tablet   0    BP 119/80  Pulse 56  Temp(Src) 97.9 F (36.6 C) (Oral)  Resp 20  Ht 5\' 2"  (1.575 m)  Wt 179 lb (81.194 kg)  BMI 32.73 kg/m2  SpO2 100%  LMP 05/10/2012 Physical Exam  Constitutional: She appears well-developed and well-nourished.  HENT:  Head: Atraumatic.  Neck: Normal range of motion.  Cardiovascular:  Pulses:      Dorsalis pedis pulses are  2+ on the right side, and 2+ on the left side.  Pulses equal bilaterally. Less than 3 sec cap refill in toes.  Musculoskeletal: She exhibits tenderness.  Advanced onychomycosis of great toenail right.  Several other toes with lesser degrees of infection.  At rest toe is held in extension with extensor hallucis longus prominent, tender.  Slight  erythema of lateral cuticle. No drainage, no edema, induration, fluctuance.    Neurological: She is alert. She has normal strength. She displays normal reflexes. No sensory deficit.  Skin: Skin is warm and dry.  Psychiatric: She has a normal mood and affect.    ED Course  Procedures (including critical care time) Labs Review Labs Reviewed - No data to display Imaging Review Dg Foot Complete Right  05/15/2013   CLINICAL DATA:  TOE PAIN  EXAM: RIGHT FOOT COMPLETE - 3+ VIEW  COMPARISON:  None.  FINDINGS: There is no evidence of fracture or dislocation. There is no evidence of arthropathy or other  focal bone abnormality. Soft tissues are unremarkable.  IMPRESSION: Negative.   Electronically Signed   By: Salome HolmesHector  Cooper M.D.   On: 05/15/2013 08:38     EKG Interpretation None      MDM   Final diagnoses:  Great toe pain  Onychomycosis of toenail    Pt prescribed bactrim with concern for possible early bacterial cellulitis surrounding the fungal infection.  She was referred to Dr. Pricilla Holmucker for further management of the fungal infection,  Also given referral for primary care.  The patient appears reasonably screened and/or stabilized for discharge and I doubt any other medical condition or other [facility] requiring further screening, evaluation, or treatment in the ED at this time prior to discharge.     Burgess AmorJulie Shanayah Kaffenberger, PA-C 05/15/13 [phone]

## 2013-05-15 NOTE — Discharge Instructions (Signed)
Ringworm, Nail A fungal infection of the nail (tinea unguium/onychomycosis) is common. It is common as the visible part of the nail is composed of dead cells which have no blood supply to help prevent infection. It occurs because fungi are everywhere and will pick any opportunity to grow on any dead material. Because nails are very slow growing they require up to 2 years of treatment with anti-fungal medications. The entire nail back to the base is infected. This includes approximately  of the nail which you cannot see. If your caregiver has prescribed a medication by mouth, take it every day and as directed. No progress will be seen for at least 6 to 9 months. Do not be disappointed! Because fungi live on dead cells with little or no exposure to blood supply, medication delivery to the infection is slow; thus the cure is slow. It is also why you can observe no progress in the first 6 months. The nail becoming cured is the base of the nail, as it has the blood supply. Topical medication such as creams and ointments are usually not effective. Important in successful treatment of nail fungus is closely following the medication regimen that your doctor prescribes. Sometimes you and your caregiver may elect to speed up this process by surgical removal of all the nails. Even this may still require 6 to 9 months of additional oral medications. See your caregiver as directed. Remember there will be no visible improvement for at least 6 months. See your caregiver sooner if other signs of infection (redness and swelling) develop. Document Released: 02/24/2000 Document Revised: 05/21/2011 Document Reviewed: 05/04/2008 [facility] Patient Information 2014 [place].   You have a toenail fungal infection as discussed.  Please call Dr Pricilla Holmucker for definitive treatment of this condition.  I am concerned for possible bacterial infection of the skin around your nail as the source of this new pain and redness so you are  being prescribed an antibiotic for this.  Take the entire course of this medication.  Get rechecked for any worsened symptoms.

## 2013-05-15 NOTE — ED Notes (Signed)
Patient states that her right toe has been hurting for the past couple of months but the pain has increased dramatically in the past week. She states that "she feels like it is cutting off her circulation."

## 2013-07-08 ENCOUNTER — Encounter (HOSPITAL_COMMUNITY): Payer: Self-pay | Admitting: Emergency Medicine

## 2013-07-08 ENCOUNTER — Emergency Department (HOSPITAL_COMMUNITY): Payer: Self-pay

## 2013-07-08 ENCOUNTER — Emergency Department (HOSPITAL_COMMUNITY)
Admission: EM | Admit: 2013-07-08 | Discharge: 2013-07-08 | Disposition: A | Discharge status: Home / Self Care | Payer: Self-pay | Attending: Emergency Medicine | Admitting: Emergency Medicine

## 2013-07-08 DIAGNOSIS — Z8679 Personal history of other diseases of the circulatory system: Secondary | ICD-10-CM | POA: Insufficient documentation

## 2013-07-08 DIAGNOSIS — R110 Nausea: Secondary | ICD-10-CM | POA: Insufficient documentation

## 2013-07-08 DIAGNOSIS — R630 Anorexia: Secondary | ICD-10-CM | POA: Insufficient documentation

## 2013-07-08 DIAGNOSIS — Z3202 Encounter for pregnancy test, result negative: Secondary | ICD-10-CM | POA: Insufficient documentation

## 2013-07-08 DIAGNOSIS — R1031 Right lower quadrant pain: Secondary | ICD-10-CM | POA: Insufficient documentation

## 2013-07-08 DIAGNOSIS — A499 Bacterial infection, unspecified: Secondary | ICD-10-CM | POA: Insufficient documentation

## 2013-07-08 DIAGNOSIS — M549 Dorsalgia, unspecified: Secondary | ICD-10-CM | POA: Insufficient documentation

## 2013-07-08 DIAGNOSIS — F17200 Nicotine dependence, unspecified, uncomplicated: Secondary | ICD-10-CM | POA: Insufficient documentation

## 2013-07-08 DIAGNOSIS — B9689 Other specified bacterial agents as the cause of diseases classified elsewhere: Secondary | ICD-10-CM | POA: Insufficient documentation

## 2013-07-08 DIAGNOSIS — N760 Acute vaginitis: Secondary | ICD-10-CM | POA: Insufficient documentation

## 2013-07-08 DIAGNOSIS — Z862 Personal history of diseases of the blood and blood-forming organs and certain disorders involving the immune mechanism: Secondary | ICD-10-CM | POA: Insufficient documentation

## 2013-07-08 DIAGNOSIS — Z79899 Other long term (current) drug therapy: Secondary | ICD-10-CM | POA: Insufficient documentation

## 2013-07-08 DIAGNOSIS — Z791 Long term (current) use of non-steroidal anti-inflammatories (NSAID): Secondary | ICD-10-CM | POA: Insufficient documentation

## 2013-07-08 DIAGNOSIS — J45909 Unspecified asthma, uncomplicated: Secondary | ICD-10-CM | POA: Insufficient documentation

## 2013-07-08 LAB — URINALYSIS, ROUTINE W REFLEX MICROSCOPIC
GLUCOSE, UA: NEGATIVE mg/dL
Ketones, ur: NEGATIVE mg/dL
Leukocytes, UA: NEGATIVE
Nitrite: NEGATIVE
PH: 5.5 (ref 5.0–8.0)
Protein, ur: 100 mg/dL — AB
Urobilinogen, UA: 0.2 mg/dL (ref 0.0–1.0)

## 2013-07-08 LAB — CBC WITH DIFFERENTIAL/PLATELET
BASOS ABS: 0 10*3/uL (ref 0.0–0.1)
Basophils Relative: 0 % (ref 0–1)
EOS ABS: 0.2 10*3/uL (ref 0.0–0.7)
EOS PCT: 3 % (ref 0–5)
HCT: 35.6 % — ABNORMAL LOW (ref 36.0–46.0)
Hemoglobin: 11.8 g/dL — ABNORMAL LOW (ref 12.0–15.0)
LYMPHS PCT: 55 % — AB (ref 12–46)
Lymphs Abs: 3 10*3/uL (ref 0.7–4.0)
MCH: 29.2 pg (ref 26.0–34.0)
MCHC: 33.1 g/dL (ref 30.0–36.0)
MCV: 88.1 fL (ref 78.0–100.0)
Monocytes Absolute: 0.4 10*3/uL (ref 0.1–1.0)
Monocytes Relative: 7 % (ref 3–12)
Neutro Abs: 1.9 10*3/uL (ref 1.7–7.7)
Neutrophils Relative %: 35 % — ABNORMAL LOW (ref 43–77)
Platelets: 276 10*3/uL (ref 150–400)
RBC: 4.04 MIL/uL (ref 3.87–5.11)
RDW: 13.2 % (ref 11.5–15.5)
WBC: 5.5 10*3/uL (ref 4.0–10.5)

## 2013-07-08 LAB — WET PREP, GENITAL
Trich, Wet Prep: NONE SEEN
YEAST WET PREP: NONE SEEN

## 2013-07-08 LAB — COMPREHENSIVE METABOLIC PANEL
ALBUMIN: 3.5 g/dL (ref 3.5–5.2)
ALT: 9 U/L (ref 0–35)
AST: 13 U/L (ref 0–37)
Alkaline Phosphatase: 40 U/L (ref 39–117)
BUN: 7 mg/dL (ref 6–23)
CALCIUM: 9 mg/dL (ref 8.4–10.5)
CO2: 23 mEq/L (ref 19–32)
Chloride: 106 mEq/L (ref 96–112)
Creatinine, Ser: 0.82 mg/dL (ref 0.50–1.10)
GFR calc Af Amer: >90 mL/min (ref >90)
GFR calc non Af Amer: >90 mL/min (ref >90)
Glucose, Bld: 91 mg/dL (ref 70–99)
Potassium: 4.1 mEq/L (ref 3.7–5.3)
SODIUM: 141 meq/L (ref 137–147)
Total Bilirubin: 0.5 mg/dL (ref 0.3–1.2)
Total Protein: 6.5 g/dL (ref 6.0–8.3)

## 2013-07-08 LAB — URINE MICROSCOPIC-ADD ON

## 2013-07-08 LAB — PREGNANCY, URINE: Preg Test, Ur: NEGATIVE

## 2013-07-08 LAB — HIV ANTIBODY (ROUTINE TESTING W REFLEX): HIV 1&2 Ab, 4th Generation: NONREACTIVE

## 2013-07-08 LAB — RPR

## 2013-07-08 MED ORDER — ONDANSETRON HCL 4 MG/2ML IJ SOLN
4.0000 mg | Freq: Once | INTRAMUSCULAR | Status: AC
  Administered 2013-07-08: 4 mg via INTRAVENOUS
  Filled 2013-07-08: qty 2

## 2013-07-08 MED ORDER — SODIUM CHLORIDE 0.9 % IV SOLN
INTRAVENOUS | Status: DC
  Administered 2013-07-08: 18:00:00 via INTRAVENOUS

## 2013-07-08 MED ORDER — METRONIDAZOLE 500 MG PO TABS: 500.0000 mg | ORAL_TABLET | Freq: Three times a day (TID) | ORAL | Status: DC

## 2013-07-08 MED ORDER — SODIUM CHLORIDE 0.9 % IV BOLUS (SEPSIS)
1000.0000 mL | Freq: Once | INTRAVENOUS | Status: AC
  Administered 2013-07-08: 1000 mL via INTRAVENOUS

## 2013-07-08 MED ORDER — ONDANSETRON HCL 4 MG PO TABS: 4.0000 mg | ORAL_TABLET | Freq: Three times a day (TID) | ORAL | Status: DC | PRN

## 2013-07-08 MED ORDER — IOHEXOL 300 MG/ML  SOLN
100.0000 mL | Freq: Once | INTRAMUSCULAR | Status: AC | PRN
  Administered 2013-07-08: 100 mL via INTRAVENOUS

## 2013-07-08 MED ORDER — IOHEXOL 300 MG/ML  SOLN
25.0000 mL | Freq: Once | INTRAMUSCULAR | Status: AC | PRN
  Administered 2013-07-08: 25 mL via ORAL

## 2013-07-08 MED ORDER — NAPROXEN 500 MG PO TABS: 500.0000 mg | ORAL_TABLET | Freq: Two times a day (BID) | ORAL | Status: DC

## 2013-07-08 MED ORDER — FENTANYL CITRATE 0.05 MG/ML IJ SOLN
50.0000 ug | Freq: Once | INTRAMUSCULAR | Status: AC
Start: 2013-07-08 — End: 2013-07-08
  Administered 2013-07-08: 50 ug via INTRAVENOUS
  Filled 2013-07-08: qty 2

## 2013-07-08 NOTE — ED Provider Notes (Signed)
CSN: [id number]     Arrival date & time 07/08/13  1519 History  This chart was scribed for Ward Givens, MD by Charline Bills, ED Scribe. The patient was seen in room APA12/APA12. Patient's care was started at 5:46 PM.      Chief Complaint  Patient presents with  . Abdominal Pain    The history is provided by the patient. No language interpreter was used.   HPI Comments: Natasha Bean is a 28 y.o. female who presents to the Emergency Department complaining of lower abdominal pain that is worse on the R side onset 3 days ago. Pt also reports associated lower back pain and pain in her legs. She also reports nausea and loss of appetite. She denies vomiting and fever. She also denies urinary frequency, dysuria and vaginal discharge. LNMP 06/03/13. However she started spotting today.  Pt is sexually active and does not use any protection. Pt has never been pregnant, G0P0 and does not believe that she is currently pregnant. She has had similar pain and was diagnosed with a small ovarian cyst but reports worse pain this time. Pt does not take daily medications. She smokes 0.5 ppd. Pt does use alcohol. Pt has worked as a Optician, dispensing for approximately 1 year.   PCP [facility]  Past Medical History  Diagnosis Date  . Asthma   . Anemia   . Migraine    History reviewed. No pertinent past surgical history. Family History  Problem Relation Age of Onset  . Cancer Mother   . Hypertension Father   . Diabetes Father    History  Substance Use Topics  . Smoking status: Current Every Day Smoker -- 0.50 packs/day for 10 years    Types: Cigarettes  . Smokeless tobacco: Never Used  . Alcohol Use: No   employed  OB History   Grav Para Term Preterm Abortions TAB SAB Ect Mult Living            0     Review of Systems  Constitutional: Positive for appetite change. Negative for fever.  Gastrointestinal: Positive for nausea and abdominal pain. Negative for vomiting.  Genitourinary:  Negative for dysuria, frequency and vaginal discharge.  Musculoskeletal: Positive for back pain.  All other systems reviewed and are negative.   Allergies  Benadryl and Fioricet  Home Medications   Prior to Admission medications   Medication Sig Start Date End Date Taking? Authorizing Provider  acetaminophen (TYLENOL) 500 MG tablet Take 1,000 mg by mouth every 6 (six) hours as needed. pain    Historical Provider, MD  albuterol (PROVENTIL HFA;VENTOLIN HFA) 108 (90 BASE) MCG/ACT inhaler Inhale 2 puffs into the lungs every 6 (six) hours as needed for wheezing.    Historical Provider, MD  ibuprofen (ADVIL,MOTRIN) 600 MG tablet Take 1 tablet (600 mg total) by mouth every 6 (six) hours as needed. 05/15/13   Burgess Amor, PA-C  sulfamethoxazole-trimethoprim (BACTRIM DS) 800-160 MG per tablet Take 1 tablet by mouth 2 (two) times daily. 02/23/13   Burgess Amor, PA-C  sulfamethoxazole-trimethoprim (SEPTRA DS) 800-160 MG per tablet Take 1 tablet by mouth every 12 (twelve) hours. 05/15/13   Burgess Amor, PA-C  traMADol (ULTRAM) 50 MG tablet Take 1 tablet (50 mg total) by mouth every 6 (six) hours as needed. 02/23/13   Burgess Amor, PA-C   Triage Vitals: BP 129/92  Pulse 83  Temp(Src) 97.9 F (36.6 C) (Oral)  Resp 16  Ht 5\' 2"  (1.575 m)  Wt 174  lb 8 oz (79.153 kg)  BMI 31.91 kg/m2  SpO2 100%  LMP 07/08/2013  Vital signs normal   Physical Exam  Nursing note and vitals reviewed. Constitutional: She is oriented to person, place, and time. She appears well-developed and well-nourished.  Non-toxic appearance. She does not appear ill. No distress.  HENT:  Head: Normocephalic and atraumatic.  Right Ear: External ear normal.  Left Ear: External ear normal.  Nose: Nose normal. No mucosal edema or rhinorrhea.  Mouth/Throat: Oropharynx is clear and moist and mucous membranes are normal. No dental abscesses or uvula swelling.  Eyes: Conjunctivae and EOM are normal. Pupils are equal, round, and reactive to light.   Neck: Normal range of motion and full passive range of motion without pain. Neck supple.  Cardiovascular: Normal rate, regular rhythm and normal heart sounds.  Exam reveals no gallop and no friction rub.   No murmur heard. Pulmonary/Chest: Effort normal and breath sounds normal. No respiratory distress. She has no wheezes. She has no rhonchi. She has no rales. She exhibits no tenderness and no crepitus.  Abdominal: Soft. Normal appearance and bowel sounds are normal. She exhibits no distension. There is tenderness. There is no rebound and no guarding.    Tenderness diffusely in lower abdomen Most tender in RLQ Tenderness to percussion Pain in RLQ with knee tapping but not with psoas  sign  Genitourinary:  Normal external genitalia Blood in the vault Nontender uterus and adnexa Tender superior to R ovary   Musculoskeletal: Normal range of motion. She exhibits no edema and no tenderness.  Moves all extremities well.   Neurological: She is alert and oriented to person, place, and time. She has normal strength. No cranial nerve deficit.  Skin: Skin is warm, dry and intact. No rash noted. No erythema. No pallor.  Psychiatric: She has a normal mood and affect. Her speech is normal and behavior is normal. Her mood appears not anxious.    ED Course  Procedures (including critical care time)  Medications  0.9 %  sodium chloride infusion ( Intravenous Stopped 07/08/13 2149)  sodium chloride 0.9 % bolus 1,000 mL (0 mLs Intravenous Stopped 07/08/13 2149)  fentaNYL (SUBLIMAZE) injection 50 mcg (50 mcg Intravenous Given 07/08/13 1922)  ondansetron (ZOFRAN) injection 4 mg (4 mg Intravenous Given 07/08/13 1920)  iohexol (OMNIPAQUE) 300 MG/ML solution 25 mL (25 mLs Oral Contrast Given 07/08/13 2005)  iohexol (OMNIPAQUE) 300 MG/ML solution 100 mL (100 mLs Intravenous Contrast Given 07/08/13 2005)    DIAGNOSTIC STUDIES: Oxygen Saturation is 100% on RA, normal by my interpretation.    COORDINATION OF  CARE: 5:52 PM-Discussed treatment plan which includes UA with pt at bedside and pt agreed to plan.   6:47 PM- Performed pelvic exam with a chaperone.   Patient has been requesting something to eat.  9:21 PM- Discussed normal findings with pt and minor vaginal infection.  Labs Review Results for orders placed during the hospital encounter of 07/08/13  WET PREP, GENITAL      Result Value Ref Range   Yeast Wet Prep HPF POC NONE SEEN  NONE SEEN   Trich, Wet Prep NONE SEEN  NONE SEEN   Clue Cells Wet Prep HPF POC RARE (*) NONE SEEN   WBC, Wet Prep HPF POC FEW (*) NONE SEEN  URINALYSIS, ROUTINE W REFLEX MICROSCOPIC      Result Value Ref Range   Color, Urine AMBER (*) YELLOW   APPearance CLEAR  CLEAR   Specific Gravity, Urine >1.030 (*) 1.005 -  1.030   pH 5.5  5.0 - 8.0   Glucose, UA NEGATIVE  NEGATIVE mg/dL   Hgb urine dipstick LARGE (*) NEGATIVE   Bilirubin Urine SMALL (*) NEGATIVE   Ketones, ur NEGATIVE  NEGATIVE mg/dL   Protein, ur 161 (*) NEGATIVE mg/dL   Urobilinogen, UA 0.2  0.0 - 1.0 mg/dL   Nitrite NEGATIVE  NEGATIVE   Leukocytes, UA NEGATIVE  NEGATIVE  PREGNANCY, URINE      Result Value Ref Range   Preg Test, Ur NEGATIVE  NEGATIVE  URINE MICROSCOPIC-ADD ON      Result Value Ref Range   Squamous Epithelial / LPF MANY (*) RARE   WBC, UA 7-10  <3 WBC/hpf   RBC / HPF 3-6  <3 RBC/hpf   Bacteria, UA FEW (*) RARE  CBC WITH DIFFERENTIAL      Result Value Ref Range   WBC 5.5  4.0 - 10.5 K/uL   RBC 4.04  3.87 - 5.11 MIL/uL   Hemoglobin 11.8 (*) 12.0 - 15.0 g/dL   HCT 09.6 (*) 04.5 - 40.9 %   MCV 88.1  78.0 - 100.0 fL   MCH 29.2  26.0 - 34.0 pg   MCHC 33.1  30.0 - 36.0 g/dL   RDW 81.1  91.4 - 78.2 %   Platelets 276  150 - 400 K/uL   Neutrophils Relative % 35 (*) 43 - 77 %   Neutro Abs 1.9  1.7 - 7.7 K/uL   Lymphocytes Relative 55 (*) 12 - 46 %   Lymphs Abs 3.0  0.7 - 4.0 K/uL   Monocytes Relative 7  3 - 12 %   Monocytes Absolute 0.4  0.1 - 1.0 K/uL   Eosinophils  Relative 3  0 - 5 %   Eosinophils Absolute 0.2  0.0 - 0.7 K/uL   Basophils Relative 0  0 - 1 %   Basophils Absolute 0.0  0.0 - 0.1 K/uL  COMPREHENSIVE METABOLIC PANEL      Result Value Ref Range   Sodium 141  137 - 147 mEq/L   Potassium 4.1  3.7 - 5.3 mEq/L   Chloride 106  96 - 112 mEq/L   CO2 23  19 - 32 mEq/L   Glucose, Bld 91  70 - 99 mg/dL   BUN 7  6 - 23 mg/dL   Creatinine, Ser 9.56  0.50 - 1.10 mg/dL   Calcium 9.0  8.4 - 21.3 mg/dL   Total Protein 6.5  6.0 - 8.3 g/dL   Albumin 3.5  3.5 - 5.2 g/dL   AST 13  0 - 37 U/L   ALT 9  0 - 35 U/L   Alkaline Phosphatase 40  39 - 117 U/L   Total Bilirubin 0.5  0.3 - 1.2 mg/dL   GFR calc non Af Amer >90  >90 mL/min   GFR calc Af Amer >90  >90 mL/min  RPR      Result Value Ref Range   RPR NON REAC  NON REAC  HIV ANTIBODY (ROUTINE TESTING)      Result Value Ref Range   HIV 1&2 Ab, 4th Generation NONREACTIVE  NONREACTIVE     Laboratory interpretation all normal except bacterial vaginosis, contaminated urine from voided urine in patient with menses  Imaging Review Ct Abdomen Pelvis W Contrast  07/08/2013   CLINICAL DATA:  Lower abdominal pain.  EXAM: CT ABDOMEN AND PELVIS WITH CONTRAST  TECHNIQUE: Multidetector CT imaging of the abdomen and pelvis was performed using  the standard protocol following bolus administration of intravenous contrast.  CONTRAST:  100mL OMNIPAQUE IOHEXOL 300 MG/ML SOLN, 25mL OMNIPAQUE IOHEXOL 300 MG/ML SOLN  COMPARISON:  CT scan of December 03, 2012.  FINDINGS: Visualized lung bases appear normal. No osseous abnormality is noted.  No gallstones are noted. The liver, spleen and pancreas appear normal. Adrenal glands and kidneys appear normal. No hydronephrosis or renal obstruction is noted. There is no evidence of bowel obstruction. Urinary bladder and uterus appear normal. No abnormal fluid collection is noted. No significant adenopathy is noted. The appendix is not well visualized, but no inflammation is seen in the  right lower quadrant.   Electronically Signed   By: Roque LiasJames  Green M.D.   On: 07/08/2013 20:39     EKG Interpretation None      MDM   Final diagnoses:  RLQ abdominal pain  BV (bacterial vaginosis)    Discharge Medication List as of 07/08/2013  9:46 PM    START taking these medications   Details  metroNIDAZOLE (FLAGYL) 500 MG tablet Take 1 tablet (500 mg total) by mouth 3 (three) times daily., Starting 07/08/2013, Until Discontinued, Print    naproxen (NAPROSYN) 500 MG tablet Take 1 tablet (500 mg total) by mouth 2 (two) times daily., Starting 07/08/2013, Until Discontinued, Print    ondansetron (ZOFRAN) 4 MG tablet Take 1 tablet (4 mg total) by mouth every 8 (eight) hours as needed for nausea or vomiting., Starting 07/08/2013, Until Discontinued, Print       Plan discharge  Devoria AlbeIva Raheen Capili, MD, FACEP   I personally performed the services described in this documentation, which was scribed in my presence. The recorded information has been reviewed and considered.  Devoria AlbeIva Dorn Hartshorne, MD, Armando GangFACEP    Ward GivensIva L Adisa Vigeant, MD 07/08/13 [phone]

## 2013-07-08 NOTE — ED Notes (Signed)
Lower abd pain and lower back pain x 4 days with nausea.  Denies v/d, denies GU sx.

## 2013-07-08 NOTE — Discharge Instructions (Signed)
Drink plenty of fluids. Take the antibiotics until gone. Take the zofran if needed for nausea and the naproxen for pain if needed.  Recheck if you seem worse, such as fever, uncontrolled vomiting or pain.    Bacterial Vaginosis Bacterial vaginosis is a vaginal infection that occurs when the normal balance of bacteria in the vagina is disrupted. It results from an overgrowth of certain bacteria. This is the most common vaginal infection in women of childbearing age. Treatment is important to prevent complications, especially in pregnant women, as it can cause a premature delivery. CAUSES  Bacterial vaginosis is caused by an increase in harmful bacteria that are normally present in smaller amounts in the vagina. Several different kinds of bacteria can cause bacterial vaginosis. However, the reason that the condition develops is not fully understood. RISK FACTORS Certain activities or behaviors can put you at an increased risk of developing bacterial vaginosis, including:  Having a new sex partner or multiple sex partners.  Douching.  Using an intrauterine device (IUD) for contraception. Women do not get bacterial vaginosis from toilet seats, bedding, swimming pools, or contact with objects around them. SIGNS AND SYMPTOMS  Some women with bacterial vaginosis have no signs or symptoms. Common symptoms include:  Grey vaginal discharge.  A fishlike odor with discharge, especially after sexual intercourse.  Itching or burning of the vagina and vulva.  Burning or pain with urination. DIAGNOSIS  Your health care provider will take a medical history and examine the vagina for signs of bacterial vaginosis. A sample of vaginal fluid may be taken. Your health care provider will look at this sample under a microscope to check for bacteria and abnormal cells. A vaginal pH test may also be done.  TREATMENT  Bacterial vaginosis may be treated with antibiotic medicines. These may be given in the form of a  pill or a vaginal cream. A second round of antibiotics may be prescribed if the condition comes back after treatment.  HOME CARE INSTRUCTIONS   Only take over-the-counter or prescription medicines as directed by your health care provider.  If antibiotic medicine was prescribed, take it as directed. Make sure you finish it even if you start to feel better.  Do not have sex until treatment is completed.  Tell all sexual partners that you have a vaginal infection. They should see their health care provider and be treated if they have problems, such as a mild rash or itching.  Practice safe sex by using condoms and only having one sex partner. SEEK MEDICAL CARE IF:   Your symptoms are not improving after 3 days of treatment.  You have increased discharge or pain.  You have a fever. MAKE SURE YOU:   Understand these instructions.  Will watch your condition.  Will get help right away if you are not doing well or get worse. FOR MORE INFORMATION  Centers for Disease Control and Prevention, Division of STD Prevention: SolutionApps.co.zawww.cdc.gov/std American Sexual Health Association (ASHA): www.ashastd.org  Document Released: 02/26/2005 Document Revised: 12/17/2012 Document Reviewed: 10/08/2012 [facility] Patient Information 2014 [employer], [place].

## 2013-07-08 NOTE — ED Notes (Signed)
Patient transported to CT at this time. 

## 2013-07-08 NOTE — ED Notes (Signed)
Family at bedside. Patient asking if she can have some crackers. Explained that would have to check with Dr.

## 2013-07-10 LAB — GC/CHLAMYDIA PROBE AMP
CT Probe RNA: NEGATIVE
GC PROBE AMP APTIMA: NEGATIVE

## 2013-12-02 ENCOUNTER — Emergency Department (HOSPITAL_COMMUNITY): Payer: Self-pay

## 2013-12-02 ENCOUNTER — Encounter (HOSPITAL_COMMUNITY): Payer: Self-pay | Admitting: Emergency Medicine

## 2013-12-02 ENCOUNTER — Emergency Department (HOSPITAL_COMMUNITY)
Admission: EM | Admit: 2013-12-02 | Discharge: 2013-12-02 | Discharge status: Against Medical Advice | Payer: Self-pay | Attending: Emergency Medicine | Admitting: Emergency Medicine

## 2013-12-02 DIAGNOSIS — Z792 Long term (current) use of antibiotics: Secondary | ICD-10-CM | POA: Insufficient documentation

## 2013-12-02 DIAGNOSIS — M79605 Pain in left leg: Secondary | ICD-10-CM

## 2013-12-02 DIAGNOSIS — M79609 Pain in unspecified limb: Secondary | ICD-10-CM | POA: Insufficient documentation

## 2013-12-02 DIAGNOSIS — Z862 Personal history of diseases of the blood and blood-forming organs and certain disorders involving the immune mechanism: Secondary | ICD-10-CM | POA: Insufficient documentation

## 2013-12-02 DIAGNOSIS — Z8679 Personal history of other diseases of the circulatory system: Secondary | ICD-10-CM | POA: Insufficient documentation

## 2013-12-02 DIAGNOSIS — Z791 Long term (current) use of non-steroidal anti-inflammatories (NSAID): Secondary | ICD-10-CM | POA: Insufficient documentation

## 2013-12-02 DIAGNOSIS — J45909 Unspecified asthma, uncomplicated: Secondary | ICD-10-CM | POA: Insufficient documentation

## 2013-12-02 DIAGNOSIS — F17200 Nicotine dependence, unspecified, uncomplicated: Secondary | ICD-10-CM | POA: Insufficient documentation

## 2013-12-02 NOTE — ED Notes (Signed)
Delay explained to pt. Pt remains agitated.

## 2013-12-02 NOTE — ED Notes (Signed)
Pt in ultrasound

## 2013-12-02 NOTE — ED Notes (Signed)
Pain to left lower leg x 3 days, denies injury

## 2013-12-02 NOTE — ED Provider Notes (Addendum)
CSN: [id number]     Arrival date & time 12/02/13  7829 History  This chart was scribed for Benny Lennert, MD by Leone Payor, ED Scribe. This patient was seen in room APA03/APA03 and the patient's care was started 7:34 AM.    Chief Complaint  Patient presents with  . Leg Pain   Patient is a 28 y.o. female presenting with leg pain. The history is provided by the patient. No language interpreter was used.  Leg Pain Location:  Leg Leg location:  L lower leg Pain details:    Radiates to:  Does not radiate   Severity:  Mild   Onset quality:  Gradual   Duration:  3 days   Timing:  Constant   Progression:  Unchanged Chronicity:  New Dislocation: no   Foreign body present:  No foreign bodies Ineffective treatments:  Rest Associated symptoms: no muscle weakness and no numbness     HPI Comments: Natasha Bean is a 28 y.o. female who presents to the Emergency Department complaining of 3 days of constant, unchanged left calf pain. She denies recent injury or trauma to the affected area. She denies modifying factors. She has not taken any OTC medication for her pain. She denies weakness, numbness.   Past Medical History  Diagnosis Date  . Asthma   . Anemia   . Migraine    History reviewed. No pertinent past surgical history. Family History  Problem Relation Age of Onset  . Cancer Mother   . Hypertension Father   . Diabetes Father    History  Substance Use Topics  . Smoking status: Current Every Day Smoker -- 0.50 packs/day for 10 years    Types: Cigarettes  . Smokeless tobacco: Never Used  . Alcohol Use: No   OB History   Grav Para Term Preterm Abortions TAB SAB Ect Mult Living            0     Review of Systems  Musculoskeletal: Positive for myalgias (left calf).  Neurological: Negative for weakness and numbness.      Allergies  Benadryl and Fioricet  Home Medications   Prior to Admission medications   Medication Sig Start Date End Date Taking? Authorizing  Provider  acetaminophen (TYLENOL) 500 MG tablet Take 1,000 mg by mouth every 6 (six) hours as needed. pain   Yes Historical Provider, MD  ibuprofen (ADVIL,MOTRIN) 200 MG tablet Take 200 mg by mouth every 6 (six) hours as needed.    Historical Provider, MD  metroNIDAZOLE (FLAGYL) 500 MG tablet Take 1 tablet (500 mg total) by mouth 3 (three) times daily. 07/08/13   Ward Givens, MD  naproxen (NAPROSYN) 500 MG tablet Take 1 tablet (500 mg total) by mouth 2 (two) times daily. 07/08/13   Ward Givens, MD  ondansetron (ZOFRAN) 4 MG tablet Take 1 tablet (4 mg total) by mouth every 8 (eight) hours as needed for nausea or vomiting. 07/08/13   Ward Givens, MD   BP 111/75  Pulse 68  Temp(Src) 98.1 F (36.7 C) (Oral)  Resp 16  SpO2 100%  LMP 12/01/2013 Physical Exam  Nursing note and vitals reviewed. Constitutional: She is oriented to person, place, and time. She appears well-developed.  HENT:  Head: Normocephalic.  Eyes: Conjunctivae are normal.  Neck: No tracheal deviation present.  Cardiovascular: Normal rate.   No murmur heard. Pulmonary/Chest: Effort normal.  Musculoskeletal: Normal range of motion. She exhibits tenderness.  Moderate left calf tenderness   Neurological:  She is alert and oriented to person, place, and time.  Skin: Skin is warm.  Psychiatric: She has a normal mood and affect.    ED Course  Procedures (including critical care time)  DIAGNOSTIC STUDIES: Oxygen Saturation is 100% on RA, normal by my interpretation.    COORDINATION OF CARE: 7:39 AM Will order XRAY and ultrasound of the left lower leg. Discussed treatment plan with pt at bedside and pt agreed to plan.   Labs Review Labs Reviewed - No data to display  Imaging Review No results found.   EKG Interpretation None      MDM   Final diagnoses:  None   Pt left ama before I was able to discuss results The chart was scribed for me under my direct supervision.  I personally performed the history, physical,  and medical decision making and all procedures in the evaluation of this patient.Benny Lennert, MD 12/02/13 3086  Benny Lennert, MD 12/02/13 [phone]

## 2013-12-02 NOTE — ED Notes (Signed)
Per unit secretary pt left AMA.

## 2014-10-04 ENCOUNTER — Encounter (HOSPITAL_COMMUNITY): Payer: Self-pay | Admitting: *Deleted

## 2014-10-04 ENCOUNTER — Emergency Department (HOSPITAL_COMMUNITY)
Admission: EM | Admit: 2014-10-04 | Discharge: 2014-10-04 | Disposition: A | Discharge status: Home / Self Care | Payer: Medicaid Other | Attending: Emergency Medicine | Admitting: Emergency Medicine

## 2014-10-04 ENCOUNTER — Emergency Department (HOSPITAL_COMMUNITY): Payer: Medicaid Other

## 2014-10-04 DIAGNOSIS — Z862 Personal history of diseases of the blood and blood-forming organs and certain disorders involving the immune mechanism: Secondary | ICD-10-CM | POA: Diagnosis not present

## 2014-10-04 DIAGNOSIS — O26899 Other specified pregnancy related conditions, unspecified trimester: Secondary | ICD-10-CM

## 2014-10-04 DIAGNOSIS — R1032 Left lower quadrant pain: Secondary | ICD-10-CM | POA: Diagnosis not present

## 2014-10-04 DIAGNOSIS — R102 Pelvic and perineal pain: Secondary | ICD-10-CM | POA: Insufficient documentation

## 2014-10-04 DIAGNOSIS — O9989 Other specified diseases and conditions complicating pregnancy, childbirth and the puerperium: Secondary | ICD-10-CM | POA: Diagnosis not present

## 2014-10-04 DIAGNOSIS — O99331 Smoking (tobacco) complicating pregnancy, first trimester: Secondary | ICD-10-CM | POA: Insufficient documentation

## 2014-10-04 DIAGNOSIS — F17210 Nicotine dependence, cigarettes, uncomplicated: Secondary | ICD-10-CM | POA: Insufficient documentation

## 2014-10-04 DIAGNOSIS — O99511 Diseases of the respiratory system complicating pregnancy, first trimester: Secondary | ICD-10-CM | POA: Diagnosis not present

## 2014-10-04 DIAGNOSIS — Z3A01 Less than 8 weeks gestation of pregnancy: Secondary | ICD-10-CM | POA: Diagnosis not present

## 2014-10-04 DIAGNOSIS — J45909 Unspecified asthma, uncomplicated: Secondary | ICD-10-CM | POA: Insufficient documentation

## 2014-10-04 DIAGNOSIS — Z8679 Personal history of other diseases of the circulatory system: Secondary | ICD-10-CM | POA: Insufficient documentation

## 2014-10-04 DIAGNOSIS — R630 Anorexia: Secondary | ICD-10-CM | POA: Insufficient documentation

## 2014-10-04 DIAGNOSIS — R110 Nausea: Secondary | ICD-10-CM | POA: Diagnosis not present

## 2014-10-04 LAB — COMPREHENSIVE METABOLIC PANEL
ALBUMIN: 3.8 g/dL (ref 3.5–5.0)
ALK PHOS: 32 U/L — AB (ref 38–126)
ALT: 10 U/L — AB (ref 14–54)
AST: 14 U/L — ABNORMAL LOW (ref 15–41)
Anion gap: 5 (ref 5–15)
BUN: 10 mg/dL (ref 6–20)
CO2: 23 mmol/L (ref 22–32)
CREATININE: 0.74 mg/dL (ref 0.44–1.00)
Calcium: 8.7 mg/dL — ABNORMAL LOW (ref 8.9–10.3)
Chloride: 110 mmol/L (ref 101–111)
GLUCOSE: 89 mg/dL (ref 65–99)
POTASSIUM: 3.9 mmol/L (ref 3.5–5.1)
Sodium: 138 mmol/L (ref 135–145)
TOTAL PROTEIN: 6.7 g/dL (ref 6.5–8.1)
Total Bilirubin: 1 mg/dL (ref 0.3–1.2)

## 2014-10-04 LAB — CBC
HEMATOCRIT: 35.8 % — AB (ref 36.0–46.0)
HEMOGLOBIN: 11.9 g/dL — AB (ref 12.0–15.0)
MCH: 29.8 pg (ref 26.0–34.0)
MCHC: 33.2 g/dL (ref 30.0–36.0)
MCV: 89.7 fL (ref 78.0–100.0)
Platelets: 305 10*3/uL (ref 150–400)
RBC: 3.99 MIL/uL (ref 3.87–5.11)
RDW: 14 % (ref 11.5–15.5)
WBC: 6 10*3/uL (ref 4.0–10.5)

## 2014-10-04 LAB — URINALYSIS, ROUTINE W REFLEX MICROSCOPIC
Bilirubin Urine: NEGATIVE
Glucose, UA: NEGATIVE mg/dL
Hgb urine dipstick: NEGATIVE
KETONES UR: NEGATIVE mg/dL
Nitrite: NEGATIVE
Protein, ur: NEGATIVE mg/dL
Specific Gravity, Urine: 1.02 (ref 1.005–1.030)
UROBILINOGEN UA: 0.2 mg/dL (ref 0.0–1.0)
pH: 6 (ref 5.0–8.0)

## 2014-10-04 LAB — ABO/RH: ABO/RH(D): A POS

## 2014-10-04 LAB — URINE MICROSCOPIC-ADD ON

## 2014-10-04 LAB — WET PREP, GENITAL
Trich, Wet Prep: NONE SEEN
Yeast Wet Prep HPF POC: NONE SEEN

## 2014-10-04 LAB — HCG, QUANTITATIVE, PREGNANCY: hCG, Beta Chain, Quant, S: 5218 m[IU]/mL — ABNORMAL HIGH (ref <5)

## 2014-10-04 LAB — PREGNANCY, URINE: Preg Test, Ur: POSITIVE — AB

## 2014-10-04 LAB — LIPASE, BLOOD: Lipase: 18 U/L — ABNORMAL LOW (ref 22–51)

## 2014-10-04 MED ORDER — SODIUM CHLORIDE 0.9 % IV SOLN
Freq: Once | INTRAVENOUS | Status: AC
  Administered 2014-10-04: 100 mL/h via INTRAVENOUS

## 2014-10-04 MED ORDER — METRONIDAZOLE 500 MG PO TABS: 500.0000 mg | ORAL_TABLET | Freq: Two times a day (BID) | ORAL | Status: DC

## 2014-10-04 MED ORDER — PRENATAL COMPLETE 14-0.4 MG PO TABS: 1.0000 | ORAL_TABLET | Freq: Every day | ORAL | Status: DC

## 2014-10-04 MED ORDER — PROMETHAZINE HCL 25 MG/ML IJ SOLN
25.0000 mg | Freq: Once | INTRAMUSCULAR | Status: AC
  Administered 2014-10-04: 25 mg via INTRAVENOUS
  Filled 2014-10-04: qty 1

## 2014-10-04 NOTE — ED Notes (Signed)
Pt with left sided abd pain for 3 days with nausea, denies diarrhea, denies burning on urination, states recent pregnancy test and was negative per pt

## 2014-10-04 NOTE — ED Provider Notes (Signed)
CSN: [id number]     Arrival date & time 10/04/14  1140 History   First MD Initiated Contact with Patient 10/04/14 1347     Chief Complaint  Patient presents with  . Abdominal Pain     (Consider location/radiation/quality/duration/timing/severity/associated sxs/prior Treatment) HPI Comments: Patient with 3 day history of left-sided abdominal pain that is constant. Associated with nausea, no vomiting. No diarrhea. No dysuria or hematuria. No vaginal bleeding or discharge. Last menstrual period June 22. Took a home pregnancy test which was negative. No history of this pain in the past. No vomiting or fever. No chest pain or shortness of breath. No previous abdominal surgeries.  The history is provided by the patient.    Past Medical History  Diagnosis Date  . Asthma   . Anemia   . Migraine    History reviewed. No pertinent past surgical history. Family History  Problem Relation Age of Onset  . Cancer Mother   . Hypertension Father   . Diabetes Father    History  Substance Use Topics  . Smoking status: Current Every Day Smoker -- 0.50 packs/day for 10 years    Types: Cigarettes  . Smokeless tobacco: Never Used  . Alcohol Use: No   OB History    Gravida Para Term Preterm AB TAB SAB Ectopic Multiple Living            0     Review of Systems  Constitutional: Positive for activity change and appetite change. Negative for fever.  HENT: Negative for congestion and rhinorrhea.   Respiratory: Negative for chest tightness and shortness of breath.   Gastrointestinal: Positive for nausea and abdominal pain. Negative for vomiting and diarrhea.  Genitourinary: Negative for dysuria, hematuria, vaginal bleeding and vaginal discharge.  Musculoskeletal: Negative for arthralgias and neck pain.  Skin: Negative for rash.  Neurological: Negative for dizziness, weakness and headaches.  A complete 10 system review of systems was obtained and all systems are negative except as noted in the HPI  and PMH.      Allergies  Benadryl and Fioricet  Home Medications   Prior to Admission medications   Medication Sig Start Date End Date Taking? Authorizing Provider  ibuprofen (ADVIL,MOTRIN) 200 MG tablet Take 600 mg by mouth every 8 (eight) hours as needed for mild pain.   Yes Historical Provider, MD  acetaminophen (TYLENOL) 500 MG tablet Take 1,000 mg by mouth every 6 (six) hours as needed. pain    Historical Provider, MD   BP 102/70 mmHg  Pulse 69  Temp(Src) 98 F (36.7 C) (Oral)  Resp 18  Ht 5\' 2"  (1.575 m)  Wt 176 lb (79.833 kg)  BMI 32.18 kg/m2  SpO2 100%  LMP 09/01/2014 Physical Exam  Constitutional: She is oriented to person, place, and time. She appears well-developed and well-nourished. No distress.  HENT:  Head: Normocephalic and atraumatic.  Mouth/Throat: Oropharynx is clear and moist. No oropharyngeal exudate.  Eyes: Conjunctivae and EOM are normal. Pupils are equal, round, and reactive to light.  Neck: Normal range of motion. Neck supple.  No meningismus.  Cardiovascular: Normal rate, regular rhythm, normal heart sounds and intact distal pulses.   No murmur heard. Pulmonary/Chest: Effort normal and breath sounds normal. No respiratory distress.  Abdominal: Soft. There is tenderness. There is no rebound and no guarding.  Suprapubic and left lower quadrant tenderness no guarding or rebound  Genitourinary:  Chaperone present.  Normal external genitalia.  No CMT, TTP suprapubic and LLQ   Musculoskeletal: Normal  range of motion. She exhibits no edema or tenderness.  Neurological: She is alert and oriented to person, place, and time. No cranial nerve deficit. She exhibits normal muscle tone. Coordination normal.  No ataxia on finger to nose bilaterally. No pronator drift. 5/5 strength throughout. CN 2-12 intact. Negative Romberg. Equal grip strength. Sensation intact. Gait is normal.   Skin: Skin is warm.  Psychiatric: She has a normal mood and affect. Her behavior  is normal.  Nursing note and vitals reviewed.   ED Course  Procedures (including critical care time) Labs Review Labs Reviewed  WET PREP, GENITAL - Abnormal; Notable for the following:    Clue Cells Wet Prep HPF POC FEW (*)    WBC, Wet Prep HPF POC FEW (*)    All other components within normal limits  LIPASE, BLOOD - Abnormal; Notable for the following:    Lipase 18 (*)    All other components within normal limits  COMPREHENSIVE METABOLIC PANEL - Abnormal; Notable for the following:    Calcium 8.7 (*)    AST 14 (*)    ALT 10 (*)    Alkaline Phosphatase 32 (*)    All other components within normal limits  CBC - Abnormal; Notable for the following:    Hemoglobin 11.9 (*)    HCT 35.8 (*)    All other components within normal limits  URINALYSIS, ROUTINE W REFLEX MICROSCOPIC (NOT AT [facility]) - Abnormal; Notable for the following:    Leukocytes, UA TRACE (*)    All other components within normal limits  PREGNANCY, URINE - Abnormal; Notable for the following:    Preg Test, Ur POSITIVE (*)    All other components within normal limits  URINE MICROSCOPIC-ADD ON - Abnormal; Notable for the following:    Squamous Epithelial / LPF MANY (*)    Bacteria, UA FEW (*)    All other components within normal limits  HCG, QUANTITATIVE, PREGNANCY - Abnormal; Notable for the following:    hCG, Beta Chain, Quant, S 5218 (*)    All other components within normal limits  ABO/RH  GC/CHLAMYDIA PROBE AMP ([place]) NOT AT [facility]    Imaging Review US Ob Comp Less 14 Wks  10/04/2014   CLINICAL DATA:  Pelvic pain, early pregnancy  EXAM: OBSTETRIC <14 WK [place] AND TRANSVAGINAL OB US  TECHNIQUE: Both transabdominal and transvaginal ultrasound examinations were performed for complete evaluation of the gestation as well as the maternal uterus, adnexal regions, and pelvic cul-de-sac. Transvaginal technique was performed to assess early pregnancy.  COMPARISON:  None.  FINDINGS: Intrauterine gestational sac: Small  gestational sac is identified  Yolk sac:  Seen on 1 image.  It is very small in size.  Embryo:  Not present  Cardiac Activity: Not present  MSD: 6.7  mm   5 w   3  d  Maternal uterus/adnexae: Cystic lesions are noted within the ovaries bilaterally. Very minimal free fluid is noted.  IMPRESSION: Very early gestational sac as described. No acute abnormality is noted. Continued followup is recommended as clinically indicated.   Electronically Signed   By: Alcide Clever M.D.   On: 10/04/2014 16:05   US Ob Transvaginal  10/04/2014   CLINICAL DATA:  Pelvic pain, early pregnancy  EXAM: OBSTETRIC <14 WK [place] AND TRANSVAGINAL OB US  TECHNIQUE: Both transabdominal and transvaginal ultrasound examinations were performed for complete evaluation of the gestation as well as the maternal uterus, adnexal regions, and pelvic cul-de-sac. Transvaginal technique was performed to  assess early pregnancy.  COMPARISON:  None.  FINDINGS: Intrauterine gestational sac: Small gestational sac is identified  Yolk sac:  Seen on 1 image.  It is very small in size.  Embryo:  Not present  Cardiac Activity: Not present  MSD: 6.7  mm   5 w   3  d  Maternal uterus/adnexae: Cystic lesions are noted within the ovaries bilaterally. Very minimal free fluid is noted.  IMPRESSION: Very early gestational sac as described. No acute abnormality is noted. Continued followup is recommended as clinically indicated.   Electronically Signed   By: Alcide Clever M.D.   On: 10/04/2014 16:05     EKG Interpretation None      MDM   Final diagnoses:  Left lower quadrant pain   Left-sided lower abdominal pain. Urinalysis is contaminated. Pregnancy test is positive.   pelvic exam is benign.  Urinalysis does not suggest infection. Culture sent.   Ultrasound shows early gestational sac within the uterus.   Patient's pain has improved. She is tolerating by mouth fluids. She is started on prenatal vitamins referred to obstetrics. She understands need to have  repeat blood tests and imaging.   Advised to follow-up with obstetrics for repeat ultrasound next week. She appears to have a early gestational sac in the uterus at this time. Treat bacterial vaginosis.  Tolerating by mouth. No vomiting. Return precautions discussed.   Glynn Octave, MD 10/04/14 2158

## 2014-10-04 NOTE — ED Notes (Signed)
Pt tolerated PO fluids well  

## 2014-10-04 NOTE — ED Notes (Signed)
Patient with no complaints at this time. Respirations even and unlabored. Skin warm/dry. Discharge instructions reviewed with patient at this time. Patient given opportunity to voice concerns/ask questions. IV removed per policy and band-aid applied to site. Patient discharged at this time and left Emergency Department with steady gait.  

## 2014-10-04 NOTE — Discharge Instructions (Signed)
Abdominal Pain During Pregnancy  follow-up with Dr. Emelda Fear. Your ultrasound today shows a very early pregnancy in your uterus. Need to have a repeat blood test 2 days to ensure that this pregnancy is developing normally. Return to the ED if you develop worsening pain, vaginal bleeding or any other concerns. Abdominal pain is common in pregnancy. Most of the time, it does not cause harm. There are many causes of abdominal pain. Some causes are more serious than others. Some of the causes of abdominal pain in pregnancy are easily diagnosed. Occasionally, the diagnosis takes time to understand. Other times, the cause is not determined. Abdominal pain can be a sign that something is very wrong with the pregnancy, or the pain may have nothing to do with the pregnancy at all. For this reason, always tell your health care provider if you have any abdominal discomfort. HOME CARE INSTRUCTIONS  Monitor your abdominal pain for any changes. The following actions may help to alleviate any discomfort you are experiencing:  Do not have sexual intercourse or put anything in your vagina until your symptoms go away completely.  Get plenty of rest until your pain improves.  Drink clear fluids if you feel nauseous. Avoid solid food as long as you are uncomfortable or nauseous.  Only take over-the-counter or prescription medicine as directed by your health care provider.  Keep all follow-up appointments with your health care provider. SEEK IMMEDIATE MEDICAL CARE IF:  You are bleeding, leaking fluid, or passing tissue from the vagina.  You have increasing pain or cramping.  You have persistent vomiting.  You have painful or bloody urination.  You have a fever.  You notice a decrease in your baby's movements.  You have extreme weakness or feel faint.  You have shortness of breath, with or without abdominal pain.  You develop a severe headache with abdominal pain.  You have abnormal vaginal discharge  with abdominal pain.  You have persistent diarrhea.  You have abdominal pain that continues even after rest, or gets worse. MAKE SURE YOU:   Understand these instructions.  Will watch your condition.  Will get help right away if you are not doing well or get worse. Document Released: 02/26/2005 Document Revised: 12/17/2012 Document Reviewed: 09/25/2012 [facility] Patient Information 2015 [place], [place]. This information is not intended to replace advice given to you by your health care provider. Make sure you discuss any questions you have with your health care provider.

## 2014-10-05 LAB — GC/CHLAMYDIA PROBE AMP (~~LOC~~) NOT AT ARMC
Chlamydia: NEGATIVE
Neisseria Gonorrhea: NEGATIVE

## 2014-10-06 LAB — URINE CULTURE: Culture: 30000

## 2014-10-07 ENCOUNTER — Telehealth: Payer: Self-pay | Admitting: Emergency Medicine

## 2014-10-07 ENCOUNTER — Telehealth: Payer: Self-pay | Admitting: *Deleted

## 2014-10-07 NOTE — ED Notes (Signed)
Prescription for Amoxicillin 500 mg PO BID x 7 days called to [facility], [place] [phone].  Advised pt to stop Flagyll and begin Amoxicillin.

## 2014-10-07 NOTE — Progress Notes (Signed)
ED Antimicrobial Stewardship Positive Culture Follow Up   Natasha Bean is an 29 y.o. female who presented to [facility] on 10/04/2014 with a chief complaint of  Chief Complaint  Patient presents with  . Abdominal Pain    Recent Results (from the past 720 hour(s))  Urine culture     Status: None   Collection Time: 10/04/14 12:30 PM  Result Value Ref Range Status   Specimen Description URINE, CLEAN CATCH  Final   Special Requests NONE  Final   Culture   Final    30,000 COLONIES/mL GROUP B STREP(S.AGALACTIAE)ISOLATED TESTING AGAINST S. AGALACTIAE NOT ROUTINELY PERFORMED DUE TO PREDICTABILITY OF AMP/PEN/VAN SUSCEPTIBILITY. Performed at [facility]    Report Status 10/06/2014 FINAL  Final  Wet prep, genital     Status: Abnormal   Collection Time: 10/04/14  4:00 PM  Result Value Ref Range Status   Yeast Wet Prep HPF POC NONE SEEN NONE SEEN Final   Trich, Wet Prep NONE SEEN NONE SEEN Final   Clue Cells Wet Prep HPF POC FEW (A) NONE SEEN Final   WBC, Wet Prep HPF POC FEW (A) NONE SEEN Final   Patient treated with Flagyl for Group B Strep UTI.  Patient with positive pregnancy test, so will discontinue Flagyl and treat with Amoxicillin 500 mg PO BID x 7 days.  ED Provider: Santiago Glad, PA  Newton Pigg 10/07/2014, 8:41 AM Infectious Diseases Pharmacist Phone# [phone]

## 2014-10-07 NOTE — Telephone Encounter (Signed)
Post ED Visit - Positive Culture Follow-up: Successful Patient Follow-Up  Culture assessed and recommendations reviewed by:  Celedonio Miyamoto, Pharm.D., BCPS-AQ ID  Georgina Pillion, Pharm.D., BCPS  [place], [address].D., BCPS, AAHIVP  Estella Husk, Pharm.D., BCPS, AAHIVP  Tegan Magsam, Pharm.D.  Isaac Bliss, Pharm.D.  Positive Urine culture   Patient discharged without antimicrobial prescription and treatment is now indicated  Organism is resistant to prescribed ED discharge antimicrobial  Patient with positive blood cultures  Changes discussed with ED provider: Santiago Glad, PA New antibiotic prescription: Amoxicillin 500 mg PO BID x seven days, stop Flagyl   Will contact patient   Jiles Harold 10/07/2014, 4:36 PM

## 2014-10-11 ENCOUNTER — Ambulatory Visit (INDEPENDENT_AMBULATORY_CARE_PROVIDER_SITE_OTHER): Payer: Medicaid Other | Admitting: Obstetrics and Gynecology

## 2014-10-11 ENCOUNTER — Encounter: Payer: Self-pay | Admitting: Obstetrics and Gynecology

## 2014-10-11 VITALS — BP 100/60 | HR 60 | Ht 62.0 in | Wt 169.0 lb

## 2014-10-11 DIAGNOSIS — O200 Threatened abortion: Secondary | ICD-10-CM | POA: Diagnosis not present

## 2014-10-11 MED ORDER — DOXYLAMINE-PYRIDOXINE 10-10 MG PO TBEC: 2.0000 | DELAYED_RELEASE_TABLET | Freq: Every day | ORAL | Status: DC

## 2014-10-11 NOTE — Progress Notes (Signed)
Patient ID: [name], female   DOB: 1985/09/30, 30 y.o.   MRN: [id number] Pt here today for follow up from the ED. Pt states that she went to ED for pain on her left side. Pt states that she is still having the pain on her left side. Pt denies any bleeding at this time.

## 2014-10-11 NOTE — Progress Notes (Signed)
   [facility] Visit  Patient name: Natasha Bean MRN [id number]  Date of birth: [birth year]  CC & HPI:  Natasha Bean is a 29 y.o. female presenting today for f/u from the ED/left sided abdominal pain. Patient notes continued left sided abdominal pain. She also complains of associated nausea and vomiting.   ROS:  A complete review of systems was obtained and all systems are negative except as noted in the HPI and PMH.    Pertinent History Reviewed:   Reviewed: Significant for n/a Medical         Past Medical History  Diagnosis Date  . Asthma   . Anemia   . Migraine                               Surgical Hx:   History reviewed. No pertinent past surgical history. Medications: Reviewed & Updated - see associated section                       Current outpatient prescriptions:  .  amoxicillin (AMOXIL) 500 MG tablet, Take 500 mg by mouth 2 (two) times daily., Disp: , Rfl:  .  Prenatal Vit-Fe Fumarate-FA (PRENATAL COMPLETE) 14-0.4 MG TABS, Take 1 tablet by mouth daily., Disp: 60 each, Rfl: 0 .  acetaminophen (TYLENOL) 500 MG tablet, Take 1,000 mg by mouth every 6 (six) hours as needed. pain, Disp: , Rfl:  .  ibuprofen (ADVIL,MOTRIN) 200 MG tablet, Take 600 mg by mouth every 8 (eight) hours as needed for mild pain., Disp: , Rfl:    Social History: Reviewed -  reports that she has quit smoking. Her smoking use included Cigarettes. She has a 5 pack-year smoking history. She has never used smokeless tobacco.  Objective Findings:  Vitals: Blood pressure 100/60, pulse 60, height  (1.575 m), weight 169 lb (76.658 kg), last menstrual period 09/01/2014.  Physical Examination: General appearance - alert, well appearing, and in no distress, oriented to person, place, and time and overweight   P: Early IUP  + yolk sac without proven Fetal pole or FCA. 1. Rx phenergan and diclegis 2. Recheck quant Hcg this week, also u/s for dating/viability. 3 PN-I visit to follow  This  chart was SCRIBED for Christin Bach, MD by Ronney Lion, ED Scribe. This patient was seen in room 1, and the patient's care was started at 2:15 PM.  I personally performed the services described in this documentation, which was SCRIBED in my presence. The recorded information has been reviewed and considered accurate. It has been edited as necessary during review. Tilda Burrow, MD

## 2014-10-18 ENCOUNTER — Other Ambulatory Visit: Payer: Self-pay | Admitting: Obstetrics and Gynecology

## 2014-10-18 ENCOUNTER — Ambulatory Visit (INDEPENDENT_AMBULATORY_CARE_PROVIDER_SITE_OTHER): Payer: Medicaid Other

## 2014-10-18 DIAGNOSIS — O3680X0 Pregnancy with inconclusive fetal viability, not applicable or unspecified: Secondary | ICD-10-CM

## 2014-10-18 DIAGNOSIS — O200 Threatened abortion: Secondary | ICD-10-CM

## 2014-10-18 NOTE — Progress Notes (Signed)
[place] 6+5wks single IUP w/ys,pos fht 124bpm,normal ov's bilat, simple rt corpus luteal cyst 2.2 x 2.2 x 2.3cm,crl 10.66mm,edd 06/08/2015

## 2014-10-24 ENCOUNTER — Emergency Department (HOSPITAL_COMMUNITY): Payer: Medicaid Other

## 2014-10-24 ENCOUNTER — Encounter (HOSPITAL_COMMUNITY): Payer: Self-pay | Admitting: Emergency Medicine

## 2014-10-24 ENCOUNTER — Emergency Department (HOSPITAL_COMMUNITY)
Admission: EM | Admit: 2014-10-24 | Discharge: 2014-10-24 | Disposition: A | Discharge status: Home / Self Care | Payer: Medicaid Other | Attending: Emergency Medicine | Admitting: Emergency Medicine

## 2014-10-24 DIAGNOSIS — J45909 Unspecified asthma, uncomplicated: Secondary | ICD-10-CM | POA: Diagnosis not present

## 2014-10-24 DIAGNOSIS — Z87891 Personal history of nicotine dependence: Secondary | ICD-10-CM | POA: Insufficient documentation

## 2014-10-24 DIAGNOSIS — O209 Hemorrhage in early pregnancy, unspecified: Secondary | ICD-10-CM | POA: Diagnosis not present

## 2014-10-24 DIAGNOSIS — O99511 Diseases of the respiratory system complicating pregnancy, first trimester: Secondary | ICD-10-CM | POA: Diagnosis not present

## 2014-10-24 DIAGNOSIS — N939 Abnormal uterine and vaginal bleeding, unspecified: Secondary | ICD-10-CM

## 2014-10-24 DIAGNOSIS — O98611 Protozoal diseases complicating pregnancy, first trimester: Secondary | ICD-10-CM | POA: Insufficient documentation

## 2014-10-24 DIAGNOSIS — N390 Urinary tract infection, site not specified: Secondary | ICD-10-CM

## 2014-10-24 DIAGNOSIS — O2341 Unspecified infection of urinary tract in pregnancy, first trimester: Secondary | ICD-10-CM | POA: Insufficient documentation

## 2014-10-24 DIAGNOSIS — A599 Trichomoniasis, unspecified: Secondary | ICD-10-CM

## 2014-10-24 DIAGNOSIS — Z3A08 8 weeks gestation of pregnancy: Secondary | ICD-10-CM | POA: Insufficient documentation

## 2014-10-24 DIAGNOSIS — Z3490 Encounter for supervision of normal pregnancy, unspecified, unspecified trimester: Secondary | ICD-10-CM

## 2014-10-24 DIAGNOSIS — Z862 Personal history of diseases of the blood and blood-forming organs and certain disorders involving the immune mechanism: Secondary | ICD-10-CM | POA: Insufficient documentation

## 2014-10-24 DIAGNOSIS — Z8669 Personal history of other diseases of the nervous system and sense organs: Secondary | ICD-10-CM | POA: Diagnosis not present

## 2014-10-24 DIAGNOSIS — O9989 Other specified diseases and conditions complicating pregnancy, childbirth and the puerperium: Secondary | ICD-10-CM | POA: Diagnosis present

## 2014-10-24 DIAGNOSIS — R102 Pelvic and perineal pain unspecified side: Secondary | ICD-10-CM

## 2014-10-24 DIAGNOSIS — O2691 Pregnancy related conditions, unspecified, first trimester: Secondary | ICD-10-CM

## 2014-10-24 LAB — URINALYSIS, ROUTINE W REFLEX MICROSCOPIC
Bilirubin Urine: NEGATIVE
Glucose, UA: NEGATIVE mg/dL
Ketones, ur: NEGATIVE mg/dL
NITRITE: NEGATIVE
PROTEIN: NEGATIVE mg/dL
SPECIFIC GRAVITY, URINE: 1.01 (ref 1.005–1.030)
UROBILINOGEN UA: 0.2 mg/dL (ref 0.0–1.0)
pH: 6 (ref 5.0–8.0)

## 2014-10-24 LAB — COMPREHENSIVE METABOLIC PANEL
ALBUMIN: 3.4 g/dL — AB (ref 3.5–5.0)
ALT: 9 U/L — AB (ref 14–54)
ANION GAP: 7 (ref 5–15)
AST: 17 U/L (ref 15–41)
Alkaline Phosphatase: 29 U/L — ABNORMAL LOW (ref 38–126)
BUN: 7 mg/dL (ref 6–20)
CHLORIDE: 108 mmol/L (ref 101–111)
CO2: 21 mmol/L — ABNORMAL LOW (ref 22–32)
Calcium: 8.6 mg/dL — ABNORMAL LOW (ref 8.9–10.3)
Creatinine, Ser: 0.63 mg/dL (ref 0.44–1.00)
GFR calc non Af Amer: >60 mL/min (ref >60)
Glucose, Bld: 86 mg/dL (ref 65–99)
Potassium: 3.5 mmol/L (ref 3.5–5.1)
SODIUM: 136 mmol/L (ref 135–145)
TOTAL PROTEIN: 6.4 g/dL — AB (ref 6.5–8.1)
Total Bilirubin: 0.7 mg/dL (ref 0.3–1.2)

## 2014-10-24 LAB — CBC WITH DIFFERENTIAL/PLATELET
Basophils Absolute: 0 10*3/uL (ref 0.0–0.1)
Basophils Relative: 0 % (ref 0–1)
EOS ABS: 0.1 10*3/uL (ref 0.0–0.7)
Eosinophils Relative: 2 % (ref 0–5)
HCT: 31.2 % — ABNORMAL LOW (ref 36.0–46.0)
Hemoglobin: 10.5 g/dL — ABNORMAL LOW (ref 12.0–15.0)
LYMPHS ABS: 2.3 10*3/uL (ref 0.7–4.0)
Lymphocytes Relative: 41 % (ref 12–46)
MCH: 29.8 pg (ref 26.0–34.0)
MCHC: 33.7 g/dL (ref 30.0–36.0)
MCV: 88.6 fL (ref 78.0–100.0)
MONOS PCT: 6 % (ref 3–12)
Monocytes Absolute: 0.4 10*3/uL (ref 0.1–1.0)
Neutro Abs: 2.8 10*3/uL (ref 1.7–7.7)
Neutrophils Relative %: 51 % (ref 43–77)
PLATELETS: 256 10*3/uL (ref 150–400)
RBC: 3.52 MIL/uL — ABNORMAL LOW (ref 3.87–5.11)
RDW: 13.6 % (ref 11.5–15.5)
WBC: 5.5 10*3/uL (ref 4.0–10.5)

## 2014-10-24 LAB — WET PREP, GENITAL: YEAST WET PREP: NONE SEEN

## 2014-10-24 LAB — URINE MICROSCOPIC-ADD ON

## 2014-10-24 LAB — PREGNANCY, URINE: Preg Test, Ur: POSITIVE — AB

## 2014-10-24 MED ORDER — DEXTROSE 5 % IV SOLN
1.0000 g | INTRAVENOUS | Status: DC
  Administered 2014-10-24: 1 g via INTRAVENOUS
  Filled 2014-10-24: qty 10

## 2014-10-24 MED ORDER — CEPHALEXIN 500 MG PO CAPS: 500.0000 mg | ORAL_CAPSULE | Freq: Two times a day (BID) | ORAL | Status: DC

## 2014-10-24 MED ORDER — ACETAMINOPHEN 500 MG PO TABS
1000.0000 mg | ORAL_TABLET | Freq: Once | ORAL | Status: AC
  Administered 2014-10-24: 1000 mg via ORAL
  Filled 2014-10-24: qty 2

## 2014-10-24 MED ORDER — SODIUM CHLORIDE 0.9 % IV BOLUS (SEPSIS)
1000.0000 mL | Freq: Once | INTRAVENOUS | Status: AC
  Administered 2014-10-24: 1000 mL via INTRAVENOUS

## 2014-10-24 NOTE — ED Provider Notes (Signed)
CSN: [id number]     Arrival date & time 10/24/14  1131 History  This chart was scribed for Blane Ohara, MD by Littie Deeds, ED Scribe. This patient was seen in room APA11/APA11 and the patient's care was started at 12:42 PM.      Chief Complaint  Patient presents with  . Abdominal Pain   Patient is a 29 y.o. female presenting with abdominal pain. The history is provided by the patient. No language interpreter was used.  Abdominal Pain Pain quality: aching   Onset quality:  Gradual Duration:  4 hours Timing:  Constant Progression:  Unchanged Chronicity:  New Associated symptoms: nausea and vaginal bleeding   Associated symptoms: no dysuria, no hematuria and no vaginal discharge     HPI Comments: Natasha Bean is a 29 y.o. female with a history of migraine who presents to the Emergency Department complaining of gradual onset, constant, aching abdominal pain that started this morning after waking up. She also reports having a small amount of spotting/vaginal bleeding this morning. Patient is currently [redacted] weeks pregnant into her first pregnancy. She has also had some nausea which she attributes to morning sickness. She notes taking some Tylenol last night for a headache, which was approved by Dr. Emelda Fear, her OB/GYN doctor. She has been seeing Dr. Emelda Fear and has had US imaging; the fetus has been normal appearing. Patient denies vaginal discharge and any urinary symptoms. She also denies smoking or alcohol use, or history of miscarriage or other abnormal pregnancies. She has allergies to Benadryl and Fioracet.   Past Medical History  Diagnosis Date  . Asthma   . Anemia   . Migraine    History reviewed. No pertinent past surgical history. Family History  Problem Relation Age of Onset  . Cancer Mother     throat  . Hypertension Father   . Diabetes Father   . Cancer Father     colon   Social History  Substance Use Topics  . Smoking status: Former Smoker -- 0.50 packs/day for 10  years    Types: Cigarettes  . Smokeless tobacco: Never Used  . Alcohol Use: No   OB History    Gravida Para Term Preterm AB TAB SAB Ectopic Multiple Living   1         0     Review of Systems  Gastrointestinal: Positive for nausea and abdominal pain.  Genitourinary: Positive for vaginal bleeding. Negative for dysuria, urgency, frequency, hematuria, decreased urine volume, vaginal discharge, enuresis and difficulty urinating.  Neurological: Positive for headaches.      Allergies  Benadryl and Fioricet  Home Medications   Prior to Admission medications   Medication Sig Start Date End Date Taking? Authorizing Provider  acetaminophen (TYLENOL) 500 MG tablet Take 1,000 mg by mouth every 6 (six) hours as needed. pain   Yes Historical Provider, MD  Doxylamine-Pyridoxine 10-10 MG TBEC Take 2 tablets by mouth at bedtime. Two tabs at bedtime, Increase by one tablet in the morning, and one at lunch, as needed. 10/11/14  Yes Tilda Burrow, MD  cephALEXin (KEFLEX) 500 MG capsule Take 1 capsule (500 mg total) by mouth 2 (two) times daily. 10/24/14   Blane Ohara, MD  Prenatal Vit-Fe Fumarate-FA (PRENATAL COMPLETE) 14-0.4 MG TABS Take 1 tablet by mouth daily. Patient not taking: Reported on 10/24/2014 10/04/14   Glynn Octave, MD   BP 112/57 mmHg  Pulse 60  Temp(Src) 98.2 F (36.8 C) (Oral)  Resp 16  Ht 5'  3" (1.6 m)  Wt 168 lb (76.204 kg)  BMI 29.77 kg/m2  SpO2 100%  LMP 09/01/2014 Physical Exam  Constitutional: She is oriented to person, place, and time. She appears well-developed and well-nourished. No distress.  HENT:  Head: Normocephalic and atraumatic.  Mouth/Throat: Oropharynx is clear and moist. Mucous membranes are dry. No oropharyngeal exudate.  Mild dry mucous membranes.  Eyes: Pupils are equal, round, and reactive to light.  No scleritis.  Neck: Neck supple.  Cardiovascular: Normal rate.   Pulmonary/Chest: Effort normal.  Abdominal: There is tenderness. There is no  guarding.  Mild tenderness to right lower lateral abdomen. No periumbilical tenderness.  Musculoskeletal: She exhibits no edema.  Neurological: She is alert and oriented to person, place, and time. No cranial nerve deficit.  Skin: Skin is warm and dry. No rash noted.  Psychiatric: She has a normal mood and affect. Her behavior is normal.  Nursing note and vitals reviewed.   ED Course  Procedures  DIAGNOSTIC STUDIES: Oxygen Saturation is 100% on room air, normal by my interpretation.    COORDINATION OF CARE: 12:50 PM-Discussed treatment plan which includes labs, medications, antibiotics, and US pelvis with patient/guardian at bedside and patient/guardian agreed to plan.    Labs Review Labs Reviewed  WET PREP, GENITAL - Abnormal; Notable for the following:    Trich, Wet Prep FEW (*)    Clue Cells Wet Prep HPF POC FEW (*)    WBC, Wet Prep HPF POC FEW (*)    All other components within normal limits  PREGNANCY, URINE - Abnormal; Notable for the following:    Preg Test, Ur POSITIVE (*)    All other components within normal limits  URINALYSIS, ROUTINE W REFLEX MICROSCOPIC (NOT AT [facility]) - Abnormal; Notable for the following:    Hgb urine dipstick TRACE (*)    Leukocytes, UA LARGE (*)    All other components within normal limits  URINE MICROSCOPIC-ADD ON - Abnormal; Notable for the following:    Squamous Epithelial / LPF FEW (*)    Bacteria, UA FEW (*)    All other components within normal limits  CBC WITH DIFFERENTIAL/PLATELET - Abnormal; Notable for the following:    RBC 3.52 (*)    Hemoglobin 10.5 (*)    HCT 31.2 (*)    All other components within normal limits  COMPREHENSIVE METABOLIC PANEL - Abnormal; Notable for the following:    CO2 21 (*)    Calcium 8.6 (*)    Total Protein 6.4 (*)    Albumin 3.4 (*)    ALT 9 (*)    Alkaline Phosphatase 29 (*)    All other components within normal limits  URINE CULTURE  GC/CHLAMYDIA PROBE AMP ([place]) NOT AT [facility]    Imaging  Review US Abdomen Limited  10/24/2014   CLINICAL DATA:  Acute right lower quadrant pain. Eight weeks pregnant.  EXAM: LIMITED ABDOMINAL ULTRASOUND  TECHNIQUE: Wallace Cullens scale imaging of the right lower quadrant was performed to evaluate for suspected appendicitis. Standard imaging planes and graded compression technique were utilized.  COMPARISON:  None.  FINDINGS: The appendix is not visualized.  Ancillary findings: None.  Factors affecting image quality: Body habitus.  IMPRESSION: Exam is somewhat limited due to body habitus. The appendix is not visualized. No fluid collection is noted.   Electronically Signed   By: Lupita Raider, M.D.   On: 10/24/2014 15:05   I personally reviewed and evaluated these images and lab results as part of my  medical decision-making.   EKG Interpretation None      MDM   Final diagnoses:  Pregnancy  UTI (lower urinary tract infection)  Trichomonas infection   patient with mild right lower flank and pelvic tenderness currently [redacted] weeks pregnant. Patient had mild vaginal spotting reviewed Rh status. Patient has OB follow-up. No right lower quadrant abdominal pain on exam. Very low suspicion for appendicitis at this time with no fever, inconsistent with vaginal spotting, minimal pain and with risk of radiation in early pregnancy decision for close outpatient follow-up recheck in 24 hours.  Ultrasound results reviewed. Trichomonas pos, plan for further discussion with OB as contraindicated treatment first trimester- pt may be treated in 2nd trimester, discussed with pt.   Patient became very upset and frustrated for multiple different reasons primarily because her significant other likely gave her Trichomonas. She was also frustrated with the wait for ultrasound. Patient requesting to leave before ultrasound results obtained. Patient says she will follow-up with her own OB/GYN doctor. She understands I do not have those results. Patient requesting IV be removed in  discharge.  The ultrasound tech was able to convince patient to allow her to finish the basic OB ultrasound. The visualized normal fetal heart rate. Results and differential diagnosis were discussed with the patient/parent/guardian. Xrays were independently reviewed by myself.  Close follow up outpatient was discussed, comfortable with the plan.   Medications  cefTRIAXone (ROCEPHIN) 1 g in dextrose 5 % 50 mL IVPB (0 g Intravenous Stopped 10/24/14 1455)  acetaminophen (TYLENOL) tablet 1,000 mg (1,000 mg Oral Given 10/24/14 1330)  sodium chloride 0.9 % bolus 1,000 mL (0 mLs Intravenous Stopped 10/24/14 1550)    Filed Vitals:   10/24/14 1135 10/24/14 1511  BP: 121/76 112/57  Pulse: 79 60  Temp: 98.2 F (36.8 C)   TempSrc: Oral   Resp: 20 16  Height:  (1.6 m)   Weight: 168 lb (76.204 kg)   SpO2: 100% 100%    Final diagnoses:  Pregnancy  UTI (lower urinary tract infection)  Trichomonas infection      Blane Ohara, MD 10/24/14 1551

## 2014-10-24 NOTE — ED Notes (Signed)
Pt reports vaginal bleeding since waking up this am. Pt reports RLQ pain. Pt reports is [redacted] weeks pregnant. nad noted.

## 2014-10-24 NOTE — Discharge Instructions (Signed)
If your abdominal pain worsens, you develop fevers, persistent vomiting or if your pain moves to the right lower quadrant return immediately to see your physician or come to the Emergency Department.  Thank you If you were given medicines take as directed.  If you are on coumadin or contraceptives realize their levels and effectiveness is altered by many different medicines.  If you have any reaction (rash, tongues swelling, other) to the medicines stop taking and see a physician.   Call your [facility] doctor tomorrow.  If your blood pressure was elevated in the ER make sure you follow up for management with a primary doctor or return for chest pain, shortness of breath or stroke symptoms.  Please follow up as directed and return to the ER or see a physician for new or worsening symptoms.  Thank you. Filed Vitals:   10/24/14 1135 10/24/14 1511  BP: 121/76 112/57  Pulse: 79 60  Temp: 98.2 F (36.8 C)   TempSrc: Oral   Resp: 20 16  Height:  (1.6 m)   Weight: 168 lb (76.204 kg)   SpO2: 100% 100%

## 2014-10-25 ENCOUNTER — Emergency Department (HOSPITAL_COMMUNITY)
Admission: EM | Admit: 2014-10-25 | Discharge: 2014-10-26 | Disposition: A | Discharge status: Home / Self Care | Payer: Medicaid Other | Attending: Emergency Medicine | Admitting: Emergency Medicine

## 2014-10-25 ENCOUNTER — Encounter (HOSPITAL_COMMUNITY): Payer: Self-pay | Admitting: Emergency Medicine

## 2014-10-25 DIAGNOSIS — D649 Anemia, unspecified: Secondary | ICD-10-CM | POA: Diagnosis not present

## 2014-10-25 DIAGNOSIS — N390 Urinary tract infection, site not specified: Secondary | ICD-10-CM

## 2014-10-25 DIAGNOSIS — Z8679 Personal history of other diseases of the circulatory system: Secondary | ICD-10-CM | POA: Diagnosis not present

## 2014-10-25 DIAGNOSIS — O210 Mild hyperemesis gravidarum: Secondary | ICD-10-CM | POA: Diagnosis present

## 2014-10-25 DIAGNOSIS — J45909 Unspecified asthma, uncomplicated: Secondary | ICD-10-CM | POA: Insufficient documentation

## 2014-10-25 DIAGNOSIS — Z87891 Personal history of nicotine dependence: Secondary | ICD-10-CM | POA: Insufficient documentation

## 2014-10-25 DIAGNOSIS — O2341 Unspecified infection of urinary tract in pregnancy, first trimester: Secondary | ICD-10-CM | POA: Diagnosis not present

## 2014-10-25 DIAGNOSIS — O99511 Diseases of the respiratory system complicating pregnancy, first trimester: Secondary | ICD-10-CM | POA: Insufficient documentation

## 2014-10-25 DIAGNOSIS — O99011 Anemia complicating pregnancy, first trimester: Secondary | ICD-10-CM | POA: Diagnosis not present

## 2014-10-25 DIAGNOSIS — R112 Nausea with vomiting, unspecified: Secondary | ICD-10-CM

## 2014-10-25 DIAGNOSIS — Z3A08 8 weeks gestation of pregnancy: Secondary | ICD-10-CM | POA: Insufficient documentation

## 2014-10-25 LAB — GC/CHLAMYDIA PROBE AMP (~~LOC~~) NOT AT ARMC
CHLAMYDIA, DNA PROBE: NEGATIVE
Neisseria Gonorrhea: NEGATIVE

## 2014-10-25 NOTE — ED Provider Notes (Signed)
CSN: [id number]     Arrival date & time 10/25/14  2349 History  This chart was scribed for Shon Baton, MD by Budd Palmer, ED Scribe. This patient was seen in room APA05/APA05 and the patient's care was started at 12:05 AM.    Chief Complaint  Patient presents with  . Emesis   The history is provided by the patient. No language interpreter was used.   HPI Comments: Natasha Bean is a 29 y.o. pregnant female who presents to the Emergency Department complaining of black emesis onset 11 PM tonight. She reports associated LUQ abdominal pain (rated at 8/10), and lower back pain. She was seen in the ED yesterday and diagnosed with UTI but has not had a chance to fill her prescription of antibiotics. She has taken tylenol with no relief. This is her first pregnancy. Patient reports left upper quadrant and left back pain. Nothing seems to make it better or worse. She denies any hematuria. She does endorse dysuria. Denies any fevers. Denies any bloody stools. Patient states that right lower quadrant pain and vaginal bleeding from yesterday have resolved.  Patient had ultrasound yesterday confirming intrauterine pregnancy at 7 weeks and 6 days.  Past Medical History  Diagnosis Date  . Asthma   . Anemia   . Migraine    History reviewed. No pertinent past surgical history. Family History  Problem Relation Age of Onset  . Cancer Mother     throat  . Hypertension Father   . Diabetes Father   . Cancer Father     colon   Social History  Substance Use Topics  . Smoking status: Former Smoker -- 0.50 packs/day for 10 years    Types: Cigarettes  . Smokeless tobacco: Never Used  . Alcohol Use: No   OB History    Gravida Para Term Preterm AB TAB SAB Ectopic Multiple Living   1         0     Review of Systems  Constitutional: Negative for fever.  Respiratory: Negative for chest tightness and shortness of breath.   Cardiovascular: Negative for chest pain.  Gastrointestinal:  Positive for nausea, vomiting and abdominal pain. Negative for diarrhea.  Genitourinary: Positive for dysuria. Negative for hematuria.  Musculoskeletal: Positive for back pain.  Neurological: Negative for headaches.  Psychiatric/Behavioral: Negative for confusion.  All other systems reviewed and are negative.   Allergies  Benadryl and Fioricet  Home Medications   Prior to Admission medications   Medication Sig Start Date End Date Taking? Authorizing Provider  acetaminophen (TYLENOL) 500 MG tablet Take 1,000 mg by mouth every 6 (six) hours as needed. pain    Historical Provider, MD  cephALEXin (KEFLEX) 500 MG capsule Take 1 capsule (500 mg total) by mouth 2 (two) times daily. 10/26/14   Shon Baton, MD  Doxylamine-Pyridoxine 10-10 MG TBEC Take 2 tablets by mouth at bedtime. Two tabs at bedtime, Increase by one tablet in the morning, and one at lunch, as needed. 10/11/14   Tilda Burrow, MD  Prenatal Vit-Fe Fumarate-FA (PRENATAL COMPLETE) 14-0.4 MG TABS Take 1 tablet by mouth daily. Patient not taking: Reported on 10/24/2014 10/04/14   Glynn Octave, MD  ranitidine (ZANTAC) 150 MG capsule Take 1 capsule (150 mg total) by mouth daily. 10/26/14   Shon Baton, MD   BP 111/70 mmHg  Pulse 77  Temp(Src) 98 F (36.7 C)  Resp 18  Ht 5\' 3"  (1.6 m)  Wt 168 lb (76.204 kg)  BMI 29.77 kg/m2  SpO2 100%  LMP 09/01/2014 Physical Exam  Constitutional: She is oriented to person, place, and time. She appears well-developed and well-nourished. No distress.  HENT:  Head: Normocephalic and atraumatic.  Eyes: Pupils are equal, round, and reactive to light.  Cardiovascular: Normal rate, regular rhythm and normal heart sounds.   No murmur heard. Pulmonary/Chest: Effort normal and breath sounds normal. No respiratory distress. She has no wheezes.  Abdominal: Soft. Bowel sounds are normal. There is no tenderness. There is no rebound and no guarding.  Genitourinary:  No CVA tenderness   Neurological: She is alert and oriented to person, place, and time.  Skin: Skin is warm and dry.  Psychiatric: She has a normal mood and affect.  Nursing note and vitals reviewed.   ED Course  Procedures  DIAGNOSTIC STUDIES: Oxygen Saturation is 100% on RA, normal by my interpretation.    COORDINATION OF CARE: 12:07 AM - Discussed plans to order diagnostic studies. Pt advised of plan for treatment and pt agrees.  Labs Review Labs Reviewed  CBC WITH DIFFERENTIAL/PLATELET - Abnormal; Notable for the following:    RBC 3.13 (*)    Hemoglobin 9.4 (*)    HCT 28.1 (*)    All other components within normal limits  BASIC METABOLIC PANEL - Abnormal; Notable for the following:    CO2 21 (*)    Calcium 8.1 (*)    All other components within normal limits  URINALYSIS, ROUTINE W REFLEX MICROSCOPIC (NOT AT [facility]) - Abnormal; Notable for the following:    Color, Urine STRAW (*)    APPearance HAZY (*)    Specific Gravity, Urine <1.005 (*)    Hgb urine dipstick SMALL (*)    Leukocytes, UA LARGE (*)    All other components within normal limits  URINE MICROSCOPIC-ADD ON - Abnormal; Notable for the following:    Squamous Epithelial / LPF MANY (*)    Bacteria, UA MANY (*)    All other components within normal limits  URINE CULTURE  OCCULT BLOOD GASTRIC / DUODENUM (SPECIMEN CUP)    Imaging Review US Ob Comp Less 14 Wks  10/24/2014   CLINICAL DATA:  Pregnant, right lower pelvic pain, ovarian cyst  EXAM: OBSTETRIC <14 WK [place] AND TRANSVAGINAL OB  US DOPPLER ULTRASOUND OF OVARIES  TECHNIQUE: Both transabdominal and transvaginal ultrasound examinations were performed for complete evaluation of the gestation as well as the maternal uterus, adnexal regions, and pelvic cul-de-sac. Transvaginal technique was performed to assess early pregnancy.  Color and duplex Doppler ultrasound was utilized to evaluate blood flow to the ovaries.  COMPARISON:  10/18/2014  FINDINGS: Intrauterine gestational sac:  Visualized/normal in shape.  Yolk sac:  Present  Embryo:  Present  Cardiac Activity: Present  Heart Rate: 168 bpm  CRL:   15.0  mm   7 w 6 d                  [place] EDC: 06/06/2015  Maternal uterus/adnexae: Small subchronic hemorrhage.  Right ovary measures 7.2 x 2.8 x 4.4 cm and is notable for a simple 3.0 x 2.5 x 2.5 cm corpus luteal cyst. Additional 1.5 x 1.1 x 1.5 cm simple paraovarian cyst.  Left ovary measures 4.2 x 1.8 x 3.6 cm and is within normal limits.  Moderate pelvic/adnexal fluid.  Pulsed Doppler evaluation of both ovaries demonstrates normal appearing low-resistance arterial and venous waveforms.  IMPRESSION: Single live intrauterine gestation with estimated gestational age [redacted] weeks 6 days by crown-rump  length.  Simple 3.0 cm right corpus luteal cyst, physiologic.  No evidence of ovarian torsion.   Electronically Signed   By: Charline Bills M.D.   On: 10/24/2014 16:17   US Ob Transvaginal  10/24/2014   CLINICAL DATA:  Pregnant, right lower pelvic pain, ovarian cyst  EXAM: OBSTETRIC <14 WK [place] AND TRANSVAGINAL OB  US DOPPLER ULTRASOUND OF OVARIES  TECHNIQUE: Both transabdominal and transvaginal ultrasound examinations were performed for complete evaluation of the gestation as well as the maternal uterus, adnexal regions, and pelvic cul-de-sac. Transvaginal technique was performed to assess early pregnancy.  Color and duplex Doppler ultrasound was utilized to evaluate blood flow to the ovaries.  COMPARISON:  10/18/2014  FINDINGS: Intrauterine gestational sac: Visualized/normal in shape.  Yolk sac:  Present  Embryo:  Present  Cardiac Activity: Present  Heart Rate: 168 bpm  CRL:   15.0  mm   7 w 6 d                  [place] EDC: 06/06/2015  Maternal uterus/adnexae: Small subchronic hemorrhage.  Right ovary measures 7.2 x 2.8 x 4.4 cm and is notable for a simple 3.0 x 2.5 x 2.5 cm corpus luteal cyst. Additional 1.5 x 1.1 x 1.5 cm simple paraovarian cyst.  Left ovary measures 4.2 x 1.8 x 3.6 cm and is within  normal limits.  Moderate pelvic/adnexal fluid.  Pulsed Doppler evaluation of both ovaries demonstrates normal appearing low-resistance arterial and venous waveforms.  IMPRESSION: Single live intrauterine gestation with estimated gestational age [redacted] weeks 6 days by crown-rump length.  Simple 3.0 cm right corpus luteal cyst, physiologic.  No evidence of ovarian torsion.   Electronically Signed   By: Charline Bills M.D.   On: 10/24/2014 16:17   US Abdomen Limited  10/24/2014   CLINICAL DATA:  Acute right lower quadrant pain. Eight weeks pregnant.  EXAM: LIMITED ABDOMINAL ULTRASOUND  TECHNIQUE: Wallace Cullens scale imaging of the right lower quadrant was performed to evaluate for suspected appendicitis. Standard imaging planes and graded compression technique were utilized.  COMPARISON:  None.  FINDINGS: The appendix is not visualized.  Ancillary findings: None.  Factors affecting image quality: Body habitus.  IMPRESSION: Exam is somewhat limited due to body habitus. The appendix is not visualized. No fluid collection is noted.   Electronically Signed   By: Lupita Raider, M.D.   On: 10/24/2014 15:05   [place] Art/ven Flow Abd Pelv Doppler  10/24/2014   CLINICAL DATA:  Pregnant, right lower pelvic pain, ovarian cyst  EXAM: OBSTETRIC <14 WK [place] AND TRANSVAGINAL OB  US DOPPLER ULTRASOUND OF OVARIES  TECHNIQUE: Both transabdominal and transvaginal ultrasound examinations were performed for complete evaluation of the gestation as well as the maternal uterus, adnexal regions, and pelvic cul-de-sac. Transvaginal technique was performed to assess early pregnancy.  Color and duplex Doppler ultrasound was utilized to evaluate blood flow to the ovaries.  COMPARISON:  10/18/2014  FINDINGS: Intrauterine gestational sac: Visualized/normal in shape.  Yolk sac:  Present  Embryo:  Present  Cardiac Activity: Present  Heart Rate: 168 bpm  CRL:   15.0  mm   7 w 6 d                  [place] EDC: 06/06/2015  Maternal uterus/adnexae: Small subchronic  hemorrhage.  Right ovary measures 7.2 x 2.8 x 4.4 cm and is notable for a simple 3.0 x 2.5 x 2.5 cm corpus luteal cyst. Additional 1.5 x 1.1 x  1.5 cm simple paraovarian cyst.  Left ovary measures 4.2 x 1.8 x 3.6 cm and is within normal limits.  Moderate pelvic/adnexal fluid.  Pulsed Doppler evaluation of both ovaries demonstrates normal appearing low-resistance arterial and venous waveforms.  IMPRESSION: Single live intrauterine gestation with estimated gestational age [redacted] weeks 6 days by crown-rump length.  Simple 3.0 cm right corpus luteal cyst, physiologic.  No evidence of ovarian torsion.   Electronically Signed   By: Charline Bills M.D.   On: 10/24/2014 16:17   I, HORTON, COURTNEY F, personally reviewed and evaluated these images and lab results as part of my medical decision-making.   EKG Interpretation None      MDM   Final diagnoses:  UTI (lower urinary tract infection)  Non-intractable vomiting with nausea, vomiting of unspecified type  Anemia, unspecified anemia type   Patient presents with emesis, back pain, and abdominal pain.  She is almost [redacted] weeks pregnant. Ultrasound from yesterday reassuring. Patient's pain today is now left upper quadrant and left back. She got 1 g of Rocephin prior to discharge. Urine culture was pending. She has not taken any more antibiotics. She does not have CVA tenderness and is relatively nontender on exam. Vital signs are reassuring. She is afebrile. Basic labwork obtained.  CBC shows continued anemia with a hemoglobin now 9.4. Patient does describe dark vomit but denies any blood in her stools. This could be related to her pregnancy or mild gastritis given recurrent vomiting during pregnancy.  Gastric occult ordered but patient was unable to provide a specimen. Patient will be placed on Zantac and should follow closely with her OB. Urinalysis now shows too numerous to count white cells in the urine. Repeat culture sent. Patient given by him Rocephin. Given  that the patient is afebrile and her lab work is otherwise reassuring, feels this is still likely more a lower urinary tract infection. Discussed with patient the importance of filling her prescription and taking a full course of antibiotics. If she develops fever, worsening pain, inability to tolerate fluids she should be reevaluated. She should follow-up with her OB in 1-2 days for recheck.  After history, exam, and medical workup I feel the patient has been appropriately medically screened and is safe for discharge home. Pertinent diagnoses were discussed with the patient. Patient was given return precautions.  I personally performed the services described in this documentation, which was scribed in my presence. The recorded information has been reviewed and is accurate.   Shon Baton, MD 10/26/14 [phone]

## 2014-10-25 NOTE — ED Notes (Signed)
Pt c/o abd/back pain and started vomiting black emesis around 2300. Pt states she is [redacted] weeks pregnant.

## 2014-10-26 ENCOUNTER — Telehealth: Payer: Self-pay | Admitting: Adult Health

## 2014-10-26 LAB — BASIC METABOLIC PANEL
ANION GAP: 5 (ref 5–15)
BUN: 8 mg/dL (ref 6–20)
CHLORIDE: 111 mmol/L (ref 101–111)
CO2: 21 mmol/L — ABNORMAL LOW (ref 22–32)
CREATININE: 0.57 mg/dL (ref 0.44–1.00)
Calcium: 8.1 mg/dL — ABNORMAL LOW (ref 8.9–10.3)
GFR calc non Af Amer: >60 mL/min (ref >60)
Glucose, Bld: 96 mg/dL (ref 65–99)
POTASSIUM: 3.6 mmol/L (ref 3.5–5.1)
SODIUM: 137 mmol/L (ref 135–145)

## 2014-10-26 LAB — URINALYSIS, ROUTINE W REFLEX MICROSCOPIC
Bilirubin Urine: NEGATIVE
Glucose, UA: NEGATIVE mg/dL
Ketones, ur: NEGATIVE mg/dL
NITRITE: NEGATIVE
PROTEIN: NEGATIVE mg/dL
Specific Gravity, Urine: <1.005 — ABNORMAL LOW (ref 1.005–1.030)
UROBILINOGEN UA: 0.2 mg/dL (ref 0.0–1.0)
pH: 6.5 (ref 5.0–8.0)

## 2014-10-26 LAB — CBC WITH DIFFERENTIAL/PLATELET
BASOS ABS: 0 10*3/uL (ref 0.0–0.1)
BASOS PCT: 0 % (ref 0–1)
EOS ABS: 0.1 10*3/uL (ref 0.0–0.7)
Eosinophils Relative: 2 % (ref 0–5)
HCT: 28.1 % — ABNORMAL LOW (ref 36.0–46.0)
HEMOGLOBIN: 9.4 g/dL — AB (ref 12.0–15.0)
Lymphocytes Relative: 37 % (ref 12–46)
Lymphs Abs: 2.8 10*3/uL (ref 0.7–4.0)
MCH: 30 pg (ref 26.0–34.0)
MCHC: 33.5 g/dL (ref 30.0–36.0)
MCV: 89.8 fL (ref 78.0–100.0)
Monocytes Absolute: 0.6 10*3/uL (ref 0.1–1.0)
Monocytes Relative: 9 % (ref 3–12)
NEUTROS ABS: 4 10*3/uL (ref 1.7–7.7)
NEUTROS PCT: 52 % (ref 43–77)
Platelets: 223 10*3/uL (ref 150–400)
RBC: 3.13 MIL/uL — AB (ref 3.87–5.11)
RDW: 13.2 % (ref 11.5–15.5)
WBC: 7.6 10*3/uL (ref 4.0–10.5)

## 2014-10-26 LAB — URINE MICROSCOPIC-ADD ON

## 2014-10-26 LAB — URINE CULTURE

## 2014-10-26 MED ORDER — CEPHALEXIN 500 MG PO CAPS: 500.0000 mg | ORAL_CAPSULE | Freq: Two times a day (BID) | ORAL | Status: DC

## 2014-10-26 MED ORDER — RANITIDINE HCL 150 MG PO CAPS: 150.0000 mg | ORAL_CAPSULE | Freq: Every day | ORAL | Status: DC

## 2014-10-26 MED ORDER — PROMETHAZINE HCL 25 MG/ML IJ SOLN
25.0000 mg | Freq: Once | INTRAMUSCULAR | Status: AC
  Administered 2014-10-26: 25 mg via INTRAVENOUS
  Filled 2014-10-26: qty 1

## 2014-10-26 MED ORDER — CEFTRIAXONE SODIUM 1 G IJ SOLR
1.0000 g | Freq: Once | INTRAMUSCULAR | Status: AC
Start: 2014-10-26 — End: 2014-10-26
  Administered 2014-10-26: 1 g via INTRAMUSCULAR
  Filled 2014-10-26: qty 10

## 2014-10-26 MED ORDER — PANTOPRAZOLE SODIUM 40 MG PO TBEC
40.0000 mg | DELAYED_RELEASE_TABLET | Freq: Every day | ORAL | Status: DC
  Administered 2014-10-26: 40 mg via ORAL
  Filled 2014-10-26: qty 1

## 2014-10-26 MED ORDER — LIDOCAINE HCL (PF) 1 % IJ SOLN
INTRAMUSCULAR | Status: AC
  Administered 2014-10-26: 5 mL
  Filled 2014-10-26: qty 5

## 2014-10-26 NOTE — Telephone Encounter (Signed)
Pt states seen at [facility] ER on 10/24/2014 for abdominal pain, vaginal bleeding, early pregnancy nausea and vomiting. Pt states was told to f/u with our office 1-2 days. Pt given an appt with Cathie Beams, CNM on 10/28/2014. Pt states was told her hgb was low, unable to take pnv. Pt informed to take OTC Flinstones 2 tab daily, increase foods rich in Fe, red meats, beans, and green leafy vegetables. Pt verbalized understanding.

## 2014-10-26 NOTE — Discharge Instructions (Signed)
You were seen today for nausea and back pain. Your workup shows persistent evidence of urinary tract infection. It is very important that you get your prescription filled and finish a course of antibiotics. Her nausea could either be pregnancy related or early kidney infection. If you develop fever, worsening pain, inability to keep anything down, you need to be reevaluated.  Your red blood cell count is also trending downward. This can be common in pregnancy but she need to follow-up closely with her OB. You should follow-up with her OB in 1-2 days.  Urinary Tract Infection Urinary tract infections (UTIs) can develop anywhere along your urinary tract. Your urinary tract is your body's drainage system for removing wastes and extra water. Your urinary tract includes two kidneys, two ureters, a bladder, and a urethra. Your kidneys are a pair of bean-shaped organs. Each kidney is about the size of your fist. They are located below your ribs, one on each side of your spine. CAUSES Infections are caused by microbes, which are microscopic organisms, including fungi, viruses, and bacteria. These organisms are so small that they can only be seen through a microscope. Bacteria are the microbes that most commonly cause UTIs. SYMPTOMS  Symptoms of UTIs may vary by age and gender of the patient and by the location of the infection. Symptoms in young women typically include a frequent and intense urge to urinate and a painful, burning feeling in the bladder or urethra during urination. Older women and men are more likely to be tired, shaky, and weak and have muscle aches and abdominal pain. A fever may mean the infection is in your kidneys. Other symptoms of a kidney infection include pain in your back or sides below the ribs, nausea, and vomiting. DIAGNOSIS To diagnose a UTI, your caregiver will ask you about your symptoms. Your caregiver also will ask to provide a urine sample. The urine sample will be tested for  bacteria and white blood cells. White blood cells are made by your body to help fight infection. TREATMENT  Typically, UTIs can be treated with medication. Because most UTIs are caused by a bacterial infection, they usually can be treated with the use of antibiotics. The choice of antibiotic and length of treatment depend on your symptoms and the type of bacteria causing your infection. HOME CARE INSTRUCTIONS  If you were prescribed antibiotics, take them exactly as your caregiver instructs you. Finish the medication even if you feel better after you have only taken some of the medication.  Drink enough water and fluids to keep your urine clear or pale yellow.  Avoid caffeine, tea, and carbonated beverages. They tend to irritate your bladder.  Empty your bladder often. Avoid holding urine for long periods of time.  Empty your bladder before and after sexual intercourse.  After a bowel movement, women should cleanse from front to back. Use each tissue only once. SEEK MEDICAL CARE IF:   You have back pain.  You develop a fever.  Your symptoms do not begin to resolve within 3 days. SEEK IMMEDIATE MEDICAL CARE IF:   You have severe back pain or lower abdominal pain.  You develop chills.  You have nausea or vomiting.  You have continued burning or discomfort with urination. MAKE SURE YOU:   Understand these instructions.  Will watch your condition.  Will get help right away if you are not doing well or get worse. Document Released: 12/06/2004 Document Revised: 08/28/2011 Document Reviewed: 04/06/2011 [facility] Patient Information 2015 [place], [place].  This information is not intended to replace advice given to you by your health care provider. Make sure you discuss any questions you have with your health care provider. ° °

## 2014-10-27 LAB — URINE CULTURE: Culture: 2000

## 2014-10-28 ENCOUNTER — Ambulatory Visit (INDEPENDENT_AMBULATORY_CARE_PROVIDER_SITE_OTHER): Payer: Medicaid Other | Admitting: Advanced Practice Midwife

## 2014-10-28 ENCOUNTER — Encounter: Payer: Self-pay | Admitting: Advanced Practice Midwife

## 2014-10-28 VITALS — BP 108/62 | HR 72 | Wt 175.0 lb

## 2014-10-28 DIAGNOSIS — O23591 Infection of other part of genital tract in pregnancy, first trimester: Secondary | ICD-10-CM

## 2014-10-28 DIAGNOSIS — Z331 Pregnant state, incidental: Secondary | ICD-10-CM

## 2014-10-28 DIAGNOSIS — A5901 Trichomonal vulvovaginitis: Secondary | ICD-10-CM

## 2014-10-28 DIAGNOSIS — Z1389 Encounter for screening for other disorder: Secondary | ICD-10-CM

## 2014-10-28 LAB — POCT URINALYSIS DIPSTICK
Glucose, UA: NEGATIVE
Ketones, UA: NEGATIVE
NITRITE UA: NEGATIVE
PROTEIN UA: NEGATIVE
RBC UA: NEGATIVE

## 2014-10-28 MED ORDER — METRONIDAZOLE 500 MG PO TABS: 2000.0000 mg | ORAL_TABLET | Freq: Once | ORAL | Status: DC

## 2014-10-28 NOTE — Progress Notes (Signed)
[facility] Visit  Patient name: Natasha Bean MRN [id number]  Date of birth: 1985-03-18  CC & HPI:  Natasha Bean is a 29 y.o. African American female presenting today for f/u from ED.  She was seen 2 days ago for abdominal pain and dx with a UTI and trichomonas.  She was given abx for UTI but not trich (ED MD nervous about using flagyl in 1st trimester).  Her urine culture is  Back and does not show any growth.  Her boyfriend denies cheating and "hasn't been seen since.".   Pertinent History Reviewed:  Medical & Surgical Hx:   Past Medical History  Diagnosis Date  . Asthma   . Anemia   . Migraine    History reviewed. No pertinent past surgical history. Family History  Problem Relation Age of Onset  . Cancer Mother     throat  . Hypertension Father   . Diabetes Father   . Cancer Father     colon    Current outpatient prescriptions:  .  acetaminophen (TYLENOL) 500 MG tablet, Take 1,000 mg by mouth every 6 (six) hours as needed. pain, Disp: , Rfl:  .  cephALEXin (KEFLEX) 500 MG capsule, Take 1 capsule (500 mg total) by mouth 2 (two) times daily., Disp: 14 capsule, Rfl: 0 .  Doxylamine-Pyridoxine 10-10 MG TBEC, Take 2 tablets by mouth at bedtime. Two tabs at bedtime, Increase by one tablet in the morning, and one at lunch, as needed., Disp: 60 tablet, Rfl: 3 .  Pediatric Multiple Vit-C-FA (FLINSTONES GUMMIES OMEGA-3 DHA PO), Take by mouth. 2 daily, Disp: , Rfl:  .  ranitidine (ZANTAC) 150 MG capsule, Take 1 capsule (150 mg total) by mouth daily., Disp: 30 capsule, Rfl: 0 .  metroNIDAZOLE (FLAGYL) 500 MG tablet, Take 4 tablets (2,000 mg total) by mouth once., Disp: 4 tablet, Rfl: 0 .  Prenatal Vit-Fe Fumarate-FA (PRENATAL COMPLETE) 14-0.4 MG TABS, Take 1 tablet by mouth daily. (Patient not taking: Reported on 10/24/2014), Disp: 60 each, Rfl: 0 Social History: Reviewed -  reports that she has quit smoking. Her smoking use included Cigarettes. She has a 5 pack-year  smoking history. She has never used smokeless tobacco.  Review of Systems:   Constitutional: Negative for fever and chills Eyes: Negative for visual disturbances Respiratory: Negative for shortness of breath, dyspnea Cardiovascular: Negative for chest pain or palpitations  Gastrointestinal: Negative for vomiting, diarrhea and constipation. Still has some lower abdominal/pelvic discomfort Genitourinary: Negative for dysuria and urgency, positive for vaginal irritation Musculoskeletal: Negative for back pain, joint pain, myalgias  Neurological: Negative for dizziness and headaches    Objective Findings:  Vitals: BP 108/62 mmHg  Pulse 72  Wt 79.379 kg (175 lb)  LMP 09/01/2014  Physical Examination: General appearance - alert, well appearing, and in no distress Mental status - alert, oriented to person, place, and time Chest - normal respiratory effort Back exam - no CVAT Musculoskeletal - full range of motion without pain Extremities - no pedal edema noted  Results for orders placed or performed in visit on 10/28/14 (from the past 24 hour(s))  POCT urinalysis dipstick   Collection Time: 10/28/14  9:16 AM  Result Value Ref Range   Color, UA     Clarity, UA     Glucose, UA neg    Bilirubin, UA     Ketones, UA neg    Spec Grav, UA     Blood, UA neg    pH, UA  Protein, UA neg    Urobilinogen, UA     Nitrite, UA neg    Leukocytes, UA moderate (2+) (A) Negative        Assessment & Plan:  A:   Symptomatic trichomonas--there is no evidence that flagyl is not safe at any time of pregnancy, and the risks of untreated, symptomatic trich (SAB, PID) necessitate treatment P:  Rx flagyl 2gm PO.  Rx for partner given to pt in case he comes back   DC abx for UTI d/t culture being neg   F/U as scheduled for New OB   CRESENZO-DISHMAN,Agueda Houpt CNM 10/28/2014 7:05 PM

## 2014-11-01 ENCOUNTER — Other Ambulatory Visit (HOSPITAL_COMMUNITY)
Admission: RE | Admit: 2014-11-01 | Discharge: 2014-11-01 | Disposition: A | Discharge status: Home / Self Care | Payer: Medicaid Other | Source: Ambulatory Visit | Attending: Adult Health | Admitting: Adult Health

## 2014-11-01 ENCOUNTER — Ambulatory Visit (INDEPENDENT_AMBULATORY_CARE_PROVIDER_SITE_OTHER): Payer: Medicaid Other | Admitting: Adult Health

## 2014-11-01 ENCOUNTER — Encounter: Payer: Self-pay | Admitting: Adult Health

## 2014-11-01 VITALS — BP 128/70 | HR 80 | Wt 171.2 lb

## 2014-11-01 DIAGNOSIS — Z87898 Personal history of other specified conditions: Secondary | ICD-10-CM

## 2014-11-01 DIAGNOSIS — Z01419 Encounter for gynecological examination (general) (routine) without abnormal findings: Secondary | ICD-10-CM | POA: Diagnosis not present

## 2014-11-01 DIAGNOSIS — Z3491 Encounter for supervision of normal pregnancy, unspecified, first trimester: Secondary | ICD-10-CM | POA: Diagnosis not present

## 2014-11-01 DIAGNOSIS — Z113 Encounter for screening for infections with a predominantly sexual mode of transmission: Secondary | ICD-10-CM | POA: Diagnosis present

## 2014-11-01 DIAGNOSIS — Z0283 Encounter for blood-alcohol and blood-drug test: Secondary | ICD-10-CM

## 2014-11-01 DIAGNOSIS — F1291 Cannabis use, unspecified, in remission: Secondary | ICD-10-CM

## 2014-11-01 DIAGNOSIS — Z3682 Encounter for antenatal screening for nuchal translucency: Secondary | ICD-10-CM

## 2014-11-01 DIAGNOSIS — O09899 Supervision of other high risk pregnancies, unspecified trimester: Secondary | ICD-10-CM | POA: Insufficient documentation

## 2014-11-01 DIAGNOSIS — Z331 Pregnant state, incidental: Secondary | ICD-10-CM

## 2014-11-01 DIAGNOSIS — Z369 Encounter for antenatal screening, unspecified: Secondary | ICD-10-CM

## 2014-11-01 DIAGNOSIS — Z1389 Encounter for screening for other disorder: Secondary | ICD-10-CM

## 2014-11-01 DIAGNOSIS — Z1329 Encounter for screening for other suspected endocrine disorder: Secondary | ICD-10-CM

## 2014-11-01 DIAGNOSIS — Z1151 Encounter for screening for human papillomavirus (HPV): Secondary | ICD-10-CM | POA: Insufficient documentation

## 2014-11-01 HISTORY — DX: Personal history of other specified conditions: Z87.898

## 2014-11-01 HISTORY — DX: Cannabis use, unspecified, in remission: F12.91

## 2014-11-01 LAB — POCT URINALYSIS DIPSTICK
GLUCOSE UA: NEGATIVE
Ketones, UA: NEGATIVE
NITRITE UA: NEGATIVE

## 2014-11-01 NOTE — Progress Notes (Signed)
Subjective:  Natasha Bean is a 29 y.o. G1P0 African American female at [redacted]w[redacted]d by [place] and LMP being seen today for her first obstetrical visit.  Her obstetrical history is significant for single mom.  Pregnancy history fully reviewed.  Patient reports headache, nausea and LUQ on and off. Denies vb, cramping, uti s/s,has trich, has not taken meds yet  BP 128/70 mmHg  Pulse 80  Wt 171 lb 3.2 oz (77.656 kg)  LMP 09/01/2014  HISTORY: OB History  Gravida Para Term Preterm AB SAB TAB Ectopic Multiple Living  1         0    # Outcome Date GA Lbr Len/2nd Weight Sex Delivery Anes PTL Lv  1 Current              Past Medical History  Diagnosis Date  . Asthma   . Anemia   . Migraine    History reviewed. No pertinent past surgical history. Family History  Problem Relation Age of Onset  . Cancer Mother     throat  . Hypertension Father   . Diabetes Father   . Cancer Father     colon  . Heart disease Paternal Grandmother   . Hypertension Paternal Grandmother   . Diabetes Paternal Grandmother     Exam   System:     General: Well developed & nourished, no acute distress   Skin: Warm & dry, normal coloration and turgor, no rashes   Neurologic: Alert & oriented, normal mood   Cardiovascular: Regular rate & rhythm   Respiratory: Effort & rate normal, LCTAB, acyanotic   Abdomen: Soft, non tender   Extremities: normal strength, tone   Pelvic Exam:    Perineum: Normal perineum   Vulva: Normal, no lesions   Vagina:  Normal mucosa, tan discharge, has hx trich   Cervix: Normal, appears closed   Uterus: Normal size/shape/contour for GA   Thin prep pap smear with GC/CHL and HPV performed FHR: 176 via [place]   Assessment:   Pregnancy: G1P0 Patient Active Problem List   Diagnosis Date Noted  . Supervision of normal pregnancy 11/01/2014  . Trichomonal vaginitis during pregnancy in first trimester 10/28/2014    [redacted]w[redacted]d G1P0 New OB visit     Plan:  Initial labs drawn Continue  prenatal vitamins Problem list reviewed and updated Reviewed n/v relief measures and warning s/s to report Reviewed recommended weight gain based on pre-gravid BMI Encouraged well-balanced diet Genetic Screening discussed Integrated Screen: requested Cystic fibrosis screening discussed requested Ultrasound discussed; fetal survey: requested Follow up in 3 weeks for IN/NT and see Dr Emelda Fear Try tylenol for headache,push fluids, eat often like every 2 hours Adline Potter, NP 11/01/2014 11:10 AM

## 2014-11-01 NOTE — Patient Instructions (Signed)
First Trimester of Pregnancy The first trimester of pregnancy is from week 1 until the end of week 12 (months 1 through 3). A week after a sperm fertilizes an egg, the egg will implant on the wall of the uterus. This embryo will begin to develop into a baby. Genes from you and your partner are forming the baby. The female genes determine whether the baby is a boy or a girl. At 6-8 weeks, the eyes and face are formed, and the heartbeat can be seen on ultrasound. At the end of 12 weeks, all the baby's organs are formed.  Now that you are pregnant, you will want to do everything you can to have a healthy baby. Two of the most important things are to get good prenatal care and to follow your health care provider's instructions. Prenatal care is all the medical care you receive before the baby's birth. This care will help prevent, find, and treat any problems during the pregnancy and childbirth. BODY CHANGES Your body goes through many changes during pregnancy. The changes vary from woman to woman.   You may gain or lose a couple of pounds at first.  You may feel sick to your stomach (nauseous) and throw up (vomit). If the vomiting is uncontrollable, call your health care provider.  You may tire easily.  You may develop headaches that can be relieved by medicines approved by your health care provider.  You may urinate more often. Painful urination may mean you have a bladder infection.  You may develop heartburn as a result of your pregnancy.  You may develop constipation because certain hormones are causing the muscles that push waste through your intestines to slow down.  You may develop hemorrhoids or swollen, bulging veins (varicose veins).  Your breasts may begin to grow larger and become tender. Your nipples may stick out more, and the tissue that surrounds them (areola) may become darker.  Your gums may bleed and may be sensitive to brushing and flossing.  Dark spots or blotches (chloasma,  mask of pregnancy) may develop on your face. This will likely fade after the baby is born.  Your menstrual periods will stop.  You may have a loss of appetite.  You may develop cravings for certain kinds of food.  You may have changes in your emotions from day to day, such as being excited to be pregnant or being concerned that something may go wrong with the pregnancy and baby.  You may have more vivid and strange dreams.  You may have changes in your hair. These can include thickening of your hair, rapid growth, and changes in texture. Some women also have hair loss during or after pregnancy, or hair that feels dry or thin. Your hair will most likely return to normal after your baby is born. WHAT TO EXPECT AT YOUR PRENATAL VISITS During a routine prenatal visit:  You will be weighed to make sure you and the baby are growing normally.  Your blood pressure will be taken.  Your abdomen will be measured to track your baby's growth.  The fetal heartbeat will be listened to starting around week 10 or 12 of your pregnancy.  Test results from any previous visits will be discussed. Your health care provider may ask you:  How you are feeling.  If you are feeling the baby move.  If you have had any abnormal symptoms, such as leaking fluid, bleeding, severe headaches, or abdominal cramping.  If you have any questions. Other tests   that may be performed during your first trimester include:  Blood tests to find your blood type and to check for the presence of any previous infections. They will also be used to check for low iron levels (anemia) and Rh antibodies. Later in the pregnancy, blood tests for diabetes will be done along with other tests if problems develop.  Urine tests to check for infections, diabetes, or protein in the urine.  An ultrasound to confirm the proper growth and development of the baby.  An amniocentesis to check for possible genetic problems.  Fetal screens for  spina bifida and Down syndrome.  You may need other tests to make sure you and the baby are doing well. HOME CARE INSTRUCTIONS  Medicines  Follow your health care provider's instructions regarding medicine use. Specific medicines may be either safe or unsafe to take during pregnancy.  Take your prenatal vitamins as directed.  If you develop constipation, try taking a stool softener if your health care provider approves. Diet  Eat regular, well-balanced meals. Choose a variety of foods, such as meat or vegetable-based protein, fish, milk and low-fat dairy products, vegetables, fruits, and whole grain breads and cereals. Your health care provider will help you determine the amount of weight gain that is right for you.  Avoid raw meat and uncooked cheese. These carry germs that can cause birth defects in the baby.  Eating four or five small meals rather than three large meals a day may help relieve nausea and vomiting. If you start to feel nauseous, eating a few soda crackers can be helpful. Drinking liquids between meals instead of during meals also seems to help nausea and vomiting.  If you develop constipation, eat more high-fiber foods, such as fresh vegetables or fruit and whole grains. Drink enough fluids to keep your urine clear or pale yellow. Activity and Exercise  Exercise only as directed by your health care provider. Exercising will help you:  Control your weight.  Stay in shape.  Be prepared for labor and delivery.  Experiencing pain or cramping in the lower abdomen or low back is a good sign that you should stop exercising. Check with your health care provider before continuing normal exercises.  Try to avoid standing for long periods of time. Move your legs often if you must stand in one place for a long time.  Avoid heavy lifting.  Wear low-heeled shoes, and practice good posture.  You may continue to have sex unless your health care provider directs you  otherwise. Relief of Pain or Discomfort  Wear a good support bra for breast tenderness.   Take warm sitz baths to soothe any pain or discomfort caused by hemorrhoids. Use hemorrhoid cream if your health care provider approves.   Rest with your legs elevated if you have leg cramps or low back pain.  If you develop varicose veins in your legs, wear support hose. Elevate your feet for 15 minutes, 3-4 times a day. Limit salt in your diet. Prenatal Care  Schedule your prenatal visits by the twelfth week of pregnancy. They are usually scheduled monthly at first, then more often in the last 2 months before delivery.  Write down your questions. Take them to your prenatal visits.  Keep all your prenatal visits as directed by your health care provider. Safety  Wear your seat belt at all times when driving.  Make a list of emergency phone numbers, including numbers for family, friends, the hospital, and police and fire departments. General Tips    Ask your health care provider for a referral to a local prenatal education class. Begin classes no later than at the beginning of month 6 of your pregnancy.  Ask for help if you have counseling or nutritional needs during pregnancy. Your health care provider can offer advice or refer you to specialists for help with various needs.  Do not use hot tubs, steam rooms, or saunas.  Do not douche or use tampons or scented sanitary pads.  Do not cross your legs for long periods of time.  Avoid cat litter boxes and soil used by cats. These carry germs that can cause birth defects in the baby and possibly loss of the fetus by miscarriage or stillbirth.  Avoid all smoking, herbs, alcohol, and medicines not prescribed by your health care provider. Chemicals in these affect the formation and growth of the baby.  Schedule a dentist appointment. At home, brush your teeth with a soft toothbrush and be gentle when you floss. SEEK MEDICAL CARE IF:   You have  dizziness.  You have mild pelvic cramps, pelvic pressure, or nagging pain in the abdominal area.  You have persistent nausea, vomiting, or diarrhea.  You have a bad smelling vaginal discharge.  You have pain with urination.  You notice increased swelling in your face, hands, legs, or ankles. SEEK IMMEDIATE MEDICAL CARE IF:   You have a fever.  You are leaking fluid from your vagina.  You have spotting or bleeding from your vagina.  You have severe abdominal cramping or pain.  You have rapid weight gain or loss.  You vomit blood or material that looks like coffee grounds.  You are exposed to [place] measles and have never had them.  You are exposed to fifth disease or chickenpox.  You develop a severe headache.  You have shortness of breath.  You have any kind of trauma, such as from a fall or a car accident. Document Released: 02/20/2001 Document Revised: 07/13/2013 Document Reviewed: 01/06/2013 [facility] Patient Information 2015 [place], [place]. This information is not intended to replace advice given to you by your health care provider. Make sure you discuss any questions you have with your health care provider. Return in 3 weeks for [place] IT/NT and see dr Emelda Fear

## 2014-11-02 LAB — URINE CULTURE: ORGANISM ID, BACTERIA: NO GROWTH

## 2014-11-02 LAB — CYTOLOGY - PAP

## 2014-11-08 LAB — CBC
HEMATOCRIT: 31.5 % — AB (ref 34.0–46.6)
Hemoglobin: 10.5 g/dL — ABNORMAL LOW (ref 11.1–15.9)
MCH: 29.4 pg (ref 26.6–33.0)
MCHC: 33.3 g/dL (ref 31.5–35.7)
MCV: 88 fL (ref 79–97)
PLATELETS: 276 10*3/uL (ref 150–379)
RBC: 3.57 x10E6/uL — ABNORMAL LOW (ref 3.77–5.28)
RDW: 14.5 % (ref 12.3–15.4)
WBC: 7.2 10*3/uL (ref 3.4–10.8)

## 2014-11-08 LAB — PMP SCREEN PROFILE (10S), URINE
Amphetamine Screen, Ur: NEGATIVE ng/mL
BENZODIAZEPINE SCREEN, URINE: NEGATIVE ng/mL
Barbiturate Screen, Ur: NEGATIVE ng/mL
CANNABINOIDS UR QL SCN: POSITIVE ng/mL
COCAINE(METAB.) SCREEN, URINE: NEGATIVE ng/mL
CREATININE(CRT), U: 81.1 mg/dL (ref 20.0–300.0)
Methadone Scn, Ur: NEGATIVE ng/mL
Opiate Scrn, Ur: NEGATIVE ng/mL
Oxycodone+Oxymorphone Ur Ql Scn: NEGATIVE ng/mL
PCP Scrn, Ur: NEGATIVE ng/mL
PH UR, DRUG SCRN: 5.4 (ref 4.5–8.9)
Propoxyphene, Screen: NEGATIVE ng/mL

## 2014-11-08 LAB — URINALYSIS, ROUTINE W REFLEX MICROSCOPIC
Bilirubin, UA: NEGATIVE
Glucose, UA: NEGATIVE
Ketones, UA: NEGATIVE
Nitrite, UA: NEGATIVE
PH UA: 6 (ref 5.0–7.5)
PROTEIN UA: NEGATIVE
Specific Gravity, UA: 1.013 (ref 1.005–1.030)
UUROB: 0.2 mg/dL (ref 0.2–1.0)

## 2014-11-08 LAB — TSH: TSH: 1.91 u[IU]/mL (ref 0.450–4.500)

## 2014-11-08 LAB — RPR: RPR Ser Ql: NONREACTIVE

## 2014-11-08 LAB — MICROSCOPIC EXAMINATION: Casts: NONE SEEN /lpf

## 2014-11-08 LAB — CYSTIC FIBROSIS MUTATION 97: Interpretation: NOT DETECTED

## 2014-11-08 LAB — HIV ANTIBODY (ROUTINE TESTING W REFLEX): HIV Screen 4th Generation wRfx: NONREACTIVE

## 2014-11-08 LAB — ABO/RH: RH TYPE: POSITIVE

## 2014-11-08 LAB — RUBELLA SCREEN: RUBELLA: 1.69 {index} (ref >0.99)

## 2014-11-08 LAB — HEPATITIS B SURFACE ANTIGEN: Hepatitis B Surface Ag: NEGATIVE

## 2014-11-08 LAB — VARICELLA ZOSTER ANTIBODY, IGG: VARICELLA: 425 {index} (ref >165)

## 2014-11-08 LAB — SICKLE CELL SCREEN: SICKLE CELL SCREEN: NEGATIVE

## 2014-11-08 LAB — ANTIBODY SCREEN: ANTIBODY SCREEN: NEGATIVE

## 2014-11-22 ENCOUNTER — Ambulatory Visit (INDEPENDENT_AMBULATORY_CARE_PROVIDER_SITE_OTHER): Payer: Medicaid Other

## 2014-11-22 ENCOUNTER — Ambulatory Visit (INDEPENDENT_AMBULATORY_CARE_PROVIDER_SITE_OTHER): Payer: Medicaid Other | Admitting: Obstetrics and Gynecology

## 2014-11-22 ENCOUNTER — Encounter: Payer: Self-pay | Admitting: Obstetrics and Gynecology

## 2014-11-22 VITALS — BP 122/72 | HR 68

## 2014-11-22 DIAGNOSIS — Z369 Encounter for antenatal screening, unspecified: Secondary | ICD-10-CM

## 2014-11-22 DIAGNOSIS — Z3682 Encounter for antenatal screening for nuchal translucency: Secondary | ICD-10-CM

## 2014-11-22 DIAGNOSIS — Z36 Encounter for antenatal screening of mother: Secondary | ICD-10-CM | POA: Diagnosis not present

## 2014-11-22 DIAGNOSIS — Z331 Pregnant state, incidental: Secondary | ICD-10-CM

## 2014-11-22 DIAGNOSIS — Z3401 Encounter for supervision of normal first pregnancy, first trimester: Secondary | ICD-10-CM

## 2014-11-22 DIAGNOSIS — Z1389 Encounter for screening for other disorder: Secondary | ICD-10-CM

## 2014-11-22 NOTE — Progress Notes (Signed)
[place] 11+5wks,measurements c/w dates,crl 54.1 mm,NT 1.4mm,NB present,fht 171 bpm,post pl gr 0, normal lt ov,3 x 3 x 2.9 cm simple cl cyst

## 2014-11-22 NOTE — Progress Notes (Signed)
Pt states that the Diclegis is not helping her nausea.

## 2014-11-22 NOTE — Progress Notes (Signed)
Patient ID: Natasha Bean, female   DOB: September 03, 1985, 29 y.o.   MRN: [id number]  G1P0 [redacted]w[redacted]d Estimated Date of Delivery: 06/08/15  Blood pressure 122/72, pulse 68, last menstrual period 09/01/2014.   refer to the ob flow sheet for FH and FHR, also BP, Wt, Urine results:notable for negative  Patient reports  no fetal movement, denies any bleeding and no rupture of membranes symptoms or regular contractions. Patient complaints: Pt reports intermittent nausea. She was started on Diclegis which has not improved symptoms. Pt states that she works 6 days weekly and has increased soreness and fatigue.   Questions were answered. Assessment: [redacted]w[redacted]d; G1P0  Plan:  Continued routine obstetrical care. Discussed childbirth classes.  F/u in 4 weeks for routine care     This chart was scribed for Tilda Burrow, MD by Gwenyth Ober, Medical Scribe. This patient was seen in room 2 and the patient's care was started at 10:23 AM.   I personally performed the services described in this documentation, which was SCRIBED in my presence. The recorded information has been reviewed and considered accurate. It has been edited as necessary during review. Tilda Burrow, MD

## 2014-11-24 LAB — MATERNAL SCREEN, INTEGRATED #1
CROWN RUMP LENGTH MAT SCREEN: 54.1 mm
Gest. Age on Collection Date: 12 weeks
Maternal Age at EDD: 31.1 years
Nuchal Translucency (NT): 1.3 mm
Number of Fetuses: 1
PAPP-A Value: 2643.3 ng/mL
Weight: 175 [lb_av]

## 2014-12-20 ENCOUNTER — Encounter: Payer: Self-pay | Admitting: Obstetrics and Gynecology

## 2014-12-20 ENCOUNTER — Ambulatory Visit (INDEPENDENT_AMBULATORY_CARE_PROVIDER_SITE_OTHER): Payer: Medicaid Other | Admitting: Obstetrics and Gynecology

## 2014-12-20 VITALS — BP 136/70 | HR 84 | Wt 177.0 lb

## 2014-12-20 DIAGNOSIS — Z369 Encounter for antenatal screening, unspecified: Secondary | ICD-10-CM

## 2014-12-20 DIAGNOSIS — Z3402 Encounter for supervision of normal first pregnancy, second trimester: Secondary | ICD-10-CM

## 2014-12-20 DIAGNOSIS — Z331 Pregnant state, incidental: Secondary | ICD-10-CM

## 2014-12-20 DIAGNOSIS — Z1389 Encounter for screening for other disorder: Secondary | ICD-10-CM

## 2014-12-20 LAB — POCT URINALYSIS DIPSTICK
Blood, UA: 3
GLUCOSE UA: NEGATIVE
GLUCOSE UA: NEGATIVE
KETONES UA: NEGATIVE
Leukocytes, UA: NEGATIVE
Nitrite, UA: NEGATIVE
Protein, UA: NEGATIVE
Protein, UA: NEGATIVE

## 2014-12-20 NOTE — Progress Notes (Signed)
Patient ID: Natasha Bean, female   DOB: February 11, 1986, 29 y.o.   MRN: [id number]  G1P0 [redacted]w[redacted]d Estimated Date of Delivery: 06/08/15  Blood pressure 136/70, pulse 84, weight 177 lb (80.287 kg), last menstrual period 09/01/2014.   refer to the ob flow sheet for FH and FHR, also BP, Wt, Urine results:notable for negative  Patient reports  + good fetal movement, denies any bleeding and no rupture of membranes symptoms or regular contractions. Patient complaints: Pt complains of intermittent heart burn. She is also requesting a prescription for regular pre-natal vitamins. She was taking a daily [place] vitamin during her trimester.  FHR-153 bpm  Questions were answered. Assessment: [redacted]w[redacted]d; G1P0  Plan:  Continued routine obstetrical care  F/u in 4 weeks for routine prenatal care   By signing my name below, I, Gwenyth Ober, attest that this documentation has been prepared under the direction and in the presence of Tilda Burrow, MD. Electronically Signed: Gwenyth Ober, ED Scribe. 12/20/2014. 9:35 AM.  I personally performed the services described in this documentation, which was SCRIBED in my presence. The recorded information has been reviewed and considered accurate. It has been edited as necessary during review. Tilda Burrow, MD

## 2014-12-20 NOTE — Progress Notes (Signed)
Pt denies any problems or concerns at this time.  

## 2014-12-22 ENCOUNTER — Telehealth: Payer: Self-pay | Admitting: Obstetrics and Gynecology

## 2014-12-22 LAB — MATERNAL SCREEN, INTEGRATED #2
AFP MARKER: 21.6 ng/mL
AFP MoM: 0.73
Crown Rump Length: 54.1 mm
DIA MOM: 3.13
DIA VALUE: 498 pg/mL
ESTRIOL UNCONJUGATED: 0.83 ng/mL
Gest. Age on Collection Date: 12 weeks
Gestational Age: 16 weeks
HCG MOM: 1.64
Maternal Age at EDD: 31.1 years
NUCHAL TRANSLUCENCY (NT): 1.3 mm
NUCHAL TRANSLUCENCY MOM: 0.91
NUMBER OF FETUSES: 1
PAPP-A MoM: 3.98
PAPP-A Value: 2643.3 ng/mL
Test Results:: NEGATIVE
WEIGHT: 175 [lb_av]
Weight: 177 [lb_av]
hCG Value: 53.2 IU/mL
uE3 MoM: 1.02

## 2014-12-22 NOTE — Telephone Encounter (Signed)
Pt states that she has a stuffy nose and her throat is sore. Pt advised to try sudafed and cough drops and warm salt water gargles. Pt verbalized understanding.

## 2014-12-27 ENCOUNTER — Encounter (HOSPITAL_COMMUNITY): Payer: Self-pay | Admitting: Emergency Medicine

## 2014-12-27 ENCOUNTER — Emergency Department (HOSPITAL_COMMUNITY)
Admission: EM | Admit: 2014-12-27 | Discharge: 2014-12-27 | Disposition: A | Discharge status: Home / Self Care | Payer: Medicaid Other | Attending: Emergency Medicine | Admitting: Emergency Medicine

## 2014-12-27 DIAGNOSIS — K047 Periapical abscess without sinus: Secondary | ICD-10-CM | POA: Diagnosis not present

## 2014-12-27 DIAGNOSIS — Z87891 Personal history of nicotine dependence: Secondary | ICD-10-CM | POA: Insufficient documentation

## 2014-12-27 DIAGNOSIS — Z862 Personal history of diseases of the blood and blood-forming organs and certain disorders involving the immune mechanism: Secondary | ICD-10-CM | POA: Insufficient documentation

## 2014-12-27 DIAGNOSIS — Z79899 Other long term (current) drug therapy: Secondary | ICD-10-CM | POA: Diagnosis not present

## 2014-12-27 DIAGNOSIS — J45909 Unspecified asthma, uncomplicated: Secondary | ICD-10-CM | POA: Diagnosis not present

## 2014-12-27 DIAGNOSIS — Z8679 Personal history of other diseases of the circulatory system: Secondary | ICD-10-CM | POA: Diagnosis not present

## 2014-12-27 DIAGNOSIS — K0889 Other specified disorders of teeth and supporting structures: Secondary | ICD-10-CM | POA: Diagnosis present

## 2014-12-27 MED ORDER — HYDROCODONE-ACETAMINOPHEN 5-325 MG PO TABS: 1.0000 | ORAL_TABLET | ORAL | Status: DC | PRN

## 2014-12-27 MED ORDER — AMOXICILLIN 500 MG PO CAPS: 500.0000 mg | ORAL_CAPSULE | Freq: Three times a day (TID) | ORAL | Status: DC

## 2014-12-27 NOTE — ED Notes (Signed)
Pt c/o of RT sided dental pain x 1 day. States pain radiates to RT ear. Airway patent. Pt [redacted] weeks pregnant.

## 2014-12-27 NOTE — ED Provider Notes (Signed)
CSN: [id number]     Arrival date & time 12/27/14  [id number] History   First MD Initiated Contact with Patient 12/27/14 [phone]     Chief Complaint  Patient presents with  . Dental Pain     (Consider location/radiation/quality/duration/timing/severity/associated sxs/prior Treatment) The history is provided by the patient.   Natasha Bean is a 29 y.o. female presenting with a 1 day history of dental pain and gingival swelling.   The patient has a history of  decay in the tooth involved which has recently started to cause increased  pain.  There has been no fevers, chills, nausea or vomiting, also no complaint of difficulty swallowing, although chewing makes pain worse.  The patient has tried tylnenol without relief of symptoms.  She sees Dr. Blondell RevealJudkins for dental care but was unable to contact him this morning.     Past Medical History  Diagnosis Date  . Asthma   . Anemia   . Migraine    History reviewed. No pertinent past surgical history. Family History  Problem Relation Age of Onset  . Cancer Mother     throat  . Hypertension Father   . Diabetes Father   . Cancer Father     colon  . Heart disease Paternal Grandmother   . Hypertension Paternal Grandmother   . Diabetes Paternal Grandmother    Social History  Substance Use Topics  . Smoking status: Former Smoker -- 0.50 packs/day for 10 years    Types: Cigarettes  . Smokeless tobacco: Never Used  . Alcohol Use: No   OB History    Gravida Para Term Preterm AB TAB SAB Ectopic Multiple Living   1         0     Review of Systems  Constitutional: Negative for fever.  HENT: Positive for dental problem. Negative for facial swelling and sore throat.   Respiratory: Negative for shortness of breath.   Musculoskeletal: Negative for neck pain and neck stiffness.      Allergies  Benadryl and Fioricet  Home Medications   Prior to Admission medications   Medication Sig Start Date End Date Taking? Authorizing Provider    acetaminophen (TYLENOL) 500 MG tablet Take 1,000 mg by mouth every 6 (six) hours as needed. pain    Historical Provider, MD  amoxicillin (AMOXIL) 500 MG capsule Take 1 capsule (500 mg total) by mouth 3 (three) times daily. 12/27/14 01/06/15  Burgess AmorJulie Matej Sappenfield, PA-C  Doxylamine-Pyridoxine 10-10 MG TBEC Take 2 tablets by mouth at bedtime. Two tabs at bedtime, Increase by one tablet in the morning, and one at lunch, as needed. Patient not taking: Reported on 12/20/2014 10/11/14   Tilda BurrowJohn Ferguson V, MD  HYDROcodone-acetaminophen (NORCO/VICODIN) 5-325 MG tablet Take 1 tablet by mouth every 4 (four) hours as needed. 12/27/14   Burgess AmorJulie Adamariz Gillott, PA-C  Pediatric Multiple Vit-C-FA (FLINSTONES GUMMIES OMEGA-3 DHA PO) Take by mouth. 2 daily    Historical Provider, MD  ranitidine (ZANTAC) 150 MG capsule Take 1 capsule (150 mg total) by mouth daily. 10/26/14   Shon Batonourtney F Horton, MD   BP 121/79 mmHg  Pulse 92  Temp(Src) 99 F (37.2 C) (Oral)  Resp 16  Ht 5\' 2"  (1.575 m)  Wt 177 lb (80.287 kg)  BMI 32.37 kg/m2  SpO2 100%  LMP 09/01/2014 Physical Exam  Constitutional: She is oriented to person, place, and time. She appears well-developed and well-nourished. No distress.  HENT:  Head: Normocephalic and atraumatic.  Right Ear: Tympanic membrane and external ear  normal.  Left Ear: Tympanic membrane and external ear normal.  Mouth/Throat: Oropharynx is clear and moist and mucous membranes are normal. No oral lesions. No trismus in the jaw. Dental abscesses present.    Eyes: Conjunctivae are normal.  Neck: Normal range of motion. Neck supple.  Cardiovascular: Normal rate and normal heart sounds.   Pulmonary/Chest: Effort normal.  Abdominal: She exhibits no distension.  Musculoskeletal: Normal range of motion.  Lymphadenopathy:    She has no cervical adenopathy.  Neurological: She is alert and oriented to person, place, and time.  Skin: Skin is warm and dry. No erythema.  Psychiatric: She has a normal mood and  affect.    ED Course  Procedures (including critical care time) Labs Review Labs Reviewed - No data to display  Imaging Review No results found. I have personally reviewed and evaluated these images and lab results as part of my medical decision-making.   EKG Interpretation None      MDM   Final diagnoses:  Dental abscess    Patient was placed on amoxicillin.  Prescribed a few hydrocodone, advised to avoid any additional Tylenol while on the hydrocodone.  She may return to plain Tylenol once a hydrocodone is completed at which time hopefully she will have improved pain with amoxicillin onboard.  Encouraged follow-up with Dr. Blondell Reveal when necessary.    Burgess Amor, PA-C 12/27/14 1610  Leta Baptist, MD 12/27/14 2120

## 2014-12-27 NOTE — Discharge Instructions (Signed)
Dental Abscess A dental abscess is a collection of pus in or around a tooth. CAUSES This condition is caused by a bacterial infection around the root of the tooth that involves the inner part of the tooth (pulp). It may result from:  Severe tooth decay.  Trauma to the tooth that allows bacteria to enter into the pulp, such as a broken or chipped tooth.  Severe gum disease around a tooth. SYMPTOMS Symptoms of this condition include:  Severe pain in and around the infected tooth.  Swelling and redness around the infected tooth, in the mouth, or in the face.  Tenderness.  Pus drainage.  Bad breath.  Bitter taste in the mouth.  Difficulty swallowing.  Difficulty opening the mouth.  Nausea.  Vomiting.  Chills.  Swollen neck glands.  Fever. DIAGNOSIS This condition is diagnosed with examination of the infected tooth. During the exam, your dentist may tap on the infected tooth. Your dentist will also ask about your medical and dental history and may order X-rays. TREATMENT This condition is treated by eliminating the infection. This may be done with:  Antibiotic medicine.  A root canal. This may be performed to save the tooth.  Pulling (extracting) the tooth. This may also involve draining the abscess. This is done if the tooth cannot be saved. HOME CARE INSTRUCTIONS  Take medicines only as directed by your dentist.  If you were prescribed antibiotic medicine, finish all of it even if you start to feel better.  Rinse your mouth (gargle) often with salt water to relieve pain or swelling.  Do not drive or operate heavy machinery while taking pain medicine.  Do not apply heat to the outside of your mouth.  Keep all follow-up visits as directed by your dentist. This is important. SEEK MEDICAL CARE IF:  Your pain is worse and is not helped by medicine. SEEK IMMEDIATE MEDICAL CARE IF:  You have a fever or chills.  Your symptoms suddenly get worse.  You have a  very bad headache.  You have problems breathing or swallowing.  You have trouble opening your mouth.  You have swelling in your neck or around your eye.   This information is not intended to replace advice given to you by your health care provider. Make sure you discuss any questions you have with your health care provider.   Document Released: 02/26/2005 Document Revised: 07/13/2014 Document Reviewed: 02/23/2014 Elsevier Interactive Patient Education [employer] Elsevier Inc.   Take the entire course of the antibiotic prescribed.  You may safely take the hydrocodone but use caution as it will make you sleepy.  Do not drive within 4 hours of taking this medication.  Do not take any additional Tylenol while using this pain medicine.  Once the pain medicine is gone, you can switch back to Tylenol.  Follow-up with your dentist as planned.

## 2015-01-03 ENCOUNTER — Telehealth: Payer: Self-pay | Admitting: Women's Health

## 2015-01-03 MED ORDER — PANTOPRAZOLE SODIUM 20 MG PO TBEC: 20.0000 mg | DELAYED_RELEASE_TABLET | Freq: Every day | ORAL | Status: DC

## 2015-01-03 NOTE — Telephone Encounter (Signed)
Pt called in wanting rx for heartburn. Rx protonix 20mg  daily w/ 3RF.  Cheral MarkerKimberly R. Alora Gorey, CNM, WHNP-BC 01/03/2015 11:30 AM

## 2015-01-06 ENCOUNTER — Encounter: Payer: Self-pay | Admitting: Adult Health

## 2015-01-06 ENCOUNTER — Ambulatory Visit (INDEPENDENT_AMBULATORY_CARE_PROVIDER_SITE_OTHER): Payer: Medicaid Other | Admitting: Adult Health

## 2015-01-06 ENCOUNTER — Telehealth: Payer: Self-pay | Admitting: Obstetrics and Gynecology

## 2015-01-06 VITALS — BP 120/60 | HR 80 | Wt 179.5 lb

## 2015-01-06 DIAGNOSIS — Z1389 Encounter for screening for other disorder: Secondary | ICD-10-CM

## 2015-01-06 DIAGNOSIS — Z331 Pregnant state, incidental: Secondary | ICD-10-CM | POA: Diagnosis not present

## 2015-01-06 DIAGNOSIS — N949 Unspecified condition associated with female genital organs and menstrual cycle: Secondary | ICD-10-CM

## 2015-01-06 DIAGNOSIS — N898 Other specified noninflammatory disorders of vagina: Secondary | ICD-10-CM

## 2015-01-06 DIAGNOSIS — A599 Trichomoniasis, unspecified: Secondary | ICD-10-CM

## 2015-01-06 DIAGNOSIS — R350 Frequency of micturition: Secondary | ICD-10-CM

## 2015-01-06 DIAGNOSIS — R319 Hematuria, unspecified: Secondary | ICD-10-CM

## 2015-01-06 DIAGNOSIS — O26892 Other specified pregnancy related conditions, second trimester: Secondary | ICD-10-CM

## 2015-01-06 HISTORY — DX: Trichomoniasis, unspecified: A59.9

## 2015-01-06 HISTORY — DX: Other specified pregnancy related conditions, second trimester: N89.8

## 2015-01-06 HISTORY — DX: Other specified pregnancy related conditions, second trimester: O26.892

## 2015-01-06 HISTORY — DX: Hematuria, unspecified: R31.9

## 2015-01-06 HISTORY — DX: Frequency of micturition: R35.0

## 2015-01-06 HISTORY — DX: Unspecified condition associated with female genital organs and menstrual cycle: N94.9

## 2015-01-06 LAB — POCT URINALYSIS DIPSTICK
GLUCOSE UA: NEGATIVE
Ketones, UA: NEGATIVE
NITRITE UA: NEGATIVE

## 2015-01-06 LAB — POCT WET PREP (WET MOUNT)
TRICHOMONAS WET PREP HPF POC: POSITIVE
WBC, Wet Prep HPF POC: POSITIVE

## 2015-01-06 MED ORDER — CEPHALEXIN 500 MG PO CAPS: 500.0000 mg | ORAL_CAPSULE | Freq: Four times a day (QID) | ORAL | Status: DC

## 2015-01-06 MED ORDER — METRONIDAZOLE 500 MG PO TABS: ORAL_TABLET | ORAL | Status: DC

## 2015-01-06 NOTE — Progress Notes (Signed)
G1P0 7838w1d Estimated Date of Delivery: 06/08/15    WORK IN APPT Blood pressure 120/60, pulse 80, weight 179 lb 8 oz (81.421 kg), last menstrual period 09/01/2014.   BP weight and urine results all reviewed and noted.3+ blood and 2+ leuks and trace protein  Please refer to the obstetrical flow sheet for the fundal height and fetal heart rate documentation:  Patient reports good fetal movement, denies any bleeding and no rupture of membranes symptoms or regular contractions. Patient is having frequency of urination and low pelvic pressure and vaginal discharge. Pelvic exam shows white frothy discharge,cervix long and closed,round ligament area tender, wet prep, shows +WBC and +trich Will send UA  C&S and GC/CHL Increase water, will Rx keflex 500 mg 1 qid x 7 days for UTI and Rx Flagyl 500 mg #4 4 po now and treated partner Tymaine [place] with flagyl 500 mg #4 4 po now, no alcohol,no sex x 10 days,will do POT 11/7 All questions were answered.  Orders Placed This Encounter  Procedures  . POCT Urinalysis Dipstick  Imression:UTI,trich and round ligament pain in second trimester  Plan:  Continued routine obstetrical care,   Return 11/7 for US and see Dr Emelda FearFerguson, so POT then

## 2015-01-06 NOTE — Addendum Note (Signed)
Addended by: Cyril MourningGRIFFIN, JENNIFER A on: 01/06/2015 05:03 PM   Modules accepted: Orders

## 2015-01-06 NOTE — Telephone Encounter (Signed)
Pt states that she is having pressure at the bottom of her stomach. Pt states that she has this pressure for about three days. Pt states that she has vaginal discharge. Pt denies any bleeding and urinary symptoms. Pt was advised that we would need to see her just to be safe and evaluate her. Pt verbalized understanding and phone call was switched to front office and appointment was given for today.

## 2015-01-06 NOTE — Patient Instructions (Addendum)
Round Ligament Pain The round ligament is a cord of muscle and tissue that helps to support the uterus. It can become a source of pain during pregnancy if it becomes stretched or twisted as the baby grows. The pain usually begins in the second trimester of pregnancy, and it can come and go until the baby is delivered. It is not a serious problem, and it does not cause harm to the baby. Round ligament pain is usually a short, sharp, and pinching pain, but it can also be a dull, lingering, and aching pain. The pain is felt in the lower side of the abdomen or in the groin. It usually starts deep in the groin and moves up to the outside of the hip area. Pain can occur with: A sudden change in position. Rolling over in bed. Coughing or sneezing. Physical activity. HOME CARE INSTRUCTIONS Watch your condition for any changes. Take these steps to help with your pain: When the pain starts, relax. Then try: Sitting down. Flexing your knees up to your abdomen. Lying on your side with one pillow under your abdomen and another pillow between your legs. Sitting in a warm bath for 15-20 minutes or until the pain goes away. Take over-the-counter and prescription medicines only as told by your health care provider. Move slowly when you sit and stand. Avoid long walks if they cause pain. Stop or lessen your physical activities if they cause pain. SEEK MEDICAL CARE IF: Your pain does not go away with treatment. You feel pain in your back that you did not have before. Your medicine is not helping. SEEK IMMEDIATE MEDICAL CARE IF: You develop a fever or chills. You develop uterine contractions. You develop vaginal bleeding. You develop nausea or vomiting. You develop diarrhea. You have pain when you urinate.   This information is not intended to replace advice given to you by your health care provider. Make sure you discuss any questions you have with your health care provider.   Document Released:  12/06/2007 Document Revised: 05/21/2011 Document Reviewed: 05/05/2014 Elsevier Interactive Patient Education 2016 Elsevier Inc. Urinary Tract Infection Urinary tract infections (UTIs) can develop anywhere along your urinary tract. Your urinary tract is your body's drainage system for removing wastes and extra water. Your urinary tract includes two kidneys, two ureters, a bladder, and a urethra. Your kidneys are a pair of bean-shaped organs. Each kidney is about the size of your fist. They are located below your ribs, one on each side of your spine. CAUSES Infections are caused by microbes, which are microscopic organisms, including fungi, viruses, and bacteria. These organisms are so small that they can only be seen through a microscope. Bacteria are the microbes that most commonly cause UTIs. SYMPTOMS  Symptoms of UTIs may vary by age and gender of the patient and by the location of the infection. Symptoms in young women typically include a frequent and intense urge to urinate and a painful, burning feeling in the bladder or urethra during urination. Older women and men are more likely to be tired, shaky, and weak and have muscle aches and abdominal pain. A fever may mean the infection is in your kidneys. Other symptoms of a kidney infection include pain in your back or sides below the ribs, nausea, and vomiting. DIAGNOSIS To diagnose a UTI, your caregiver will ask you about your symptoms. Your caregiver will also ask you to provide a urine sample. The urine sample will be tested for bacteria and white blood cells. White blood  cells are made by your body to help fight infection. TREATMENT  Typically, UTIs can be treated with medication. Because most UTIs are caused by a bacterial infection, they usually can be treated with the use of antibiotics. The choice of antibiotic and length of treatment depend on your symptoms and the type of bacteria causing your infection. HOME CARE INSTRUCTIONS  If you  were prescribed antibiotics, take them exactly as your caregiver instructs you. Finish the medication even if you feel better after you have only taken some of the medication.  Drink enough water and fluids to keep your urine clear or pale yellow.  Avoid caffeine, tea, and carbonated beverages. They tend to irritate your bladder.  Empty your bladder often. Avoid holding urine for long periods of time.  Empty your bladder before and after sexual intercourse.  After a bowel movement, women should cleanse from front to back. Use each tissue only once. SEEK MEDICAL CARE IF:   You have back pain.  You develop a fever.  Your symptoms do not begin to resolve within 3 days. SEEK IMMEDIATE MEDICAL CARE IF:   You have severe back pain or lower abdominal pain.  You develop chills.  You have nausea or vomiting.  You have continued burning or discomfort with urination. MAKE SURE YOU:   Understand these instructions.  Will watch your condition.  Will get help right away if you are not doing well or get worse.   This information is not intended to replace advice given to you by your health care provider. Make sure you discuss any questions you have with your health care provider.   Document Released: 12/06/2004 Document Revised: 11/17/2014 Document Reviewed: 04/06/2011 Elsevier Interactive Patient Education 2016 Elsevier Inc. Bacterial Vaginosis Bacterial vaginosis is a vaginal infection that occurs when the normal balance of bacteria in the vagina is disrupted. It results from an overgrowth of certain bacteria. This is the most common vaginal infection in women of childbearing age. Treatment is important to prevent complications, especially in pregnant women, as it can cause a premature delivery. CAUSES  Bacterial vaginosis is caused by an increase in harmful bacteria that are normally present in smaller amounts in the vagina. Several different kinds of bacteria can cause bacterial  vaginosis. However, the reason that the condition develops is not fully understood. RISK FACTORS Certain activities or behaviors can put you at an increased risk of developing bacterial vaginosis, including:  Having a new sex partner or multiple sex partners.  Douching.  Using an intrauterine device (IUD) for contraception. Women do not get bacterial vaginosis from toilet seats, bedding, swimming pools, or contact with objects around them. SIGNS AND SYMPTOMS  Some women with bacterial vaginosis have no signs or symptoms. Common symptoms include:  Grey vaginal discharge.  A fishlike odor with discharge, especially after sexual intercourse.  Itching or burning of the vagina and vulva.  Burning or pain with urination. DIAGNOSIS  Your health care provider will take a medical history and examine the vagina for signs of bacterial vaginosis. A sample of vaginal fluid may be taken. Your health care provider will look at this sample under a microscope to check for bacteria and abnormal cells. A vaginal pH test may also be done.  TREATMENT  Bacterial vaginosis may be treated with antibiotic medicines. These may be given in the form of a pill or a vaginal cream. A second round of antibiotics may be prescribed if the condition comes back after treatment. Because bacterial vaginosis increases your  risk for sexually transmitted diseases, getting treated can help reduce your risk for chlamydia, gonorrhea, HIV, and herpes. HOME CARE INSTRUCTIONS   Only take over-the-counter or prescription medicines as directed by your health care provider.  If antibiotic medicine was prescribed, take it as directed. Make sure you finish it even if you start to feel better.  Tell all sexual partners that you have a vaginal infection. They should see their health care provider and be treated if they have problems, such as a mild rash or itching.  During treatment, it is important that you follow these  instructions:  Avoid sexual activity or use condoms correctly.  Do not douche.  Avoid alcohol as directed by your health care provider.  Avoid breastfeeding as directed by your health care provider. SEEK MEDICAL CARE IF:   Your symptoms are not improving after 3 days of treatment.  You have increased discharge or pain.  You have a fever. MAKE SURE YOU:   Understand these instructions.  Will watch your condition.  Will get help right away if you are not doing well or get worse. FOR MORE INFORMATION  Centers for Disease Control and Prevention, Division of STD Prevention: SolutionApps.co.zawww.cdc.gov/std American Sexual Health Association (ASHA): www.ashastd.org    This information is not intended to replace advice given to you by your health care provider. Make sure you discuss any questions you have with your health care provider.   Document Released: 02/26/2005 Document Revised: 03/19/2014 Document Reviewed: 10/08/2012 Elsevier Interactive Patient Education 2016 Elsevier Inc. Take flagyl Take keflex and push fluids Return 11/7 No sex or alcohol Trichomoniasis Trichomoniasis is an infection caused by an organism called Trichomonas. The infection can affect both women and men. In women, the outer female genitalia and the vagina are affected. In men, the penis is mainly affected, but the prostate and other reproductive organs can also be involved. Trichomoniasis is a sexually transmitted infection (STI) and is most often passed to another person through sexual contact.  RISK FACTORS  Having unprotected sexual intercourse.  Having sexual intercourse with an infected partner. SIGNS AND SYMPTOMS  Symptoms of trichomoniasis in women include:  Abnormal gray-green frothy vaginal discharge.  Itching and irritation of the vagina.  Itching and irritation of the area outside the vagina. Symptoms of trichomoniasis in men include:   Penile discharge with or without pain.  Pain during  urination. This results from inflammation of the urethra. DIAGNOSIS  Trichomoniasis may be found during a Pap test or physical exam. Your health care provider may use one of the following methods to help diagnose this infection:  Testing the pH of the vagina with a test tape.  Using a vaginal swab test that checks for the Trichomonas organism. A test is available that provides results within a few minutes.  Examining a urine sample.  Testing vaginal secretions. Your health care provider may test you for other STIs, including HIV. TREATMENT   You may be given medicine to fight the infection. Women should inform their health care provider if they could be or are pregnant. Some medicines used to treat the infection should not be taken during pregnancy.  Your health care provider may recommend over-the-counter medicines or creams to decrease itching or irritation.  Your sexual partner will need to be treated if infected.  Your health care provider may test you for infection again 3 months after treatment. HOME CARE INSTRUCTIONS   Take medicines only as directed by your health care provider.  Take over-the-counter medicine for  itching or irritation as directed by your health care provider.  Do not have sexual intercourse while you have the infection.  Women should not douche or wear tampons while they have the infection.  Discuss your infection with your partner. Your partner may have gotten the infection from you, or you may have gotten it from your partner.  Have your sex partner get examined and treated if necessary.  Practice safe, informed, and protected sex.  See your health care provider for other STI testing. SEEK MEDICAL CARE IF:   You still have symptoms after you finish your medicine.  You develop abdominal pain.  You have pain when you urinate.  You have bleeding after sexual intercourse.  You develop a rash.  Your medicine makes you sick or makes you throw up  (vomit). MAKE SURE YOU:  Understand these instructions.  Will watch your condition.  Will get help right away if you are not doing well or get worse.   This information is not intended to replace advice given to you by your health care provider. Make sure you discuss any questions you have with your health care provider.   Document Released: 08/22/2000 Document Revised: 03/19/2014 Document Reviewed: 12/08/2012 Elsevier Interactive Patient Education [employer]

## 2015-01-07 ENCOUNTER — Telehealth: Payer: Self-pay | Admitting: Adult Health

## 2015-01-07 LAB — MICROSCOPIC EXAMINATION: Casts: NONE SEEN /lpf

## 2015-01-07 LAB — URINALYSIS, ROUTINE W REFLEX MICROSCOPIC
BILIRUBIN UA: NEGATIVE
GLUCOSE, UA: NEGATIVE
KETONES UA: NEGATIVE
NITRITE UA: NEGATIVE
SPEC GRAV UA: 1.024 (ref 1.005–1.030)
Urobilinogen, Ur: 1 mg/dL (ref 0.2–1.0)
pH, UA: 6.5 (ref 5.0–7.5)

## 2015-01-07 NOTE — Telephone Encounter (Signed)
Pt feeling a little better, culture and GC/CHL not back yet

## 2015-01-08 LAB — GC/CHLAMYDIA PROBE AMP
Chlamydia trachomatis, NAA: NEGATIVE
Neisseria gonorrhoeae by PCR: NEGATIVE

## 2015-01-08 LAB — URINE CULTURE: ORGANISM ID, BACTERIA: NO GROWTH

## 2015-01-09 ENCOUNTER — Encounter (HOSPITAL_COMMUNITY): Payer: Self-pay | Admitting: Emergency Medicine

## 2015-01-09 ENCOUNTER — Emergency Department (HOSPITAL_COMMUNITY)
Admission: EM | Admit: 2015-01-09 | Discharge: 2015-01-09 | Discharge status: Against Medical Advice | Payer: Medicaid Other | Attending: Emergency Medicine | Admitting: Emergency Medicine

## 2015-01-09 DIAGNOSIS — Z3A19 19 weeks gestation of pregnancy: Secondary | ICD-10-CM | POA: Insufficient documentation

## 2015-01-09 DIAGNOSIS — O9989 Other specified diseases and conditions complicating pregnancy, childbirth and the puerperium: Secondary | ICD-10-CM | POA: Insufficient documentation

## 2015-01-09 DIAGNOSIS — R1032 Left lower quadrant pain: Secondary | ICD-10-CM | POA: Insufficient documentation

## 2015-01-09 DIAGNOSIS — N898 Other specified noninflammatory disorders of vagina: Secondary | ICD-10-CM | POA: Diagnosis not present

## 2015-01-09 DIAGNOSIS — R0789 Other chest pain: Secondary | ICD-10-CM | POA: Insufficient documentation

## 2015-01-09 DIAGNOSIS — J45909 Unspecified asthma, uncomplicated: Secondary | ICD-10-CM | POA: Insufficient documentation

## 2015-01-09 DIAGNOSIS — O99512 Diseases of the respiratory system complicating pregnancy, second trimester: Secondary | ICD-10-CM | POA: Diagnosis not present

## 2015-01-09 NOTE — ED Notes (Addendum)
Pt is [redacted] week pregnant. Pt states she is having LLQ pain that radiates into back. Pt denies any GI/GU complaints, pt does state she has been having "white" vaginal discharge. Pt states that the pain feels like a stabbing pain. Pt denies N/V/, and any spotting.

## 2015-01-14 ENCOUNTER — Other Ambulatory Visit: Payer: Self-pay | Admitting: Obstetrics and Gynecology

## 2015-01-14 DIAGNOSIS — Z1389 Encounter for screening for other disorder: Secondary | ICD-10-CM

## 2015-01-17 ENCOUNTER — Encounter: Payer: Self-pay | Admitting: Obstetrics and Gynecology

## 2015-01-17 ENCOUNTER — Ambulatory Visit (INDEPENDENT_AMBULATORY_CARE_PROVIDER_SITE_OTHER): Payer: Medicaid Other

## 2015-01-17 ENCOUNTER — Ambulatory Visit (INDEPENDENT_AMBULATORY_CARE_PROVIDER_SITE_OTHER): Payer: Medicaid Other | Admitting: Obstetrics and Gynecology

## 2015-01-17 VITALS — BP 120/78 | HR 80 | Wt 177.5 lb

## 2015-01-17 DIAGNOSIS — Z1389 Encounter for screening for other disorder: Secondary | ICD-10-CM

## 2015-01-17 DIAGNOSIS — Z3402 Encounter for supervision of normal first pregnancy, second trimester: Secondary | ICD-10-CM

## 2015-01-17 DIAGNOSIS — Z3A19 19 weeks gestation of pregnancy: Secondary | ICD-10-CM

## 2015-01-17 DIAGNOSIS — Z331 Pregnant state, incidental: Secondary | ICD-10-CM

## 2015-01-17 DIAGNOSIS — O358XX1 Maternal care for other (suspected) fetal abnormality and damage, fetus 1: Secondary | ICD-10-CM

## 2015-01-17 DIAGNOSIS — Z36 Encounter for antenatal screening of mother: Secondary | ICD-10-CM | POA: Diagnosis not present

## 2015-01-17 LAB — POCT URINALYSIS DIPSTICK
Glucose, UA: NEGATIVE
Ketones, UA: NEGATIVE
NITRITE UA: NEGATIVE
PROTEIN UA: NEGATIVE
RBC UA: 2

## 2015-01-17 NOTE — Progress Notes (Signed)
US 19+5wks,post pl gr 0,cx 3.1cm,normal ov's bilat,LV EICF 2.353mm,AFI 3.8cm,fhr 158 bpm,limited view of heart please have pt come back for additional images,efw 298g

## 2015-01-17 NOTE — Progress Notes (Signed)
Patient ID: Natasha Bean, female   DOB: 02/27/1986, 29 y.o.   MRN: [id number] .G1P0 6219w5d Estimated Date of Delivery: 06/08/15  Blood pressure 120/78, pulse 80, weight 177 lb 8 oz (80.513 kg), last menstrual period 09/01/2014.   refer to the ob flow sheet for FH and FHR, also BP, Wt, Urine results:notable for Leukocytes (small (1+) (A))   Patient reports  + good fetal movement, denies any bleeding and no rupture of membranes symptoms or regular contractions. Patient complaints: No new complaints today. However, pt reports frequency of urination with associated low pelvic pressure and vaginal discharge.  Pelvic exam shows Cervix normal appearing, closed, with normal secretions.   Questions were answered. Assessment:  1. Cervix normal appearing, closed, with normal secretions.  2. Wet prep negative for Trichomonas.    Plan:   1. Continued routine obstetrical care 2. F/u in 4 weeks for continued obstetrical care.    By signing my name below, I, Marica Otterusrat Rahman, attest that this documentation has been prepared under the direction and in the presence of Christin BachJohn Maddox Hlavaty, MD. Electronically Signed: Marica OtterNusrat Rahman, ED Scribe. 01/17/2015. 10:34 AM.   I personally performed the services described in this documentation, which was SCRIBED in my presence. The recorded information has been reviewed and considered accurate. It has been edited as necessary during review. Tilda BurrowFERGUSON,Jakerra Floyd V, MD

## 2015-01-17 NOTE — Progress Notes (Signed)
Pt denies any problems or concerns at this time.  

## 2015-02-14 ENCOUNTER — Ambulatory Visit (INDEPENDENT_AMBULATORY_CARE_PROVIDER_SITE_OTHER): Payer: Medicaid Other | Admitting: Women's Health

## 2015-02-14 ENCOUNTER — Encounter: Payer: Self-pay | Admitting: Women's Health

## 2015-02-14 VITALS — BP 120/72 | HR 70 | Wt 185.0 lb

## 2015-02-14 DIAGNOSIS — Z331 Pregnant state, incidental: Secondary | ICD-10-CM

## 2015-02-14 DIAGNOSIS — Z3A19 19 weeks gestation of pregnancy: Secondary | ICD-10-CM

## 2015-02-14 DIAGNOSIS — Z3402 Encounter for supervision of normal first pregnancy, second trimester: Secondary | ICD-10-CM

## 2015-02-14 DIAGNOSIS — Z23 Encounter for immunization: Secondary | ICD-10-CM | POA: Diagnosis not present

## 2015-02-14 DIAGNOSIS — Z1389 Encounter for screening for other disorder: Secondary | ICD-10-CM

## 2015-02-14 DIAGNOSIS — O23591 Infection of other part of genital tract in pregnancy, first trimester: Secondary | ICD-10-CM

## 2015-02-14 DIAGNOSIS — O99019 Anemia complicating pregnancy, unspecified trimester: Secondary | ICD-10-CM

## 2015-02-14 DIAGNOSIS — O283 Abnormal ultrasonic finding on antenatal screening of mother: Secondary | ICD-10-CM

## 2015-02-14 DIAGNOSIS — F1290 Cannabis use, unspecified, uncomplicated: Secondary | ICD-10-CM | POA: Insufficient documentation

## 2015-02-14 DIAGNOSIS — F1210 Cannabis abuse, uncomplicated: Secondary | ICD-10-CM

## 2015-02-14 DIAGNOSIS — A5901 Trichomonal vulvovaginitis: Secondary | ICD-10-CM

## 2015-02-14 LAB — POCT URINALYSIS DIPSTICK
Blood, UA: 3
GLUCOSE UA: NEGATIVE
KETONES UA: NEGATIVE
Leukocytes, UA: NEGATIVE
Nitrite, UA: NEGATIVE
Protein, UA: NEGATIVE

## 2015-02-14 MED ORDER — FERROUS SULFATE 325 (65 FE) MG PO TABS: 325.0000 mg | ORAL_TABLET | Freq: Two times a day (BID) | ORAL | Status: DC

## 2015-02-14 NOTE — Progress Notes (Signed)
Low-risk OB appointment G1P0 [id number] Estimated Date of Delivery: 06/08/15 BP 120/72 mmHg  Pulse 70  Wt 185 lb (83.915 kg)  LMP 09/01/2014  BP, weight, and urine reviewed.  Refer to obstetrical flow sheet for FH & FHR.  Reports good fm.  Denies regular uc's, lof, vb, or uti s/s. No complaints. Trich noted in sticky note, no wet prep in results for poc- pt states she had one done last visit and was neg/verified in J[facility]'s note. Hgb 9.5 at last check, not on Fe- so Fe BID rx'd, to increase foods high in Fe. Offered flu shot, would like today.  Reviewed ptl s/s, fm. Plan:  Continue routine obstetrical care  F/U in 4wks for OB appointment, pn2, f/u u/s for Lt EICF and limited views OFTs Flu shot today

## 2015-02-14 NOTE — Patient Instructions (Addendum)
You will have your sugar test next visit.  Please do not eat or drink anything after midnight the night before you come, not even water.  You will be here for at least two hours.     Call the office ([phone]) or go to [facility] if:  You begin to have strong, frequent contractions  Your water breaks.  Sometimes it is a big gush of fluid, sometimes it is just a trickle that keeps getting your panties wet or running down your legs  You have vaginal bleeding.  It is normal to have a small amount of spotting if your cervix was checked.   You don't feel your baby moving like normal.  If you don't, get you something to eat and drink and lay down and focus on feeling your baby move.   If your baby is still not moving like normal, you should call the office or go to [facility].  [place] Pediatricians/Family Doctors:  [facility] [phone]            [facility] [phone]                 [facility] [phone] (usually not accepting new patients unless you have family there already, you are always welcome to call and ask)            [facility] ([address] [place]) [phone]   [place] Pediatricians/Family Doctors:   [facility]: [phone]  [facility]: [phone]    Second Trimester of Pregnancy The second trimester is from week 13 through week 28, months 4 through 6. The second trimester is often a time when you feel your best. Your body has also adjusted to being pregnant, and you begin to feel better physically. Usually, morning sickness has lessened or quit completely, you may have more energy, and you may have an increase in appetite. The second trimester is also a time when the fetus is growing rapidly. At the end of the sixth month, the fetus is about 9 inches long and weighs about 1 pounds. You will likely begin to feel the baby move (quickening) between 18 and 20  weeks of the pregnancy. BODY CHANGES Your body goes through many changes during pregnancy. The changes vary from woman to woman.  6. Your weight will continue to increase. You will notice your lower abdomen bulging out. 7. You may begin to get stretch marks on your hips, abdomen, and breasts. 8. You may develop headaches that can be relieved by medicines approved by your health care provider. 9. You may urinate more often because the fetus is pressing on your bladder. 10. You may develop or continue to have heartburn as a result of your pregnancy. 11. You may develop constipation because certain hormones are causing the muscles that push waste through your intestines to slow down. 12. You may develop hemorrhoids or swollen, bulging veins (varicose veins). 13. You may have back pain because of the weight gain and pregnancy hormones relaxing your joints between the bones in your pelvis and as a result of a shift in weight and the muscles that support your balance. 14. Your breasts will continue to grow and be tender. 15. Your gums may bleed and may be sensitive to brushing and flossing. 16. Dark spots or blotches (chloasma, mask of pregnancy) may develop on your face. This will likely fade after the baby is born. 17. A dark line from your belly button to the pubic   area (linea nigra) may appear. This will likely fade after the baby is born. 18. You may have changes in your hair. These can include thickening of your hair, rapid growth, and changes in texture. Some women also have hair loss during or after pregnancy, or hair that feels dry or thin. Your hair will most likely return to normal after your baby is born. WHAT TO EXPECT AT YOUR PRENATAL VISITS During a routine prenatal visit: 2. You will be weighed to make sure you and the fetus are growing normally. 3. Your blood pressure will be taken. 4. Your abdomen will be measured to track your baby's growth. 5. The fetal heartbeat will be listened  to. 6. Any test results from the previous visit will be discussed. Your health care provider may ask you: 2. How you are feeling. 3. If you are feeling the baby move. 4. If you have had any abnormal symptoms, such as leaking fluid, bleeding, severe headaches, or abdominal cramping. 5. If you have any questions. Other tests that may be performed during your second trimester include: 2. Blood tests that check for: 1. Low iron levels (anemia). 2. Gestational diabetes (between 24 and 28 weeks). 3. Rh antibodies. 3. Urine tests to check for infections, diabetes, or protein in the urine. 4. An ultrasound to confirm the proper growth and development of the baby. 5. An amniocentesis to check for possible genetic problems. 6. Fetal screens for spina bifida and Down syndrome. HOME CARE INSTRUCTIONS  3. Avoid all smoking, herbs, alcohol, and unprescribed drugs. These chemicals affect the formation and growth of the baby. 4. Follow your health care provider's instructions regarding medicine use. There are medicines that are either safe or unsafe to take during pregnancy. 5. Exercise only as directed by your health care provider. Experiencing uterine cramps is a good sign to stop exercising. 6. Continue to eat regular, healthy meals. 7. Wear a good support bra for breast tenderness. 8. Do not use hot tubs, steam rooms, or saunas. 9. Wear your seat belt at all times when driving. 10. Avoid raw meat, uncooked cheese, cat litter boxes, and soil used by cats. These carry germs that can cause birth defects in the baby. 11. Take your prenatal vitamins. 12. Try taking a stool softener (if your health care provider approves) if you develop constipation. Eat more high-fiber foods, such as fresh vegetables or fruit and whole grains. Drink plenty of fluids to keep your urine clear or pale yellow. 13. Take warm sitz baths to soothe any pain or discomfort caused by hemorrhoids. Use hemorrhoid cream if your health  care provider approves. 14. If you develop varicose veins, wear support hose. Elevate your feet for 15 minutes, 3-4 times a day. Limit salt in your diet. 15. Avoid heavy lifting, wear low heel shoes, and practice good posture. 16. Rest with your legs elevated if you have leg cramps or low back pain. 17. Visit your dentist if you have not gone yet during your pregnancy. Use a soft toothbrush to brush your teeth and be gentle when you floss. 18. A sexual relationship may be continued unless your health care provider directs you otherwise. 19. Continue to go to all your prenatal visits as directed by your health care provider. SEEK MEDICAL CARE IF:   You have dizziness.  You have mild pelvic cramps, pelvic pressure, or nagging pain in the abdominal area.  You have persistent nausea, vomiting, or diarrhea.  You have a bad smelling vaginal discharge.  You have   pain with urination. SEEK IMMEDIATE MEDICAL CARE IF:   You have a fever.  You are leaking fluid from your vagina.  You have spotting or bleeding from your vagina.  You have severe abdominal cramping or pain.  You have rapid weight gain or loss.  You have shortness of breath with chest pain.  You notice sudden or extreme swelling of your face, hands, ankles, feet, or legs.  You have not felt your baby move in over an hour.  You have severe headaches that do not go away with medicine.  You have vision changes. Document Released: 02/20/2001 Document Revised: 03/03/2013 Document Reviewed: 04/29/2012 [facility] Patient Information 2015 [place], [place]. This information is not intended to replace advice given to you by your health care provider. Make sure you discuss any questions you have with your health care provider.      Iron-Rich Diet Iron is a mineral that helps your body to produce hemoglobin. Hemoglobin is a protein in your red blood cells that carries oxygen to your body's tissues. Eating too little iron may cause you  to feel weak and tired, and it can increase your risk for infection. Eating enough iron is necessary for your body's metabolism, muscle function, and nervous system. Iron is naturally found in many foods. It can also be added to foods or fortified in foods. There are two types of dietary iron: 19. Heme iron. Heme iron is absorbed by the body more easily than nonheme iron. Heme iron is found in meat, poultry, and fish. 20. Nonheme iron. Nonheme iron is found in dietary supplements, iron-fortified grains, beans, and vegetables. You may need to follow an iron-rich diet if: 7. You have been diagnosed with iron deficiency or iron-deficiency anemia. 8. You have a condition that prevents you from absorbing dietary iron, such as: 1. Infection in your intestines. 2. Celiac disease. This involves long-lasting (chronic) inflammation of your intestines. 9. You do not eat enough iron. 10. You eat a diet that is high in foods that impair iron absorption. 11. You have lost a lot of blood. 12. You have heavy bleeding during your menstrual cycle. 69. You are pregnant. WHAT IS MY PLAN? Your health care provider may help you to determine how much iron you need per day based on your condition. Generally, when a person consumes sufficient amounts of iron in the diet, the following iron needs are met: 6. Men. 84. 80-63 years old: 11 mg per day. 80. 45-77 years old: 8 mg per day. 7. Women.  18. 81-73 years old: 15 mg per day. 75. 16-66 years old: 18 mg per day. 16. Over 22 years old: 8 mg per day. 4. Pregnant women: 27 mg per day. 5. Breastfeeding women: 9 mg per day. WHAT DO I NEED TO KNOW ABOUT AN IRON-RICH DIET? 7. Eat fresh fruits and vegetables that are high in vitamin C along with foods that are high in iron. This will help increase the amount of iron that your body absorbs from food, especially with foods containing nonheme iron. Foods that are high in vitamin C include oranges, peppers, tomatoes, and  mango. 8. Take iron supplements only as directed by your health care provider. Overdose of iron can be life-threatening. If you were prescribed iron supplements, take them with orange juice or a vitamin C supplement. 9. Cook foods in pots and pans that are made from iron.  10. Eat nonheme iron-containing foods alongside foods that are high in heme iron. This helps to improve your  iron absorption.  11. Certain foods and drinks contain compounds that impair iron absorption. Avoid eating these foods in the same meal as iron-rich foods or with iron supplements. These include: 1. Coffee, black tea, and red wine. 2. Milk, dairy products, and foods that are high in calcium. 3. Beans, soybeans, and peas. 4. Whole grains. 12. When eating foods that contain both nonheme iron and compounds that impair iron absorption, follow these tips to absorb iron better.  1. Soak beans overnight before cooking. 2. Soak whole grains overnight and drain them before using. 3. Ferment flours before baking, such as using yeast in bread dough. WHAT FOODS CAN I EAT? Grains Iron-fortified breakfast cereal. Iron-fortified whole-wheat bread. Enriched rice. Sprouted grains. Vegetables Spinach. Potatoes with skin. Green peas. Broccoli. Red and green bell peppers. Fermented vegetables. Fruits Prunes. Raisins. Oranges. Strawberries. Mango. Grapefruit. Meats and Other Protein Sources Beef liver. Oysters. Beef. Shrimp. [place]. Chicken. [place]. Sardines. Chickpeas. Nuts. Tofu. Beverages Tomato juice. Fresh orange juice. Prune juice. Hibiscus tea. Fortified instant breakfast shakes. Condiments Tahini. Fermented soy sauce. Sweets and Desserts Black-strap molasses.  Other Wheat germ. The items listed above may not be a complete list of recommended foods or beverages. Contact your dietitian for more options. WHAT FOODS ARE NOT RECOMMENDED? Grains Whole grains. Bran cereal. Bran flour. Oats. Vegetables Artichokes.  Brussels sprouts. Kale. Fruits Blueberries. Raspberries. Strawberries. Figs. Meats and Other Protein Sources Soybeans. Products made from soy protein. Dairy Milk. Cream. Cheese. Yogurt. Cottage cheese. Beverages Coffee. Black tea. Red wine. Sweets and Desserts Cocoa. Chocolate. Ice cream. Other Basil. Oregano. Parsley. The items listed above may not be a complete list of foods and beverages to avoid. Contact your dietitian for more information.   This information is not intended to replace advice given to you by your health care provider. Make sure you discuss any questions you have with your health care provider.   Document Released: 10/10/2004 Document Revised: 03/19/2014 Document Reviewed: 09/23/2013 Elsevier Interactive Patient Education [employer].

## 2015-02-15 ENCOUNTER — Telehealth: Payer: Self-pay | Admitting: Women's Health

## 2015-02-15 ENCOUNTER — Telehealth: Payer: Self-pay | Admitting: *Deleted

## 2015-02-15 NOTE — Telephone Encounter (Signed)
Pt informed her Iron Rx is covered by [employer]. Pt informed will contact her pharmacy and follow up with them. Pt verbalized understanding.

## 2015-02-16 ENCOUNTER — Telehealth: Payer: Self-pay | Admitting: *Deleted

## 2015-02-16 NOTE — Telephone Encounter (Signed)
Pt informed per [facility] pharmacy states [facility] no longer covers OTC iron. Will need to pay out of pocket, cost $11.99. Pt verbalized understanding.

## 2015-03-08 ENCOUNTER — Encounter (HOSPITAL_COMMUNITY): Payer: Self-pay

## 2015-03-08 ENCOUNTER — Inpatient Hospital Stay (HOSPITAL_COMMUNITY)
Admission: AD | Admit: 2015-03-08 | Discharge: 2015-03-09 | Disposition: A | Discharge status: Other Acute Inpatient Hospital | Payer: Medicaid Other | Source: Ambulatory Visit | Attending: Family Medicine | Admitting: Family Medicine

## 2015-03-08 DIAGNOSIS — O99512 Diseases of the respiratory system complicating pregnancy, second trimester: Secondary | ICD-10-CM | POA: Diagnosis not present

## 2015-03-08 DIAGNOSIS — O4702 False labor before 37 completed weeks of gestation, second trimester: Secondary | ICD-10-CM | POA: Diagnosis not present

## 2015-03-08 DIAGNOSIS — Z8619 Personal history of other infectious and parasitic diseases: Secondary | ICD-10-CM | POA: Diagnosis not present

## 2015-03-08 DIAGNOSIS — Z8679 Personal history of other diseases of the circulatory system: Secondary | ICD-10-CM | POA: Insufficient documentation

## 2015-03-08 DIAGNOSIS — Z79899 Other long term (current) drug therapy: Secondary | ICD-10-CM | POA: Diagnosis not present

## 2015-03-08 DIAGNOSIS — O99012 Anemia complicating pregnancy, second trimester: Secondary | ICD-10-CM | POA: Insufficient documentation

## 2015-03-08 DIAGNOSIS — O2340 Unspecified infection of urinary tract in pregnancy, unspecified trimester: Secondary | ICD-10-CM | POA: Diagnosis not present

## 2015-03-08 DIAGNOSIS — J45909 Unspecified asthma, uncomplicated: Secondary | ICD-10-CM | POA: Insufficient documentation

## 2015-03-08 DIAGNOSIS — Z87891 Personal history of nicotine dependence: Secondary | ICD-10-CM | POA: Diagnosis not present

## 2015-03-08 DIAGNOSIS — Z3490 Encounter for supervision of normal pregnancy, unspecified, unspecified trimester: Secondary | ICD-10-CM

## 2015-03-08 DIAGNOSIS — O9989 Other specified diseases and conditions complicating pregnancy, childbirth and the puerperium: Secondary | ICD-10-CM | POA: Diagnosis present

## 2015-03-08 DIAGNOSIS — Z3A27 27 weeks gestation of pregnancy: Secondary | ICD-10-CM | POA: Diagnosis not present

## 2015-03-08 DIAGNOSIS — D649 Anemia, unspecified: Secondary | ICD-10-CM | POA: Insufficient documentation

## 2015-03-08 LAB — BASIC METABOLIC PANEL
ANION GAP: 7 (ref 5–15)
BUN: 12 mg/dL (ref 6–20)
CALCIUM: 9 mg/dL (ref 8.9–10.3)
CO2: 21 mmol/L — ABNORMAL LOW (ref 22–32)
CREATININE: 0.59 mg/dL (ref 0.44–1.00)
Chloride: 107 mmol/L (ref 101–111)
GFR calc Af Amer: >60 mL/min (ref >60)
GLUCOSE: 89 mg/dL (ref 65–99)
Potassium: 3.8 mmol/L (ref 3.5–5.1)
Sodium: 135 mmol/L (ref 135–145)

## 2015-03-08 LAB — CBC WITH DIFFERENTIAL/PLATELET
BASOS PCT: 0 %
Basophils Absolute: 0 10*3/uL (ref 0.0–0.1)
Eosinophils Absolute: 0.2 10*3/uL (ref 0.0–0.7)
Eosinophils Relative: 2 %
HEMATOCRIT: 26.7 % — AB (ref 36.0–46.0)
HEMOGLOBIN: 9.2 g/dL — AB (ref 12.0–15.0)
LYMPHS ABS: 2.5 10*3/uL (ref 0.7–4.0)
Lymphocytes Relative: 28 %
MCH: 31.2 pg (ref 26.0–34.0)
MCHC: 34.5 g/dL (ref 30.0–36.0)
MCV: 90.5 fL (ref 78.0–100.0)
MONOS PCT: 7 %
Monocytes Absolute: 0.6 10*3/uL (ref 0.1–1.0)
NEUTROS ABS: 5.5 10*3/uL (ref 1.7–7.7)
NEUTROS PCT: 63 %
PLATELETS: 191 10*3/uL (ref 150–400)
RBC: 2.95 MIL/uL — AB (ref 3.87–5.11)
RDW: 13.1 % (ref 11.5–15.5)
WBC: 8.8 10*3/uL (ref 4.0–10.5)

## 2015-03-08 LAB — URINE MICROSCOPIC-ADD ON

## 2015-03-08 LAB — URINALYSIS, ROUTINE W REFLEX MICROSCOPIC
Bilirubin Urine: NEGATIVE
Glucose, UA: NEGATIVE mg/dL
Ketones, ur: NEGATIVE mg/dL
Leukocytes, UA: NEGATIVE
NITRITE: NEGATIVE
PH: 5.5 (ref 5.0–8.0)
Protein, ur: NEGATIVE mg/dL
SPECIFIC GRAVITY, URINE: 1.025 (ref 1.005–1.030)

## 2015-03-08 MED ORDER — SODIUM CHLORIDE 0.9 % IV BOLUS (SEPSIS)
1000.0000 mL | Freq: Once | INTRAVENOUS | Status: AC
  Administered 2015-03-08: 1000 mL via INTRAVENOUS

## 2015-03-08 MED ORDER — ONDANSETRON HCL 4 MG/2ML IJ SOLN
4.0000 mg | Freq: Once | INTRAMUSCULAR | Status: AC
  Administered 2015-03-08: 4 mg via INTRAVENOUS
  Filled 2015-03-08: qty 2

## 2015-03-08 MED ORDER — SODIUM CHLORIDE 0.9 % IV SOLN: Freq: Once | INTRAVENOUS | Status: DC

## 2015-03-08 NOTE — ED Notes (Signed)
Having pain in my abdomen, pelvis, and lower back. Denies water breaking. Dr. Emelda FearFerguson is my doctor. Last seen by him on December 5th per pt.

## 2015-03-08 NOTE — ED Provider Notes (Signed)
CSN: [id number]     Arrival date & time 03/08/15  2036 History  By signing my name below, I, Natasha Bean, attest that this documentation has been prepared under the direction and in the presence of Rolland PorterMark Nikolas Casher, MD. Electronically Signed: Angelene GiovanniEmmanuella Bean, ED Scribe. 03/08/2015. 9:36 PM.    No chief complaint on file.  The history is provided by the patient. No language interpreter was used.   HPI Comments: Natasha Bean is a 29 y.o. female who is [redacted] weeks pregnant who presents to the Emergency Department complaining of gradually worsening constant lower back pain, left sided abdominal pain, and vaginal pain onset 3 days ago. She reports associated nausea and gait problems secondary to pain. She denies that the pain comes in waves. No alleviating factors noted. She states that he has gained approx. 15-18 pounds over the course of the pregnancy and this is her first pregnancy. She denies any vaginal discharge, vaginal bleeding, or vomiting.   OB: Dr. Emelda FearFerguson    Past Medical History  Diagnosis Date  . Asthma   . Anemia   . Migraine   . Vaginal discharge during pregnancy in second trimester 01/06/2015  . Trichimoniasis 01/06/2015  . Round ligament pain 01/06/2015  . Hematuria 01/06/2015  . Urinary frequency 01/06/2015   History reviewed. No pertinent past surgical history. Family History  Problem Relation Age of Onset  . Cancer Mother     throat  . Hypertension Father   . Diabetes Father   . Cancer Father     colon  . Heart disease Paternal Grandmother   . Hypertension Paternal Grandmother   . Diabetes Paternal Grandmother    Social History  Substance Use Topics  . Smoking status: Former Smoker -- 0.50 packs/day for 10 years    Types: Cigarettes  . Smokeless tobacco: Never Used  . Alcohol Use: No   OB History    Gravida Para Term Preterm AB TAB SAB Ectopic Multiple Living   1         0     Review of Systems  Constitutional: Negative for fever, chills,  diaphoresis, appetite change and fatigue.  HENT: Negative for mouth sores, sore throat and trouble swallowing.   Eyes: Negative for visual disturbance.  Respiratory: Negative for cough, chest tightness, shortness of breath and wheezing.   Cardiovascular: Negative for chest pain.  Gastrointestinal: Positive for abdominal pain. Negative for nausea, vomiting, diarrhea and abdominal distention.  Endocrine: Negative for polydipsia, polyphagia and polyuria.  Genitourinary: Positive for vaginal pain. Negative for dysuria, frequency, hematuria, vaginal bleeding and vaginal discharge.  Musculoskeletal: Positive for back pain. Negative for gait problem.  Skin: Negative for color change, pallor and rash.  Neurological: Negative for dizziness, syncope, light-headedness and headaches.  Hematological: Does not bruise/bleed easily.  Psychiatric/Behavioral: Negative for behavioral problems and confusion.      Allergies  Benadryl and Fioricet  Home Medications   Prior to Admission medications   Medication Sig Start Date End Date Taking? Authorizing Provider  acetaminophen (TYLENOL) 500 MG tablet Take 1,000 mg by mouth every 6 (six) hours as needed. pain    Historical Provider, MD  ferrous sulfate 325 (65 FE) MG tablet Take 1 tablet (325 mg total) by mouth 2 (two) times daily with a meal. 02/14/15   Cheral MarkerKimberly R Booker, CNM  flintstones complete (FLINTSTONES) 60 MG chewable tablet Chew 2 tablets by mouth daily.    Historical Provider, MD  pantoprazole (PROTONIX) 20 MG tablet Take 1 tablet (20 mg  total) by mouth daily. 01/03/15   Cheral Marker, CNM   BP 141/88 mmHg  Pulse 78  Temp(Src) 98.2 F (36.8 C) (Oral)  Resp 14  Ht  (1.575 m)  Wt 186 lb (84.369 kg)  BMI 34.01 kg/m2  SpO2 96%  LMP 09/01/2014 Physical Exam  Constitutional: She is oriented to person, place, and time. She appears well-developed and well-nourished. No distress.  HENT:  Head: Normocephalic.  Eyes: Conjunctivae are  normal. Pupils are equal, round, and reactive to light. No scleral icterus.  Neck: Normal range of motion. Neck supple. No thyromegaly present.  Cardiovascular: Normal rate and regular rhythm.  Exam reveals no gallop and no friction rub.   No murmur heard. Pulmonary/Chest: Effort normal and breath sounds normal. No respiratory distress. She has no wheezes. She has no rales.  Abdominal: Soft. Bowel sounds are normal. She exhibits no distension. There is tenderness. There is no rebound.  Gravid abdomen, no peritoneal irritation, LLQ greater than RLQ.    Musculoskeletal: Normal range of motion. She exhibits tenderness.  TTP both SI joints posteriorly   Neurological: She is alert and oriented to person, place, and time.  Skin: Skin is warm and dry. No rash noted.  Psychiatric: She has a normal mood and affect. Her behavior is normal.    ED Course  Procedures (including critical care time) DIAGNOSTIC STUDIES: Oxygen Saturation is 96% on RA, adequate by my interpretation.    COORDINATION OF CARE: 9:30 PM- Pt advised of plan for treatment and pt agrees. Will monitor pt.    Labs Review Labs Reviewed  CBC WITH DIFFERENTIAL/PLATELET  BASIC METABOLIC PANEL  URINALYSIS, ROUTINE W REFLEX MICROSCOPIC (NOT AT [facility])    Imaging Review No results found.   Rolland Porter, MD has personally reviewed and evaluated these images and lab results as part of his medical decision-making.   EKG Interpretation None      MDM   Final diagnoses:  Pregnancy   Pt contracting, per toco.  Per Dr.Denny, at this hospital/[facility]. Requests transfer. Patient stable for transfer at this time. No impending delivery.   I personally performed the services described in this documentation, which was scribed in my presence. The recorded information has been reviewed and is accurate.   Rolland Porter, MD 03/08/15 2215

## 2015-03-08 NOTE — ED Notes (Signed)
I am having a lot of pain. Started a couple of days ago and it is now getting worse. She is not moving like she normally does per pt.

## 2015-03-08 NOTE — Progress Notes (Signed)
MAU charge called with report from [facility] about patient and eminent arrival when [facility] for EMS arrives

## 2015-03-08 NOTE — ED Notes (Signed)
[facility] here to transport pt,  

## 2015-03-08 NOTE — Progress Notes (Signed)
Dr Shawnie Ponspratt notified of patient status and orders given for [facility] to [facility] at this time

## 2015-03-08 NOTE — Progress Notes (Signed)
[facility] called about patient's arrival to [facility] with complaints of "abdominal pain x2 days and decreased fetal movement throughout the day"; patient is a G1P0 at 26 6/[redacted] weeks along in her pregnancy; [facility]; she stated to nurse that pain started a few days ago but has progressively gotten worse throughout the day; EFM applied and [facility] is assessing; patient denies bleeding or leaking of fluid at this time; EFM applied and assessing

## 2015-03-08 NOTE — ED Notes (Signed)
Pt c/o lower abd pain that started a few days ago with nausea, states that the pain has gotten worse today,

## 2015-03-08 NOTE — ED Notes (Signed)
Report given to [facility],  

## 2015-03-08 NOTE — ED Notes (Signed)
[facility] gave verbal [facility], transfer form signed by father,

## 2015-03-08 NOTE — ED Notes (Signed)
Spoke with Neysa Bonitohristy, RN rapid response nurse at [facility] about pt condition.

## 2015-03-09 ENCOUNTER — Encounter (HOSPITAL_COMMUNITY): Payer: Self-pay

## 2015-03-09 DIAGNOSIS — O4702 False labor before 37 completed weeks of gestation, second trimester: Secondary | ICD-10-CM

## 2015-03-09 LAB — FETAL FIBRONECTIN: FETAL FIBRONECTIN: NEGATIVE

## 2015-03-09 MED ORDER — NIFEDIPINE 10 MG PO CAPS
10.0000 mg | ORAL_CAPSULE | Freq: Once | ORAL | Status: AC
  Administered 2015-03-09: 10 mg via ORAL
  Filled 2015-03-09: qty 1

## 2015-03-09 NOTE — MAU Note (Signed)
Pt c/o abdominal pain and pelvic pain since Christmas-worse when changing positions, moving, etc. Denies LOF or vag bleeding. +FM.

## 2015-03-09 NOTE — Discharge Instructions (Signed)
Braxton Hicks Contractions °Contractions of the uterus can occur throughout pregnancy. Contractions are not always a sign that you are in labor.  °WHAT ARE BRAXTON HICKS CONTRACTIONS?  °Contractions that occur before labor are called Braxton Hicks contractions, or false labor. Toward the end of pregnancy (32-34 weeks), these contractions can develop more often and may become more forceful. This is not true labor because these contractions do not result in opening (dilatation) and thinning of the cervix. They are sometimes difficult to tell apart from true labor because these contractions can be forceful and people have different pain tolerances. You should not feel embarrassed if you go to the hospital with false labor. Sometimes, the only way to tell if you are in true labor is for your health care provider to look for changes in the cervix. °If there are no prenatal problems or other health problems associated with the pregnancy, it is completely safe to be sent home with false labor and await the onset of true labor. °HOW CAN YOU TELL THE DIFFERENCE BETWEEN TRUE AND FALSE LABOR? °False Labor °· The contractions of false labor are usually shorter and not as hard as those of true labor.   °· The contractions are usually irregular.   °· The contractions are often felt in the front of the lower abdomen and in the groin.   °· The contractions may go away when you walk around or change positions while lying down.   °· The contractions get weaker and are shorter lasting as time goes on.   °· The contractions do not usually become progressively stronger, regular, and closer together as with true labor.   °True Labor °· Contractions in true labor last 30-70 seconds, become very regular, usually become more intense, and increase in frequency.   °· The contractions do not go away with walking.   °· The discomfort is usually felt in the top of the uterus and spreads to the lower abdomen and low back.   °· True labor can be  determined by your health care provider with an exam. This will show that the cervix is dilating and getting thinner.   °WHAT TO REMEMBER °· Keep up with your usual exercises and follow other instructions given by your health care provider.   °· Take medicines as directed by your health care provider.   °· Keep your regular prenatal appointments.   °· Eat and drink lightly if you think you are going into labor.   °· If Braxton Hicks contractions are making you uncomfortable:   °¨ Change your position from lying down or resting to walking, or from walking to resting.   °¨ Sit and rest in a tub of warm water.   °¨ Drink 2-3 glasses of water. Dehydration may cause these contractions.   °¨ Do slow and deep breathing several times an hour.   °WHEN SHOULD I SEEK IMMEDIATE MEDICAL CARE? °Seek immediate medical care if: °· Your contractions become stronger, more regular, and closer together.   °· You have fluid leaking or gushing from your vagina.   °· You have a fever.   °· You pass blood-tinged mucus.   °· You have vaginal bleeding.   °· You have continuous abdominal pain.   °· You have low back pain that you never had before.   °· You feel your baby's head pushing down and causing pelvic pressure.   °· Your baby is not moving as much as it used to.   °  °This information is not intended to replace advice given to you by your health care provider. Make sure you discuss any questions you have with your health care   provider. °  °Document Released: 02/26/2005 Document Revised: 03/03/2013 Document Reviewed: 12/08/2012 °Elsevier Interactive Patient Education ©2016 [employer] ° °Preterm Labor Information °Preterm labor is when labor starts at less than 37 weeks of pregnancy. The normal length of a pregnancy is 39 to 41 weeks. °CAUSES °Often, there is no identifiable underlying cause as to why a woman goes into preterm labor. One of the most common known causes of preterm labor is infection. Infections of the uterus, cervix,  vagina, amniotic sac, bladder, kidney, or even the lungs (pneumonia) can cause labor to start. Other suspected causes of preterm labor include:  °· Urogenital infections, such as yeast infections and bacterial vaginosis.   °· Uterine abnormalities (uterine shape, uterine septum, fibroids, or bleeding from the placenta).   °· A cervix that has been operated on (it may fail to stay closed).   °· Malformations in the fetus.   °· Multiple gestations (twins, triplets, and so on).   °· Breakage of the amniotic sac.   °RISK FACTORS °· Having a previous history of preterm labor.   °· Having premature rupture of membranes (PROM).   °· Having a placenta that covers the opening of the cervix (placenta previa).   °· Having a placenta that separates from the uterus (placental abruption).   °· Having a cervix that is too weak to hold the fetus in the uterus (incompetent cervix).   °· Having too much fluid in the amniotic sac (polyhydramnios).   °· Taking illegal drugs or smoking while pregnant.   °· Not gaining enough weight while pregnant.   °· Being younger than 18 and older than 30 years old.   °· Having a low socioeconomic status.   °· Being African American. °SYMPTOMS °Signs and symptoms of preterm labor include:  °· Menstrual-like cramps, abdominal pain, or back pain. °· Uterine contractions that are regular, as frequent as six in an hour, regardless of their intensity (may be mild or painful). °· Contractions that start on the top of the uterus and spread down to the lower abdomen and back.   °· A sense of increased pelvic pressure.   °· A watery or bloody mucus discharge that comes from the vagina.   °TREATMENT °Depending on the length of the pregnancy and other circumstances, your health care provider may suggest bed rest. If necessary, there are medicines that can be given to stop contractions and to mature the fetal lungs. If labor happens before 34 weeks of pregnancy, a prolonged hospital stay may be recommended.  Treatment depends on the condition of both you and the fetus.  °WHAT SHOULD YOU DO IF YOU THINK YOU ARE IN PRETERM LABOR? °Call your health care provider right away. You will need to go to the hospital to get checked immediately. °HOW CAN YOU PREVENT PRETERM LABOR IN FUTURE PREGNANCIES? °You should:  °· Stop smoking if you smoke.  °· Maintain healthy weight gain and avoid chemicals and drugs that are not necessary. °· Be watchful for any type of infection. °· Inform your health care provider if you have a known history of preterm labor. °  °This information is not intended to replace advice given to you by your health care provider. Make sure you discuss any questions you have with your health care provider. °  °Document Released: 05/19/2003 Document Revised: 10/29/2012 Document Reviewed: 03/31/2012 °Elsevier Interactive Patient Education ©2016 [employer] Inc. ° °

## 2015-03-09 NOTE — MAU Provider Note (Signed)
Chief Complaint:  Abdominal Pain and Pelvic Pain   First Provider Initiated Contact with Patient 03/09/15 0044     HPI  HPI: Natasha Bean is a 29 y.o. G1P0 at 23w0dwho presents to maternity admissions reporting lower abdominal pain and low back pain. States pain comes and goes, mostly when walking or turning side to side.  Lying still it does not hurt.  Review of records shows similar complaints on several visits to office since October.  States has had hematuria in office for a few months. Unsure cause.  Last Culture neg. She reports good fetal movement, denies LOF, vaginal bleeding, vaginal itching/burning, urinary symptoms, h/a, dizziness, n/v, or fever/chills.   ED Note: Natasha Bean is a 29 y.o. female who is [redacted] weeks pregnant who presents to the Emergency Department complaining of gradually worsening constant lower back pain, left sided abdominal pain, and vaginal pain onset 3 days ago. She reports associated nausea and gait problems secondary to pain. She denies that the pain comes in waves. No alleviating factors noted. She states that he has gained approx. 15-18 pounds over the course of the pregnancy and this is her first pregnancy. She denies any vaginal discharge, vaginal bleeding, or vomiting.  Past Medical History: Past Medical History  Diagnosis Date  . Asthma   . Anemia   . Migraine   . Vaginal discharge during pregnancy in second trimester 01/06/2015  . Trichimoniasis 01/06/2015  . Round ligament pain 01/06/2015  . Hematuria 01/06/2015  . Urinary frequency 01/06/2015    Past obstetric history: OB History  Gravida Para Term Preterm AB SAB TAB Ectopic Multiple Living  1         0    # Outcome Date GA Lbr Len/2nd Weight Sex Delivery Anes PTL Lv  1 Current               Past Surgical History: History reviewed. No pertinent past surgical history.  Family History: Family History  Problem Relation Age of Onset  . Cancer Mother     throat  . Hypertension  Father   . Diabetes Father   . Cancer Father     colon  . Heart disease Paternal Grandmother   . Hypertension Paternal Grandmother   . Diabetes Paternal Grandmother     Social History: Social History  Substance Use Topics  . Smoking status: Current Every Day Smoker -- 0.25 packs/day for 10 years    Types: Cigarettes  . Smokeless tobacco: Never Used  . Alcohol Use: No    Allergies:  Allergies  Allergen Reactions  . Benadryl [Diphenhydramine Hcl] Other (See Comments)    Makes her shakey  . Fioricet [Butalbital-Apap-Caffeine] Itching and Nausea And Vomiting    Meds:  Prescriptions prior to admission  Medication Sig Dispense Refill Last Dose  . acetaminophen (TYLENOL) 500 MG tablet Take 1,000 mg by mouth every 6 (six) hours as needed. pain   03/08/2015 at Unknown time  . ferrous sulfate 325 (65 FE) MG tablet Take 1 tablet (325 mg total) by mouth 2 (two) times daily with a meal. 60 tablet 3 03/08/2015 at Unknown time  . flintstones complete (FLINTSTONES) 60 MG chewable tablet Chew 2 tablets by mouth daily.   03/08/2015 at Unknown time  . pantoprazole (PROTONIX) 20 MG tablet Take 1 tablet (20 mg total) by mouth daily. 30 tablet 3 03/08/2015 at Unknown time    ROS:  Review of Systems  Constitutional: Negative for fever and chills.  Respiratory: Negative for  shortness of breath.   Genitourinary: Positive for dysuria (at times), frequency and pelvic pain. Negative for flank pain, vaginal bleeding, vaginal discharge and difficulty urinating.  Neurological: Negative for weakness.   I have reviewed patient's Past Medical Hx, Surgical Hx, Family Hx, Social Hx, medications and allergies.   Physical Exam  Patient Vitals for the past 24 hrs:  BP Temp Temp src Pulse Resp SpO2 Height Weight  03/09/15 0002 121/64 mmHg 98.2 F (36.8 C) Oral 66 18 99 %  (1.575 m) 186 lb (84.369 kg)  03/08/15 2252 119/68 mmHg 98.1 F (36.7 C) Oral 83 20 99 % - -  03/08/15 2051 141/88 mmHg 98.2 F  (36.8 C) Oral 78 14 96 %  (1.575 m) 186 lb (84.369 kg)   Constitutional: Well-developed, well-nourished female in no acute distress.  Cardiovascular: normal rate Respiratory: normal effort, no distress GI: Abd soft, non-tender, gravid appropriate for gestational age.  MS: Extremities nontender, no edema, normal ROM Neurologic: Alert and oriented x 4.  GU: Neg CVAT.  PELVIC EXAM: Cervix 0/long/high, soft, posterior, neg CMT, uterus nontender, adnexa without tenderness, enlargement, or mass  Dilation: Closed Effacement (%): Thick Station: Ballotable Exam by:: Artelia Laroche CNM  FHT:  Baseline 140 , moderate variability, accelerations present, no decelerations Contractions: Irregular , intermittent irritability.  No contractions longer than 30 seconds.    Labs: Results for orders placed or performed during the hospital encounter of 03/08/15 (from the past 24 hour(s))  Urinalysis, Routine w reflex microscopic (not at [facility])     Status: Abnormal   Collection Time: 03/08/15 10:07 PM  Result Value Ref Range   Color, Urine YELLOW YELLOW   APPearance CLEAR CLEAR   Specific Gravity, Urine 1.025 1.005 - 1.030   pH 5.5 5.0 - 8.0   Glucose, UA NEGATIVE NEGATIVE mg/dL   Hgb urine dipstick LARGE (A) NEGATIVE   Bilirubin Urine NEGATIVE NEGATIVE   Ketones, ur NEGATIVE NEGATIVE mg/dL   Protein, ur NEGATIVE NEGATIVE mg/dL   Nitrite NEGATIVE NEGATIVE   Leukocytes, UA NEGATIVE NEGATIVE  Urine microscopic-add on     Status: Abnormal   Collection Time: 03/08/15 10:07 PM  Result Value Ref Range   Squamous Epithelial / LPF 6-30 (A) NONE SEEN   WBC, UA 0-5 0 - 5 WBC/hpf   RBC / HPF 0-5 0 - 5 RBC/hpf   Bacteria, UA MANY (A) NONE SEEN  CBC with Differential/Platelet     Status: Abnormal   Collection Time: 03/08/15 10:10 PM  Result Value Ref Range   WBC 8.8 4.0 - 10.5 K/uL   RBC 2.95 (L) 3.87 - 5.11 MIL/uL   Hemoglobin 9.2 (L) 12.0 - 15.0 g/dL   HCT 16.1 (L) 09.6 - 04.5 %   MCV 90.5 78.0 -  100.0 fL   MCH 31.2 26.0 - 34.0 pg   MCHC 34.5 30.0 - 36.0 g/dL   RDW 40.9 81.1 - 91.4 %   Platelets 191 150 - 400 K/uL   Neutrophils Relative % 63 %   Neutro Abs 5.5 1.7 - 7.7 K/uL   Lymphocytes Relative 28 %   Lymphs Abs 2.5 0.7 - 4.0 K/uL   Monocytes Relative 7 %   Monocytes Absolute 0.6 0.1 - 1.0 K/uL   Eosinophils Relative 2 %   Eosinophils Absolute 0.2 0.0 - 0.7 K/uL   Basophils Relative 0 %   Basophils Absolute 0.0 0.0 - 0.1 K/uL  Basic metabolic panel     Status: Abnormal   Collection Time:  03/08/15 10:10 PM  Result Value Ref Range   Sodium 135 135 - 145 mmol/L   Potassium 3.8 3.5 - 5.1 mmol/L   Chloride 107 101 - 111 mmol/L   CO2 21 (L) 22 - 32 mmol/L   Glucose, Bld 89 65 - 99 mg/dL   BUN 12 6 - 20 mg/dL   Creatinine, Ser 1.610.59 0.44 - 1.00 mg/dL   Calcium 9.0 8.9 - 09.610.3 mg/dL   GFR calc non Af Amer >60 >60 mL/min   GFR calc Af Amer >60 >60 mL/min   Anion gap 7 5 - 15   A/Positive/-- (08/22 1126)  Imaging:  No results found.  MAU Course/MDM: I have ordered labs (FFn) and reviewed results.  NST reviewed Consult Dr Shawnie PonsPratt with presentation, exam findings and test results.  Treatments in MAU included fetal fibronectin and IV hydration. Pt stable at time of discharge.   Ref. Range 03/09/2015 00:41  Fetal Fibronectin Latest Ref Range: NEGATIVE  NEGATIVE    Assessment: 1. Pregnancy   2.   Preterm contractions vs irritability with negative fetal fibronectin and no change in cervix  Plan: Discharge home Preterm Labor precautions and fetal kick counts Follow up in Office for prenatal visits and recheck of cervix    Medication List    ASK your doctor about these medications        acetaminophen 500 MG tablet  Commonly known as:  TYLENOL  Take 1,000 mg by mouth every 6 (six) hours as needed. pain     ferrous sulfate 325 (65 FE) MG tablet  Take 1 tablet (325 mg total) by mouth 2 (two) times daily with a meal.     flintstones complete 60 MG chewable tablet   Chew 2 tablets by mouth daily.     pantoprazole 20 MG tablet  Commonly known as:  PROTONIX  Take 1 tablet (20 mg total) by mouth daily.        Wynelle BourgeoisMarie Nyko Gell CNM, MSN Certified Nurse-Midwife 03/09/2015 12:45 AM

## 2015-03-13 NOTE — L&D Delivery Note (Signed)
Delivery Note After a 30 minute 2nd staget 11:08 PM a viable female was delivered via Vaginal, Spontaneous Delivery (Presentation: Left Occiput Anterior).  APGAR: 9/9, ; weight pending.   After 1 minutes, the cord was clamped and cut.  Cord blood was collected for donation.  40 units of pitocin diluted in 1000cc LR was infused rapidly IV.  The placenta separated spontaneously and delivered via CCT and maternal pushing effort.  It was inspected and appears to be intact with a 3 VC.    Anesthesia: Epidural  Episiotomy: None Lacerations:  periurethral Suture Repair: 3.0 vicryl Est. Blood Loss (mL):  400  Mom to postpartum.  Baby to Couplet care / Skin to Skin.   The above was performed by Hermina Barters Gambino, MD under my direct supervision.   CRESENZO-DISHMAN,Arzella Rehmann 05/19/2015, 11:22 PM

## 2015-03-15 ENCOUNTER — Ambulatory Visit (INDEPENDENT_AMBULATORY_CARE_PROVIDER_SITE_OTHER): Payer: Medicaid Other

## 2015-03-15 ENCOUNTER — Other Ambulatory Visit: Payer: Medicaid Other

## 2015-03-15 ENCOUNTER — Ambulatory Visit (INDEPENDENT_AMBULATORY_CARE_PROVIDER_SITE_OTHER): Payer: Medicaid Other | Admitting: Advanced Practice Midwife

## 2015-03-15 ENCOUNTER — Encounter: Payer: Self-pay | Admitting: Advanced Practice Midwife

## 2015-03-15 VITALS — BP 120/86 | HR 84 | Wt 187.0 lb

## 2015-03-15 DIAGNOSIS — Z3402 Encounter for supervision of normal first pregnancy, second trimester: Secondary | ICD-10-CM

## 2015-03-15 DIAGNOSIS — Z131 Encounter for screening for diabetes mellitus: Secondary | ICD-10-CM

## 2015-03-15 DIAGNOSIS — O283 Abnormal ultrasonic finding on antenatal screening of mother: Secondary | ICD-10-CM | POA: Diagnosis not present

## 2015-03-15 DIAGNOSIS — Z369 Encounter for antenatal screening, unspecified: Secondary | ICD-10-CM

## 2015-03-15 DIAGNOSIS — Z1389 Encounter for screening for other disorder: Secondary | ICD-10-CM

## 2015-03-15 DIAGNOSIS — Z331 Pregnant state, incidental: Secondary | ICD-10-CM

## 2015-03-15 LAB — POCT URINALYSIS DIPSTICK
Glucose, UA: NEGATIVE
Ketones, UA: NEGATIVE
LEUKOCYTES UA: NEGATIVE
NITRITE UA: NEGATIVE
PROTEIN UA: NEGATIVE
RBC UA: 4

## 2015-03-15 NOTE — Progress Notes (Signed)
G1P0 1643w6d Estimated Date of Delivery: 06/08/15  Blood pressure 120/86, pulse 84, weight 187 lb (84.823 kg), last menstrual period 09/01/2014.   BP weight and urine results all reviewed and noted.  Please refer to the obstetrical flow sheet for the fundal height and fetal heart rate documentation: US today to recheck Lt EICF: US 27+6 wks,cephalic,cx 3.1cm,normal ov's bilat,LVEICF n/c,post pl gr 1,svp of fluid 4.7cm,efw 1145g 52%,fhr 158 bpm  Patient reports good fetal movement, denies any bleeding and no rupture of membranes symptoms or regular contractions. Patient is without complaints. All questions were answered.  Orders Placed This Encounter  Procedures  . POCT urinalysis dipstick    Plan:  Continued routine obstetrical care, PN2 today  Return in about 3 weeks (around 04/05/2015) for LROB.

## 2015-03-15 NOTE — Patient Instructions (Signed)

## 2015-03-15 NOTE — Progress Notes (Signed)
US 27+6 wks,cephalic,cx 3.1cm,normal ov's bilat,LVEICF n/c,post pl gr 1,svp of fluid 4.7cm,efw 1145g 52%,fhr 158 bpm

## 2015-03-15 NOTE — Progress Notes (Signed)
Pt states that she had to go to the ED for contractions and was given a pill to stop them, pt denies any contractions since then.

## 2015-03-16 ENCOUNTER — Telehealth: Payer: Self-pay | Admitting: *Deleted

## 2015-03-16 LAB — ANTIBODY SCREEN: Antibody Screen: NEGATIVE

## 2015-03-16 LAB — GLUCOSE TOLERANCE, 2 HOURS W/ 1HR
GLUCOSE, FASTING: 74 mg/dL (ref 65–91)
Glucose, 1 hour: 114 mg/dL (ref 65–179)
Glucose, 2 hour: 94 mg/dL (ref 65–152)

## 2015-03-16 LAB — CBC
Hematocrit: 26.5 % — ABNORMAL LOW (ref 34.0–46.6)
Hemoglobin: 9.1 g/dL — ABNORMAL LOW (ref 11.1–15.9)
MCH: 30.7 pg (ref 26.6–33.0)
MCHC: 34.3 g/dL (ref 31.5–35.7)
MCV: 90 fL (ref 79–97)
PLATELETS: 214 10*3/uL (ref 150–379)
RBC: 2.96 x10E6/uL — ABNORMAL LOW (ref 3.77–5.28)
RDW: 13 % (ref 12.3–15.4)
WBC: 7.9 10*3/uL (ref 3.4–10.8)

## 2015-03-16 LAB — HIV ANTIBODY (ROUTINE TESTING W REFLEX): HIV Screen 4th Generation wRfx: NONREACTIVE

## 2015-03-16 LAB — RPR: RPR Ser Ql: NONREACTIVE

## 2015-03-16 NOTE — Telephone Encounter (Signed)
-----   Message from Cheral MarkerKimberly R Booker, [place] sent at 03/16/2015  9:36 AM EST ----- Please make sure she is still taking pnv and fe bid. If she is, increase fe to tid- take w/ OJ. Increase fe-rich foods (red meat, green leafy vegs, beans, cook in cast iron skillet)  ----- Message -----    From: [facility] Lab Results In Interface    Sent: 03/16/2015   5:40 AM      To: Cheral MarkerKimberly R Booker, CNM

## 2015-03-16 NOTE — Telephone Encounter (Signed)
Pt informed per Joellyn HaffKim Booker, CNM increase Fe to TID-take with OJ, increase fe-rich foods, cook in cast iron skillet. Pt states she has been taking her PNV and fe bid.

## 2015-03-22 ENCOUNTER — Encounter: Payer: Self-pay | Admitting: Women's Health

## 2015-03-22 DIAGNOSIS — O283 Abnormal ultrasonic finding on antenatal screening of mother: Secondary | ICD-10-CM | POA: Insufficient documentation

## 2015-04-05 ENCOUNTER — Ambulatory Visit (INDEPENDENT_AMBULATORY_CARE_PROVIDER_SITE_OTHER): Payer: Medicaid Other | Admitting: Advanced Practice Midwife

## 2015-04-05 VITALS — BP 130/74 | HR 84 | Wt 190.8 lb

## 2015-04-05 DIAGNOSIS — Z3403 Encounter for supervision of normal first pregnancy, third trimester: Secondary | ICD-10-CM

## 2015-04-05 DIAGNOSIS — Z331 Pregnant state, incidental: Secondary | ICD-10-CM

## 2015-04-05 DIAGNOSIS — Z1389 Encounter for screening for other disorder: Secondary | ICD-10-CM

## 2015-04-05 LAB — POCT URINALYSIS DIPSTICK
Blood, UA: 3
Glucose, UA: NEGATIVE
Ketones, UA: NEGATIVE
LEUKOCYTES UA: NEGATIVE
NITRITE UA: NEGATIVE
PROTEIN UA: NEGATIVE

## 2015-04-05 NOTE — Progress Notes (Signed)
G1P0 [redacted]w[redacted]d Estimated Date of Delivery: 06/08/15  Blood pressure 130/74, pulse 84, weight 190 lb 12.8 oz (86.546 kg), last menstrual period 09/01/2014.   BP weight and urine results all reviewed and noted.  Please refer to the obstetrical flow sheet for the fundal height and fetal heart rate documentation:  Patient reports good fetal movement, denies any bleeding and no rupture of membranes symptoms or regular contractions. Patient c/o leg cramps at night; tips given.  Also c/o headaches:  Sometimes wakes up with them. Has one today, but not too bad. Hasn't taken anything today. Also has LBP.  All questions were answered.  Orders Placed This Encounter  Procedures  . POCT urinalysis dipstick    Plan:  Continued routine obstetrical care,   Return in about 2 weeks (around 04/19/2015) for LROB.

## 2015-04-05 NOTE — Patient Instructions (Addendum)
Why am I having leg cramps during pregnancy?  No one really knows why pregnant women get more leg cramps. It's possible that your leg muscles are tired from carrying around all of your extra weight. Or they may be aggravated by the pressure your expanding uterus puts on the blood vessels that return blood from your legs to your heart and the nerves that lead from your trunk to your legs.  Leg cramps may start to plague you during your second trimester and may get worse as your pregnancy progresses and your belly gets bigger. While these cramps can occur during the day, you'll probably notice them most at night, when they can interfere with your ability to get a good night's sleep.  How can I prevent leg cramps?  Try these tips for keeping leg cramps at bay:  Avoid standing or sitting with your legs crossed for long periods of time. Stretch your calf muscles regularly during the day and several times before you go to bed. Rotate your ankles and wiggle your toes when you sit, eat dinner, or watch TV. Take a walk every day, unless your midwife or doctor has advised you not to exercise. Avoid getting too tired. Lie down on your left side to improve circulation to and from your legs. Stay hydrated during the day by drinking water regularly. Try a warm bath before bed to relax your muscles. Some research suggests that taking a magnesium supplement in addition to a prenatal vitamin may help some women avoid leg cramps. However, other research showed that magnesium supplements had no significant effect on the frequency or intensity of leg cramps during pregnancy.You may have heard that having leg cramps is a sign that you need more calcium, and that calcium supplements will relieve the problem. Though it's certainly important to get enough calcium, there's no good evidence that taking extra calcium will help prevent leg cramps during pregnancy. In fact, in one well-designed study, pregnant women taking  calcium got no more relief from leg cramps than those taking a placebo.  You may try Chelated Magnesium at a dosage of 240-300mg /day.  What's the best way to relieve a cramp when I get one?  If you do get a cramp, immediately stretch your calf muscles: Straighten your leg, heel first, and gently flex your toes back toward your shins. It might hurt at first, but it will ease the spasm and the pain will gradually go away.  You can try to relax the cramp by massaging the muscle or warming it with a hot water bottle. Walking around for a few minutes may help too.  What if the pain persists?  Call your practitioner if your muscle pain is constant and not just an occasional cramp or if you notice swelling, redness, or tenderness in your leg, or the area feels warm to your touch. These may be signs of a blood clot, which requires immediate medical attention. Blood clots are relatively rare, but they're more common during pregnancy.      Tips to Help Leg Cramps  Increase dietary sources of calcium (milk, yogurt, cheese, leafy greens, seafood, legumes, and fruit) and magnesium (dark leafy greens, nuts, seeds, fish, beans, whole grains, avocados, yogurt, bananas, dried fruit, dark chocolate)  Spoonful of regular yellow mustard every night  Pickle juice  Magnesium supplement: 70mmol in the morning, 35mmol at night (can find in the vitamin aisle)  Dorsiflexion of foot: pointing your toes back towards your knee during the cramp  For Headaches:   Stay well hydrated, drink enough water so that your urine is clear, sometimes if you are dehydrated you can get headaches  Eat small frequent meals and snacks, sometimes if you are hungry you can get headaches  Sometimes you get headaches during pregnancy from the pregnancy hormones  You can try tylenol (1-2 regular strength  or 1-2 extra strength ) as directed on the box. The least amount of medication that works is best.   Cool  compresses (cool wet washcloth or ice pack) to area of head that is hurting  You can also try drinking a caffeinated drink to see if this will help  Call us if these things aren't helping your headaches  Back Pain in Pregnancy Back pain during pregnancy is common. It happens in about half of all pregnancies. It is important for you and your baby that you remain active during your pregnancy.If you feel that back pain is not allowing you to remain active or sleep well, it is time to see your caregiver. Back pain may be caused by several factors related to changes during your pregnancy.Fortunately, unless you had trouble with your back before your pregnancy, the pain is likely to get better after you deliver. Low back pain usually occurs between the fifth and seventh months of pregnancy. It can, however, happen in the first couple months. Factors that increase the risk of back problems include:   Previous back problems.  Injury to your back.  Having twins or multiple births.  A chronic cough.  Stress.  Job-related repetitive motions.  Muscle or spinal disease in the back.  Family history of back problems, ruptured (herniated) discs, or osteoporosis.  Depression, anxiety, and panic attacks. CAUSES   When you are pregnant, your body produces a hormone called relaxin. This hormonemakes the ligaments connecting the low back and pubic bones more flexible. This flexibility allows the baby to be delivered more easily. When your ligaments are loose, your muscles need to work harder to support your back. Soreness in your back can come from tired muscles. Soreness can also come from back tissues that are irritated since they are receiving less support.  As the baby grows, it puts pressure on the nerves and blood vessels in your pelvis. This can cause back pain.  As the baby grows and gets heavier during pregnancy, the uterus pushes the stomach muscles forward and changes your center of  gravity. This makes your back muscles work harder to maintain good posture. SYMPTOMS  Lumbar pain during pregnancy Lumbar pain during pregnancy usually occurs at or above the waist in the center of the back. There may be pain and numbness that radiates into your leg or foot. This is similar to low back pain experienced by non-pregnant women. It usually increases with sitting for long periods of time, standing, or repetitive lifting. Tenderness may also be present in the muscles along your upper back. Posterior pelvic pain during pregnancy Pain in the back of the pelvis is more common than lumbar pain in pregnancy. It is a deep pain felt in your side at the waistline, or across the tailbone (sacrum), or in both places. You may have pain on one or both sides. This pain can also go into the buttocks and backs of the upper thighs. Pubic and groin pain may also be present. The pain does not quickly resolve with rest, and morning stiffness may also be present. Pelvic pain during pregnancy can be brought on by most activities.  A high level of fitness before and during pregnancy may or may not prevent this problem. Labor pain is usually 1 to 2 minutes apart, lasts for about 1 minute, and involves a bearing down feeling or pressure in your pelvis. However, if you are at term with the pregnancy, constant low back pain can be the beginning of early labor, and you should be aware of this. DIAGNOSIS  X-rays of the back should not be done during the first 12 to 14 weeks of the pregnancy and only when absolutely necessary during the rest of the pregnancy. MRIs do not give off radiation and are safe during pregnancy. MRIs also should only be done when absolutely necessary. HOME CARE INSTRUCTIONS  Exercise as directed by your caregiver. Exercise is the most effective way to prevent or manage back pain. If you have a back problem, it is especially important to avoid sports that require sudden body movements. Swimming and  walking are great activities.  Do not stand in one place for long periods of time.  Do not wear high heels.  Sit in chairs with good posture. Use a pillow on your lower back if necessary. Make sure your head rests over your shoulders and is not hanging forward.  Try sleeping on your side, preferably the left side, with a pillow or two between your legs. If you are sore after a night's rest, your bedmay betoo soft.Try placing a board between your mattress and box spring.  Listen to your body when lifting.If you are experiencing pain, ask for help or try bending yourknees more so you can use your leg muscles rather than your back muscles. Squat down when picking up something from the floor. Do not bend over.  Eat a healthy diet. Try to gain weight within your caregiver's recommendations.  Use heat or cold packs 3 to 4 times a day for 15 minutes to help with the pain.  Only take over-the-counter or prescription medicines for pain, discomfort, or fever as directed by your caregiver. Sudden (acute) back pain  Use bed rest for only the most extreme, acute episodes of back pain. Prolonged bed rest over 48 hours will aggravate your condition.  Ice is very effective for acute conditions.  Put ice in a plastic bag.  Place a towel between your skin and the bag.  Leave the ice on for 10 to 20 minutes every 2 hours, or as needed.  Using heat packs for 30 minutes prior to activities is also helpful. Continued back pain See your caregiver if you have continued problems. Your caregiver can help or refer you for appropriate physical therapy. With conditioning, most back problems can be avoided. Sometimes, a more serious issue may be the cause of back pain. You should be seen right away if new problems seem to be developing. Your caregiver may recommend:  A maternity girdle.  An elastic sling.  A back brace.  A massage therapist or acupuncture. SEEK MEDICAL CARE IF:   You are not able to  do most of your daily activities, even when taking the pain medicine you were given.  You need a referral to a physical therapist or chiropractor.  You want to try acupuncture. SEEK IMMEDIATE MEDICAL CARE IF:  You develop numbness, tingling, weakness, or problems with the use of your arms or legs.  You develop severe back pain that is no longer relieved with medicines.  You have a sudden change in bowel or bladder control.  You have increasing pain in  other areas of the body.  You develop shortness of breath, dizziness, or fainting.  You develop nausea, vomiting, or sweating.  You have back pain which is similar to labor pains.  You have back pain along with your water breaking or vaginal bleeding.  You have back pain or numbness that travels down your leg.  Your back pain developed after you fell.  You develop pain on one side of your back. You may have a kidney stone.  You see blood in your urine. You may have a bladder infection or kidney stone.  You have back pain with blisters. You may have shingles. Back pain is fairly common during pregnancy but should not be accepted as just part of the process. Back pain should always be treated as soon as possible. This will make your pregnancy as pleasant as possible.   This information is not intended to replace advice given to you by your health care provider. Make sure you discuss any questions you have with your health care provider.   Document Released: 06/06/2005 Document Revised: 05/21/2011 Document Reviewed: 07/18/2010 Elsevier Interactive Patient Education 2016 [employer].   Safe Medications in Pregnancy   Acne: Benzoyl Peroxide Salicylic Acid  Backache/Headache: Tylenol: 2 regular strength every 4 hours OR              2 Extra strength every 6 hours  Colds/Coughs/Allergies: Benadryl (alcohol free) 25 mg every 6 hours as needed Breath right strips Claritin Cepacol throat lozenges Chloraseptic throat  spray Cold-Eeze- up to three times per day Cough drops, alcohol free Flonase (by prescription only) Guaifenesin Mucinex Robitussin DM (plain only, alcohol free) Saline nasal spray/drops Sudafed (pseudoephedrine) & Actifed ** use only after [redacted] weeks gestation and if you do not have high blood pressure Tylenol Vicks Vaporub Zinc lozenges Zyrtec   Constipation: Colace Ducolax suppositories Fleet enema Glycerin suppositories Metamucil Milk of magnesia Miralax Senokot Smooth move tea  Diarrhea: Kaopectate Imodium A-D  *NO pepto Bismol  Hemorrhoids: Anusol Anusol HC Preparation H Tucks  Indigestion: Tums Maalox Mylanta Zantac  Pepcid  Insomnia: Benadryl (alcohol free)  every 6 hours as needed Tylenol PM Unisom, no Gelcaps  Leg Cramps: Tums MagGel  Nausea/Vomiting:  Bonine Dramamine Emetrol Ginger extract Sea bands Meclizine  Nausea medication to take during pregnancy:  Unisom (doxylamine succinate 25 mg tablets) Take one tablet daily at bedtime. If symptoms are not adequately controlled, the dose can be increased to a maximum recommended dose of two tablets daily (1/2 tablet in the morning, 1/2 tablet mid-afternoon and one at bedtime). Vitamin B6  tablets. Take one tablet twice a day (up to 200 mg per day).  Skin Rashes: Aveeno products Benadryl cream or  every 6 hours as needed Calamine Lotion 1% cortisone cream  Yeast infection: Gyne-lotrimin 7 Monistat 7   **If taking multiple medications, please check labels to avoid duplicating the same active ingredients **take medication as directed on the label ** Do not exceed 4000 mg of tylenol in 24 hours **Do not take medications that contain aspirin or ibuprofen

## 2015-04-19 ENCOUNTER — Ambulatory Visit (INDEPENDENT_AMBULATORY_CARE_PROVIDER_SITE_OTHER): Payer: Medicaid Other | Admitting: Women's Health

## 2015-04-19 ENCOUNTER — Encounter: Payer: Self-pay | Admitting: Women's Health

## 2015-04-19 VITALS — BP 140/62 | HR 72 | Wt 193.0 lb

## 2015-04-19 DIAGNOSIS — IMO0001 Reserved for inherently not codable concepts without codable children: Secondary | ICD-10-CM

## 2015-04-19 DIAGNOSIS — R030 Elevated blood-pressure reading, without diagnosis of hypertension: Secondary | ICD-10-CM

## 2015-04-19 DIAGNOSIS — F1290 Cannabis use, unspecified, uncomplicated: Secondary | ICD-10-CM

## 2015-04-19 DIAGNOSIS — Z1389 Encounter for screening for other disorder: Secondary | ICD-10-CM

## 2015-04-19 DIAGNOSIS — Z3403 Encounter for supervision of normal first pregnancy, third trimester: Secondary | ICD-10-CM

## 2015-04-19 DIAGNOSIS — Z331 Pregnant state, incidental: Secondary | ICD-10-CM

## 2015-04-19 LAB — POCT URINALYSIS DIPSTICK
GLUCOSE UA: NEGATIVE
Ketones, UA: NEGATIVE
Leukocytes, UA: NEGATIVE
Nitrite, UA: NEGATIVE

## 2015-04-19 NOTE — Patient Instructions (Signed)
Call the office [phone]) or go to [facility] hospital for these signs of pre-eclampsia:  Severe headache that does not go away with Tylenol  Visual changes- seeing spots, double, blurred vision  Pain under your right breast or upper abdomen that does not go away with Tums or heartburn medicine  Nausea and/or vomiting  Severe swelling in your hands, feet, and face     Call the office [phone]) or go to [facility] if:  You begin to have strong, frequent contractions  Your water breaks.  Sometimes it is a big gush of fluid, sometimes it is just a trickle that keeps getting your panties wet or running down your legs  You have vaginal bleeding.  It is normal to have a small amount of spotting if your cervix was checked.   You don't feel your baby moving like normal.  If you don't, get you something to eat and drink and lay down and focus on feeling your baby move.  You should feel at least 10 movements in 2 hours.  If you don't, you should call the office or go to [facility].    Preterm Labor Information Preterm labor is when labor starts at less than 37 weeks of pregnancy. The normal length of a pregnancy is 39 to 41 weeks. CAUSES Often, there is no identifiable underlying cause as to why a woman goes into preterm labor. One of the most common known causes of preterm labor is infection. Infections of the uterus, cervix, vagina, amniotic sac, bladder, kidney, or even the lungs (pneumonia) can cause labor to start. Other suspected causes of preterm labor include:   Urogenital infections, such as yeast infections and bacterial vaginosis.   Uterine abnormalities (uterine shape, uterine septum, fibroids, or bleeding from the placenta).   A cervix that has been operated on (it may fail to stay closed).   Malformations in the fetus.   Multiple gestations (twins, triplets, and so on).   Breakage of the amniotic sac.  RISK FACTORS  Having a previous history of preterm  labor.   Having premature rupture of membranes (PROM).   Having a placenta that covers the opening of the cervix (placenta previa).   Having a placenta that separates from the uterus (placental abruption).   Having a cervix that is too weak to hold the fetus in the uterus (incompetent cervix).   Having too much fluid in the amniotic sac (polyhydramnios).   Taking illegal drugs or smoking while pregnant.   Not gaining enough weight while pregnant.   Being younger than 55 and older than 31 years old.   Having a low socioeconomic status.   Being African American. SYMPTOMS Signs and symptoms of preterm labor include:   Menstrual-like cramps, abdominal pain, or back pain.  Uterine contractions that are regular, as frequent as six in an hour, regardless of their intensity (may be mild or painful).  Contractions that start on the top of the uterus and spread down to the lower abdomen and back.   A sense of increased pelvic pressure.   A watery or bloody mucus discharge that comes from the vagina.  TREATMENT Depending on the length of the pregnancy and other circumstances, your health care provider may suggest bed rest. If necessary, there are medicines that can be given to stop contractions and to mature the fetal lungs. If labor happens before 34 weeks of pregnancy, a prolonged hospital stay may be recommended. Treatment depends on the condition of both you and the fetus.  WHAT SHOULD YOU DO IF YOU THINK YOU ARE IN PRETERM LABOR? Call your health care provider right away. You will need to go to the hospital to get checked immediately. HOW CAN YOU PREVENT PRETERM LABOR IN FUTURE PREGNANCIES? You should:   Stop smoking if you smoke.  Maintain healthy weight gain and avoid chemicals and drugs that are not necessary.  Be watchful for any type of infection.  Inform your health care provider if you have a known history of preterm labor.   This information is not  intended to replace advice given to you by your health care provider. Make sure you discuss any questions you have with your health care provider.   Document Released: 05/19/2003 Document Revised: 10/29/2012 Document Reviewed: 03/31/2012 Elsevier Interactive Patient Education [employer]

## 2015-04-19 NOTE — Progress Notes (Signed)
Low-risk OB appointment G1P0 [redacted]w[redacted]d Estimated Date of Delivery: 06/08/15 BP 140/62 mmHg  Pulse 72  Wt 193 lb (87.544 kg)  LMP 09/01/2014  BP, weight, and urine reviewed.  Refer to obstetrical flow sheet for FH & FHR.  Reports good fm.  Denies regular uc's, lof, vb, or uti s/s. +BH/pressure, some spotting the other day. Some ha's x ~1wk, takes apap w/o relief. Denies visual changes, epigastric/ruq pain, n/v.  DTRs 2+, no clonus, trace edema Spec exam: cx visually closed, normal creamy white nonodorous d/c SVE: LTC Reviewed pre-e s/s, ptl s/s,  fkc. Plan:  Pre-e labs today, Continue routine obstetrical care  F/U in 2d for OB appointment/bp check

## 2015-04-20 LAB — CBC
Hematocrit: 28.7 % — ABNORMAL LOW (ref 34.0–46.6)
Hemoglobin: 9.5 g/dL — ABNORMAL LOW (ref 11.1–15.9)
MCH: 30 pg (ref 26.6–33.0)
MCHC: 33.1 g/dL (ref 31.5–35.7)
MCV: 91 fL (ref 79–97)
PLATELETS: 213 10*3/uL (ref 150–379)
RBC: 3.17 x10E6/uL — ABNORMAL LOW (ref 3.77–5.28)
RDW: 14 % (ref 12.3–15.4)
WBC: 8.6 10*3/uL (ref 3.4–10.8)

## 2015-04-20 LAB — PMP SCREEN PROFILE (10S), URINE
AMPHETAMINE SCRN UR: NEGATIVE ng/mL
BENZODIAZEPINE SCREEN, URINE: NEGATIVE ng/mL
Barbiturate Screen, Ur: NEGATIVE ng/mL
CREATININE(CRT), U: 143.3 mg/dL (ref 20.0–300.0)
Cannabinoids Ur Ql Scn: NEGATIVE ng/mL
Cocaine(Metab.)Screen, Urine: NEGATIVE ng/mL
Methadone Scn, Ur: NEGATIVE ng/mL
OPIATE SCRN UR: NEGATIVE ng/mL
OXYCODONE+OXYMORPHONE UR QL SCN: NEGATIVE ng/mL
PCP SCRN UR: NEGATIVE ng/mL
PROPOXYPHENE SCREEN: NEGATIVE ng/mL
Ph of Urine: 5.9 (ref 4.5–8.9)

## 2015-04-20 LAB — COMPREHENSIVE METABOLIC PANEL
A/G RATIO: 1.3 (ref 1.1–2.5)
ALT: 12 IU/L (ref 0–32)
AST: 14 IU/L (ref 0–40)
Albumin: 3.3 g/dL — ABNORMAL LOW (ref 3.5–5.5)
Alkaline Phosphatase: 77 IU/L (ref 39–117)
BILIRUBIN TOTAL: 0.4 mg/dL (ref 0.0–1.2)
BUN/Creatinine Ratio: 9 (ref 8–20)
BUN: 6 mg/dL (ref 6–20)
CALCIUM: 8.4 mg/dL — AB (ref 8.7–10.2)
CO2: 20 mmol/L (ref 18–29)
Chloride: 103 mmol/L (ref 96–106)
Creatinine, Ser: 0.66 mg/dL (ref 0.57–1.00)
GFR, EST AFRICAN AMERICAN: 137 mL/min/{1.73_m2} (ref >59)
GFR, EST NON AFRICAN AMERICAN: 119 mL/min/{1.73_m2} (ref >59)
Globulin, Total: 2.5 g/dL (ref 1.5–4.5)
Glucose: 85 mg/dL (ref 65–99)
Potassium: 3.7 mmol/L (ref 3.5–5.2)
Sodium: 139 mmol/L (ref 134–144)
Total Protein: 5.8 g/dL — ABNORMAL LOW (ref 6.0–8.5)

## 2015-04-20 LAB — PROTEIN / CREATININE RATIO, URINE
Creatinine, Urine: 121.3 mg/dL
PROTEIN/CREAT RATIO: 288 mg/g{creat} — AB (ref 0–200)
Protein, Ur: 34.9 mg/dL

## 2015-04-21 ENCOUNTER — Encounter: Payer: Self-pay | Admitting: Obstetrics & Gynecology

## 2015-04-21 ENCOUNTER — Ambulatory Visit (INDEPENDENT_AMBULATORY_CARE_PROVIDER_SITE_OTHER): Payer: Medicaid Other | Admitting: Obstetrics & Gynecology

## 2015-04-21 VITALS — BP 140/80 | HR 80 | Wt 194.0 lb

## 2015-04-21 DIAGNOSIS — Z1389 Encounter for screening for other disorder: Secondary | ICD-10-CM

## 2015-04-21 DIAGNOSIS — Z3403 Encounter for supervision of normal first pregnancy, third trimester: Secondary | ICD-10-CM

## 2015-04-21 DIAGNOSIS — Z331 Pregnant state, incidental: Secondary | ICD-10-CM

## 2015-04-21 LAB — POCT URINALYSIS DIPSTICK
GLUCOSE UA: NEGATIVE
Leukocytes, UA: NEGATIVE
Nitrite, UA: NEGATIVE
RBC UA: 3

## 2015-04-21 NOTE — Progress Notes (Signed)
Pt denies any problems or concerns at this time.  

## 2015-04-21 NOTE — Progress Notes (Signed)
G1P0 [redacted]w[redacted]d Estimated Date of Delivery: 06/08/15  Blood pressure 140/80, pulse 80, weight 194 lb (87.998 kg), last menstrual period 09/01/2014.   BP weight and urine results all reviewed and noted.  Please refer to the obstetrical flow sheet for the fundal height and fetal heart rate documentation:  Patient reports good fetal movement, denies any bleeding and no rupture of membranes symptoms or regular contractions. Patient is without complaints. All questions were answered.  Orders Placed This Encounter  Procedures  . POCT urinalysis dipstick    Plan:  Continued routine obstetrical care, BP stable, Pr/Cr ratio 288 noted, no diagnosis at this point  No Follow-up on file.

## 2015-05-03 ENCOUNTER — Ambulatory Visit (INDEPENDENT_AMBULATORY_CARE_PROVIDER_SITE_OTHER): Payer: Medicaid Other | Admitting: Advanced Practice Midwife

## 2015-05-03 ENCOUNTER — Encounter: Payer: Self-pay | Admitting: Advanced Practice Midwife

## 2015-05-03 VITALS — BP 140/84 | HR 92 | Wt 200.0 lb

## 2015-05-03 DIAGNOSIS — O133 Gestational [pregnancy-induced] hypertension without significant proteinuria, third trimester: Secondary | ICD-10-CM

## 2015-05-03 DIAGNOSIS — Z1389 Encounter for screening for other disorder: Secondary | ICD-10-CM

## 2015-05-03 DIAGNOSIS — R319 Hematuria, unspecified: Secondary | ICD-10-CM

## 2015-05-03 DIAGNOSIS — O1490 Unspecified pre-eclampsia, unspecified trimester: Secondary | ICD-10-CM | POA: Insufficient documentation

## 2015-05-03 DIAGNOSIS — Z331 Pregnant state, incidental: Secondary | ICD-10-CM

## 2015-05-03 LAB — POCT URINALYSIS DIPSTICK
GLUCOSE UA: NEGATIVE
Ketones, UA: NEGATIVE
Leukocytes, UA: NEGATIVE
Nitrite, UA: NEGATIVE

## 2015-05-03 MED ORDER — LIDOCAINE VISCOUS 2 % MT SOLN: OROMUCOSAL | Status: DC

## 2015-05-03 MED ORDER — CYCLOBENZAPRINE HCL 10 MG PO TABS: 10.0000 mg | ORAL_TABLET | Freq: Three times a day (TID) | ORAL | Status: DC | PRN

## 2015-05-03 NOTE — Progress Notes (Signed)
Fetal Surveillance Testing today:  doppler   WORK IN   High Risk Pregnancy Diagnosis(es):   GHTN  G1P0 [redacted]w[redacted]d Estimated Date of Delivery: 06/08/15  Blood pressure 140/84, pulse 92, weight 200 lb (90.719 kg), last menstrual period 09/01/2014.  Urinalysis: Positive for 1+ protein   HPI: The patient is being seen today for ongoing management of pregnancy.  She has now had SBP of 140 X3, giving her the dx of GHTN.  Marland Kitchen Today she reports headache for 3 days; dysuria today and substernal chest pain this afternoon. HA is "all over my head", no vision changes.  Has tried tylenol without success. Hard to say if dysuria is true (vs pelvic discomfor in general)-pt not very good at describing. Chest pain is all along sternum, sounds like esophagus.  Already on protonix   BP weight and urine results all reviewed and noted. Patient reports good fetal movement, denies any bleeding and no rupture of membranes symptoms or regular contractions.    Patient is without complaints other than noted in her HPI. All questions were answered.  All lab and sonogram results have been reviewed. Comments:  None today  Assessment:  1.  Pregnancy at [redacted]w[redacted]d,  Estimated Date of Delivery: 06/08/15 :                          2.  GHTN                        3.  proteinuria  Medication(s) Plans:  Viscous lidocaine--if it relieves chest pain, increase protonix to BID Fioricet caused N/V. Rx Flexeril. May need to rx tramadol   Treatment Plan:  Culture urine.  PreX labs, 24 hour urine for protein.  Will need twice weekly testing for GHTN  Return in about 3 days (around 05/06/2015), or cancel 2/23 and reschedule for:, for HROB, As scheduled, US:EFW, US:BPP. f   Meds ordered this encounter  Medications  . cyclobenzaprine (FLEXERIL) 10 MG tablet    Sig: Take 1 tablet (10 mg total) by mouth every 8 (eight) hours as needed for muscle spasms.    Dispense:  30 tablet    Refill:  0    Order Specific Question:  Supervising  Provider    Answer:  Despina Hidden, LUTHER H [[id number]]  . lidocaine (XYLOCAINE) 2 % solution    Sig: Swallow 1 tsp for chest discomfort    Dispense:  100 mL    Refill:  0    Order Specific Question:  Supervising Provider    Answer:  Duane Lope H [[id number]]   Orders Placed This Encounter  Procedures  . Urine culture  . US OB Follow Up  . CBC  . Comprehensive metabolic panel  . Protein, urine, 24 hour  . POCT Urinalysis Dipstick

## 2015-05-04 LAB — COMPREHENSIVE METABOLIC PANEL
ALBUMIN: 3.5 g/dL (ref 3.5–5.5)
ALT: 11 IU/L (ref 0–32)
AST: 11 IU/L (ref 0–40)
Albumin/Globulin Ratio: 1.5 (ref 1.1–2.5)
Alkaline Phosphatase: 97 IU/L (ref 39–117)
BUN / CREAT RATIO: 9 (ref 8–20)
BUN: 6 mg/dL (ref 6–20)
Bilirubin Total: 0.4 mg/dL (ref 0.0–1.2)
CALCIUM: 8.7 mg/dL (ref 8.7–10.2)
CO2: 19 mmol/L (ref 18–29)
CREATININE: 0.64 mg/dL (ref 0.57–1.00)
Chloride: 106 mmol/L (ref 96–106)
GFR, EST AFRICAN AMERICAN: 138 mL/min/{1.73_m2} (ref >59)
GFR, EST NON AFRICAN AMERICAN: 120 mL/min/{1.73_m2} (ref >59)
GLOBULIN, TOTAL: 2.3 g/dL (ref 1.5–4.5)
Glucose: 79 mg/dL (ref 65–99)
Potassium: 4 mmol/L (ref 3.5–5.2)
SODIUM: 142 mmol/L (ref 134–144)
TOTAL PROTEIN: 5.8 g/dL — AB (ref 6.0–8.5)

## 2015-05-04 LAB — CBC
HEMATOCRIT: 30 % — AB (ref 34.0–46.6)
Hemoglobin: 10 g/dL — ABNORMAL LOW (ref 11.1–15.9)
MCH: 30.6 pg (ref 26.6–33.0)
MCHC: 33.3 g/dL (ref 31.5–35.7)
MCV: 92 fL (ref 79–97)
Platelets: 220 10*3/uL (ref 150–379)
RBC: 3.27 x10E6/uL — AB (ref 3.77–5.28)
RDW: 14.1 % (ref 12.3–15.4)
WBC: 9 10*3/uL (ref 3.4–10.8)

## 2015-05-05 ENCOUNTER — Encounter: Payer: Medicaid Other | Admitting: Advanced Practice Midwife

## 2015-05-05 ENCOUNTER — Other Ambulatory Visit: Payer: Self-pay | Admitting: Advanced Practice Midwife

## 2015-05-05 DIAGNOSIS — O133 Gestational [pregnancy-induced] hypertension without significant proteinuria, third trimester: Secondary | ICD-10-CM

## 2015-05-05 LAB — URINE CULTURE

## 2015-05-06 ENCOUNTER — Ambulatory Visit (INDEPENDENT_AMBULATORY_CARE_PROVIDER_SITE_OTHER): Payer: Medicaid Other | Admitting: Obstetrics and Gynecology

## 2015-05-06 ENCOUNTER — Encounter: Payer: Self-pay | Admitting: Obstetrics and Gynecology

## 2015-05-06 ENCOUNTER — Ambulatory Visit (INDEPENDENT_AMBULATORY_CARE_PROVIDER_SITE_OTHER): Payer: Medicaid Other

## 2015-05-06 VITALS — BP 138/90 | HR 66 | Wt 200.0 lb

## 2015-05-06 DIAGNOSIS — O133 Gestational [pregnancy-induced] hypertension without significant proteinuria, third trimester: Secondary | ICD-10-CM

## 2015-05-06 DIAGNOSIS — O1213 Gestational proteinuria, third trimester: Secondary | ICD-10-CM

## 2015-05-06 DIAGNOSIS — Z1389 Encounter for screening for other disorder: Secondary | ICD-10-CM

## 2015-05-06 DIAGNOSIS — Z331 Pregnant state, incidental: Secondary | ICD-10-CM

## 2015-05-06 DIAGNOSIS — O09893 Supervision of other high risk pregnancies, third trimester: Secondary | ICD-10-CM

## 2015-05-06 DIAGNOSIS — Z3A36 36 weeks gestation of pregnancy: Secondary | ICD-10-CM

## 2015-05-06 DIAGNOSIS — Z3A33 33 weeks gestation of pregnancy: Secondary | ICD-10-CM

## 2015-05-06 LAB — PROTEIN, URINE, 24 HOUR
Protein, 24H Urine: 310.4 mg/24 hr — ABNORMAL HIGH (ref 30.0–150.0)
Protein, Ur: 38.8 mg/dL

## 2015-05-06 LAB — POCT URINALYSIS DIPSTICK
Blood, UA: 4
Glucose, UA: NEGATIVE
KETONES UA: NEGATIVE
LEUKOCYTES UA: NEGATIVE
NITRITE UA: NEGATIVE

## 2015-05-06 NOTE — Progress Notes (Signed)
Pt denies any problems or concerns at this time.  

## 2015-05-06 NOTE — Progress Notes (Signed)
High Risk Pregnancy Diagnosis(es):   Gest HTN, preterm At [redacted]w[redacted]d now having biweekly testing , had U/S BPP today. 8/8    Blood pressure 138/90, pulse 66, weight 200 lb (90.719 kg), last menstrual period 09/01/2014. HPI: G10P0 [redacted]w[redacted]d Estimated Date of Delivery: 06/08/15     Notes mild h/a tx'd with tylenol x 1 wk, some spots in vision x 5 min at time 1-2x/day x 1 wk.   NO ruq pain.   BP weight and urine results reviewed and notable for bp 138/90. Reflexes good. Patient reports    good fetal movement, denies any bleeding and no rupture of membranes symptoms or regular contractions .  Fundal Height:  36 Fetal Heart rate:  137  Edema:  1-2+ normal reflexes Urinalysis: Positive for TRACE protein  .Assessment HROB :G1P0  @ [redacted]w[redacted]d,   Gest HTN, currently sufficently stable for continued outpt care.  Medication(s) Plans:  No bp meds.  Treatment Plan:        Biweekly testing nst and labs on Tues, BPP on Friday, IOL at 37 wk Follow up:           0.5 weeks for  nst  And labs All questions were answered. Pt aware to go to [facility] for incr symptoms.  Tilda Burrow, MD

## 2015-05-06 NOTE — Progress Notes (Signed)
[place] 35+2 wks,fhr 137 bpm,cephalic,normal ov's bilat,post pl gr 3,afi 15cm,RI .63,.69,LVEICF n/c,BPP 8/8, EFW 2488g 34%

## 2015-05-07 LAB — PROTEIN / CREATININE RATIO, URINE
CREATININE, UR: 92.4 mg/dL
PROTEIN/CREAT RATIO: 471 mg/g{creat} — AB (ref 0–200)
Protein, Ur: 43.5 mg/dL

## 2015-05-09 ENCOUNTER — Telehealth: Payer: Self-pay | Admitting: *Deleted

## 2015-05-10 ENCOUNTER — Encounter: Payer: Self-pay | Admitting: Obstetrics & Gynecology

## 2015-05-10 ENCOUNTER — Ambulatory Visit (INDEPENDENT_AMBULATORY_CARE_PROVIDER_SITE_OTHER): Payer: Medicaid Other | Admitting: Obstetrics & Gynecology

## 2015-05-10 VITALS — BP 120/72 | HR 92 | Wt 203.0 lb

## 2015-05-10 DIAGNOSIS — Z1389 Encounter for screening for other disorder: Secondary | ICD-10-CM

## 2015-05-10 DIAGNOSIS — O133 Gestational [pregnancy-induced] hypertension without significant proteinuria, third trimester: Secondary | ICD-10-CM | POA: Diagnosis not present

## 2015-05-10 DIAGNOSIS — Z331 Pregnant state, incidental: Secondary | ICD-10-CM

## 2015-05-10 DIAGNOSIS — O09893 Supervision of other high risk pregnancies, third trimester: Secondary | ICD-10-CM

## 2015-05-10 LAB — POCT URINALYSIS DIPSTICK
Blood, UA: 4
GLUCOSE UA: NEGATIVE
KETONES UA: NEGATIVE
Leukocytes, UA: NEGATIVE
Nitrite, UA: NEGATIVE

## 2015-05-10 NOTE — Progress Notes (Signed)
Fetal Surveillance Testing today:  Reactive NST   High Risk Pregnancy Diagnosis(es):   GHTN  G1P0 [redacted]w[redacted]d Estimated Date of Delivery: 06/08/15  Blood pressure 120/72, pulse 92, weight 203 lb (92.08 kg), last menstrual period 09/01/2014.  Urinalysis: Negative   HPI: The patient is being seen today for ongoing management of GHTN. Today she reports no headaches or CNS symptoms   BP weight and urine results all reviewed and noted. Patient reports good fetal movement, denies any bleeding and no rupture of membranes symptoms or regular contractions.  Fundal Height:  36 Fetal Heart rate:  130 Edema:  none  Patient is without complaints other than noted in her HPI. All questions were answered.  All lab and sonogram results have been reviewed. Comments:    Assessment:  1.  Pregnancy at [redacted]w[redacted]d,  Estimated Date of Delivery: 06/08/15 :                          2.  Gestational Hypertension                        3.    Medication(s) Plans:  None at this point  Treatment Plan:  Twice weekly surveillance, NST alt with sono, indeuction 37-39 weeks depending on clinical course  Return for BPP/sono, HROB. for appointment for high risk OB care  No orders of the defined types were placed in this encounter.   Orders Placed This Encounter  Procedures  . [place] UA Cord Doppler  . US Fetal BPP W/O Non Stress  . POCT urinalysis dipstick  . Fetal nonstress test

## 2015-05-10 NOTE — Progress Notes (Signed)
Pt states that she is having headaches.

## 2015-05-11 ENCOUNTER — Encounter: Payer: Self-pay | Admitting: Advanced Practice Midwife

## 2015-05-11 NOTE — Progress Notes (Signed)
See below note.

## 2015-05-12 NOTE — Telephone Encounter (Signed)
Informed [facility] pt was needed to speak with Crystal to discuss assistance through the [facility] program.

## 2015-05-13 ENCOUNTER — Ambulatory Visit (INDEPENDENT_AMBULATORY_CARE_PROVIDER_SITE_OTHER): Payer: Medicaid Other | Admitting: Obstetrics and Gynecology

## 2015-05-13 ENCOUNTER — Ambulatory Visit (INDEPENDENT_AMBULATORY_CARE_PROVIDER_SITE_OTHER): Payer: Medicaid Other

## 2015-05-13 ENCOUNTER — Encounter: Payer: Self-pay | Admitting: Obstetrics and Gynecology

## 2015-05-13 VITALS — BP 128/86 | HR 77 | Wt 202.5 lb

## 2015-05-13 DIAGNOSIS — O133 Gestational [pregnancy-induced] hypertension without significant proteinuria, third trimester: Secondary | ICD-10-CM | POA: Diagnosis not present

## 2015-05-13 DIAGNOSIS — O09893 Supervision of other high risk pregnancies, third trimester: Secondary | ICD-10-CM

## 2015-05-13 DIAGNOSIS — Z3A37 37 weeks gestation of pregnancy: Secondary | ICD-10-CM

## 2015-05-13 DIAGNOSIS — O99019 Anemia complicating pregnancy, unspecified trimester: Secondary | ICD-10-CM

## 2015-05-13 DIAGNOSIS — O1493 Unspecified pre-eclampsia, third trimester: Secondary | ICD-10-CM

## 2015-05-13 NOTE — Progress Notes (Signed)
Pt states that she has been vomiting this morning and feels really bad. Pt states that she is still having headaches and seeing spots.

## 2015-05-13 NOTE — Progress Notes (Signed)
US 36+2 wks,cephalic,post pl gr 3,bilat adnexa's wnl,BPP 8/8,RI .65,.66,FHR 153 bpm,afi 13.2 cm

## 2015-05-15 NOTE — Progress Notes (Signed)
G1P0 1627w4d Estimated Date of Delivery: 06/08/15 LROB appt. Blood pressure 128/86, pulse 77, weight 91.853 kg (202 lb 8 oz), last menstrual period 09/01/2014.   refer to the ob flow sheet for FH and FHR, also BP, Wt, Urine results:notable for neg protein  Patient reports   good fetal movement, denies any bleeding and no rupture of membranes symptoms or regular contractions. Patient complaints:none.  Questions were answered. Assessment: LROB G1P0 @ 7827w4d GBS collected along with GC / Chl  Plan:  Continued routine obstetrical care,   F/u in 1 weeks for lrob

## 2015-05-17 ENCOUNTER — Telehealth (HOSPITAL_COMMUNITY): Payer: Self-pay | Admitting: *Deleted

## 2015-05-17 ENCOUNTER — Ambulatory Visit (INDEPENDENT_AMBULATORY_CARE_PROVIDER_SITE_OTHER): Payer: Medicaid Other | Admitting: Obstetrics and Gynecology

## 2015-05-17 ENCOUNTER — Encounter: Payer: Self-pay | Admitting: Obstetrics and Gynecology

## 2015-05-17 ENCOUNTER — Encounter (HOSPITAL_COMMUNITY): Payer: Self-pay | Admitting: *Deleted

## 2015-05-17 ENCOUNTER — Other Ambulatory Visit: Payer: Self-pay | Admitting: Obstetrics and Gynecology

## 2015-05-17 VITALS — BP 118/80 | HR 90 | Wt 204.5 lb

## 2015-05-17 DIAGNOSIS — Z369 Encounter for antenatal screening, unspecified: Secondary | ICD-10-CM

## 2015-05-17 DIAGNOSIS — Z1389 Encounter for screening for other disorder: Secondary | ICD-10-CM

## 2015-05-17 DIAGNOSIS — Z3A37 37 weeks gestation of pregnancy: Secondary | ICD-10-CM

## 2015-05-17 DIAGNOSIS — O09893 Supervision of other high risk pregnancies, third trimester: Secondary | ICD-10-CM

## 2015-05-17 DIAGNOSIS — O1213 Gestational proteinuria, third trimester: Secondary | ICD-10-CM

## 2015-05-17 DIAGNOSIS — O1493 Unspecified pre-eclampsia, third trimester: Secondary | ICD-10-CM | POA: Diagnosis not present

## 2015-05-17 DIAGNOSIS — Z331 Pregnant state, incidental: Secondary | ICD-10-CM

## 2015-05-17 LAB — POCT URINALYSIS DIPSTICK
Blood, UA: 4
Glucose, UA: NEGATIVE
LEUKOCYTES UA: NEGATIVE
NITRITE UA: NEGATIVE
PROTEIN UA: 1

## 2015-05-17 NOTE — Telephone Encounter (Signed)
Preadmission screen  

## 2015-05-17 NOTE — Progress Notes (Signed)
High Risk Pregnancy Diagnosis(es):   Preeclampsia, by BP and pr/cr ratio now at 0.477  Blood pressure 118/80, pulse 90, weight 204 lb 8 oz (92.761 kg), last menstrual period 09/01/2014. HPI: G1P0 223w6d Estimated Date of Delivery: 06/08/15     Will be 37 wk tomorrow.     BP weight and urine results reviewed and notable for 140/90, recheck 118/80. Patient reports    good fetal movement, denies any bleeding and no rupture of membranes symptoms or regular contractions .  Fundal Height:  37 Fetal Heart rate:  140 Edema:  Neg Reflexes 1+ no clonus Urinalysis: Positive for 1+ protein  .Assessment HROB :G1P0  @ 263w6d,   Mild PreE  Medication(s) Plans:  none  Treatment Plan:         IOL at 37 wk scheduled for tonight at midnight Follow up:           1 weeks for  bp check All questions were answered.

## 2015-05-17 NOTE — H&P (Signed)
Natasha Bean is a 30 y.o. female presenting for cervical ripening then IOL for mild preeclampsia, now at 37wk.  The pt had elevated Pr/Cr ratios of 0.477 late Feb, and has now reached 37 wk. She has had slow progression of headaches. Current BP was 140/ 90, improving with rest.. Cervix is unfavorable , so a slow IOL with cytotec cervical ripening is needed. Marland Kitchen. History OB History    Gravida Para Term Preterm AB TAB SAB Ectopic Multiple Living   1         0     Past Medical History  Diagnosis Date  . Asthma   . Anemia   . Migraine   . Vaginal discharge during pregnancy in second trimester 01/06/2015  . Trichimoniasis 01/06/2015  . Round ligament pain 01/06/2015  . Hematuria 01/06/2015  . Urinary frequency 01/06/2015  . Pregnancy induced hypertension    Past Surgical History  Procedure Laterality Date  . No past surgeries     Family History: family history includes Cancer in her father and mother; Diabetes in her father and paternal grandmother; Heart disease in her father and paternal grandmother; Hypertension in her father and paternal grandmother; Thyroid disease in her father. Social History:  reports that she has been smoking Cigarettes.  She has a 2.5 pack-year smoking history. She has never used smokeless tobacco. She reports that she does not drink alcohol or use illicit drugs.   Prenatal Transfer Tool  Maternal Diabetes: No Genetic Screening: Normal Maternal Ultrasounds/Referrals: Normal Fetal Ultrasounds or other Referrals:  None Maternal Substance Abuse:  No Significant Maternal Medications:  None Significant Maternal Lab Results:  Lab values include: Group B Strep positive Other Comments:  None  Review of Systems  Eyes: Positive for blurred vision.  Respiratory: Negative.   Cardiovascular: Negative.   Gastrointestinal:       Left upper abdominal discomfort , no RuQ discomfort  Genitourinary: Negative.   Neurological: Positive for headaches.      Last  menstrual period 09/01/2014. Exam Physical Exam  Constitutional: She appears well-developed and well-nourished.  HENT:  Head: Normocephalic.  Neck: Normal range of motion.  Respiratory: Effort normal.  GI: Soft.  Gravid uterus EFW 7 lb  Genitourinary:  Cervix long posterior , vertex applied.    Prenatal labs: ABO, Rh: A/Positive/-- (08/22 1126) Antibody: Negative (01/03 0902) Rubella: 1.69 (08/22 1126) RPR: Non Reactive (01/03 0902)  HBsAg: Negative (08/22 1126)  HIV: Non Reactive (01/03 0902)  GBS:   pos in urine early in pregnancy  Assessment/Plan: Pregnancy 37 wk , mild PreE, for IOL at 37 wk.   Andreus Cure V 05/17/2015, 1:43 PM

## 2015-05-17 NOTE — Progress Notes (Signed)
Pt denies any problems or concerns at this time.  

## 2015-05-18 ENCOUNTER — Inpatient Hospital Stay (HOSPITAL_COMMUNITY)
Admission: RE | Admit: 2015-05-18 | Discharge: 2015-05-21 | DRG: 775 | Disposition: A | Discharge status: Home / Self Care | Payer: Medicaid Other | Source: Ambulatory Visit | Attending: Family Medicine | Admitting: Family Medicine

## 2015-05-18 ENCOUNTER — Encounter (HOSPITAL_COMMUNITY): Payer: Self-pay

## 2015-05-18 VITALS — BP 118/84 | HR 57 | Temp 98.0°F | Resp 18 | Ht 62.0 in | Wt 204.0 lb

## 2015-05-18 DIAGNOSIS — Z3A37 37 weeks gestation of pregnancy: Secondary | ICD-10-CM | POA: Diagnosis not present

## 2015-05-18 DIAGNOSIS — O9952 Diseases of the respiratory system complicating childbirth: Secondary | ICD-10-CM | POA: Diagnosis present

## 2015-05-18 DIAGNOSIS — Z8249 Family history of ischemic heart disease and other diseases of the circulatory system: Secondary | ICD-10-CM | POA: Diagnosis not present

## 2015-05-18 DIAGNOSIS — M79602 Pain in left arm: Secondary | ICD-10-CM | POA: Diagnosis not present

## 2015-05-18 DIAGNOSIS — J45909 Unspecified asthma, uncomplicated: Secondary | ICD-10-CM | POA: Diagnosis present

## 2015-05-18 DIAGNOSIS — O1493 Unspecified pre-eclampsia, third trimester: Secondary | ICD-10-CM

## 2015-05-18 DIAGNOSIS — Z833 Family history of diabetes mellitus: Secondary | ICD-10-CM

## 2015-05-18 DIAGNOSIS — M79605 Pain in left leg: Secondary | ICD-10-CM

## 2015-05-18 DIAGNOSIS — M7989 Other specified soft tissue disorders: Secondary | ICD-10-CM

## 2015-05-18 DIAGNOSIS — F17210 Nicotine dependence, cigarettes, uncomplicated: Secondary | ICD-10-CM | POA: Diagnosis present

## 2015-05-18 DIAGNOSIS — O99334 Smoking (tobacco) complicating childbirth: Secondary | ICD-10-CM | POA: Diagnosis present

## 2015-05-18 DIAGNOSIS — Z6837 Body mass index (BMI) 37.0-37.9, adult: Secondary | ICD-10-CM | POA: Diagnosis not present

## 2015-05-18 DIAGNOSIS — O99214 Obesity complicating childbirth: Secondary | ICD-10-CM | POA: Diagnosis present

## 2015-05-18 DIAGNOSIS — O1404 Mild to moderate pre-eclampsia, complicating childbirth: Principal | ICD-10-CM | POA: Diagnosis present

## 2015-05-18 DIAGNOSIS — O99824 Streptococcus B carrier state complicating childbirth: Secondary | ICD-10-CM | POA: Diagnosis present

## 2015-05-18 HISTORY — DX: Personal history of other specified conditions: Z87.898

## 2015-05-18 LAB — TYPE AND SCREEN
ABO/RH(D): A POS
ANTIBODY SCREEN: NEGATIVE

## 2015-05-18 LAB — COMPREHENSIVE METABOLIC PANEL
ALBUMIN: 2.8 g/dL — AB (ref 3.5–5.0)
ALK PHOS: 102 U/L (ref 38–126)
ALT: 15 U/L (ref 14–54)
AST: 17 U/L (ref 15–41)
Anion gap: 7 (ref 5–15)
BILIRUBIN TOTAL: 0.9 mg/dL (ref 0.3–1.2)
BUN: 9 mg/dL (ref 6–20)
CALCIUM: 8 mg/dL — AB (ref 8.9–10.3)
CO2: 23 mmol/L (ref 22–32)
Chloride: 108 mmol/L (ref 101–111)
Creatinine, Ser: 0.63 mg/dL (ref 0.44–1.00)
GFR calc Af Amer: >60 mL/min (ref >60)
GLUCOSE: 96 mg/dL (ref 65–99)
Potassium: 3.2 mmol/L — ABNORMAL LOW (ref 3.5–5.1)
SODIUM: 138 mmol/L (ref 135–145)
TOTAL PROTEIN: 6.1 g/dL — AB (ref 6.5–8.1)

## 2015-05-18 LAB — CBC
HEMATOCRIT: 27.7 % — AB (ref 36.0–46.0)
HEMOGLOBIN: 9.4 g/dL — AB (ref 12.0–15.0)
MCH: 30.3 pg (ref 26.0–34.0)
MCHC: 33.9 g/dL (ref 30.0–36.0)
MCV: 89.4 fL (ref 78.0–100.0)
Platelets: 214 10*3/uL (ref 150–400)
RBC: 3.1 MIL/uL — ABNORMAL LOW (ref 3.87–5.11)
RDW: 14 % (ref 11.5–15.5)
WBC: 8.7 10*3/uL (ref 4.0–10.5)

## 2015-05-18 LAB — URINE MICROSCOPIC-ADD ON: BACTERIA UA: NONE SEEN

## 2015-05-18 LAB — URINALYSIS, ROUTINE W REFLEX MICROSCOPIC
Bilirubin Urine: NEGATIVE
GLUCOSE, UA: NEGATIVE mg/dL
Ketones, ur: NEGATIVE mg/dL
LEUKOCYTES UA: NEGATIVE
NITRITE: NEGATIVE
PH: 6 (ref 5.0–8.0)
Protein, ur: 30 mg/dL — AB
SPECIFIC GRAVITY, URINE: 1.02 (ref 1.005–1.030)

## 2015-05-18 LAB — PROTEIN / CREATININE RATIO, URINE
CREATININE, URINE: 142 mg/dL
PROTEIN CREATININE RATIO: 0.48 mg/mg{creat} — AB (ref 0.00–0.15)
TOTAL PROTEIN, URINE: 68 mg/dL

## 2015-05-18 LAB — RPR: RPR Ser Ql: NONREACTIVE

## 2015-05-18 LAB — ABO/RH: ABO/RH(D): A POS

## 2015-05-18 MED ORDER — ACETAMINOPHEN 325 MG PO TABS
650.0000 mg | ORAL_TABLET | Freq: Four times a day (QID) | ORAL | Status: DC | PRN
  Administered 2015-05-18 (×2): 650 mg via ORAL
  Filled 2015-05-18 (×2): qty 2

## 2015-05-18 MED ORDER — ZOLPIDEM TARTRATE 5 MG PO TABS
5.0000 mg | ORAL_TABLET | Freq: Every evening | ORAL | Status: DC | PRN
  Administered 2015-05-18: 5 mg via ORAL
  Filled 2015-05-18: qty 1

## 2015-05-18 MED ORDER — ONDANSETRON HCL 4 MG/2ML IJ SOLN
4.0000 mg | Freq: Four times a day (QID) | INTRAMUSCULAR | Status: DC | PRN
  Filled 2015-05-18: qty 2

## 2015-05-18 MED ORDER — ONDANSETRON HCL 4 MG/2ML IJ SOLN
4.0000 mg | Freq: Four times a day (QID) | INTRAMUSCULAR | Status: DC | PRN
  Administered 2015-05-19: 4 mg via INTRAVENOUS

## 2015-05-18 MED ORDER — LIDOCAINE HCL (PF) 1 % IJ SOLN
30.0000 mL | INTRAMUSCULAR | Status: DC | PRN
  Filled 2015-05-18: qty 30

## 2015-05-18 MED ORDER — LACTATED RINGERS IV SOLN
INTRAVENOUS | Status: DC
Start: 2015-05-18 — End: 2015-05-20
  Administered 2015-05-18 (×3): via INTRAVENOUS

## 2015-05-18 MED ORDER — FENTANYL CITRATE (PF) 100 MCG/2ML IJ SOLN
100.0000 ug | INTRAMUSCULAR | Status: DC | PRN
  Administered 2015-05-18 – 2015-05-19 (×10): 100 ug via INTRAVENOUS
  Filled 2015-05-18 (×10): qty 2

## 2015-05-18 MED ORDER — OXYTOCIN BOLUS FROM INFUSION
500.0000 mL | INTRAVENOUS | Status: DC
  Administered 2015-05-19: 500 mL via INTRAVENOUS

## 2015-05-18 MED ORDER — OXYCODONE-ACETAMINOPHEN 5-325 MG PO TABS: 2.0000 | ORAL_TABLET | ORAL | Status: DC | PRN

## 2015-05-18 MED ORDER — PENICILLIN G POTASSIUM 5000000 UNITS IJ SOLR
2.5000 10*6.[IU] | INTRAMUSCULAR | Status: DC
  Administered 2015-05-18 – 2015-05-19 (×10): 2.5 10*6.[IU] via INTRAVENOUS
  Filled 2015-05-18 (×19): qty 2.5

## 2015-05-18 MED ORDER — OXYCODONE-ACETAMINOPHEN 5-325 MG PO TABS: 1.0000 | ORAL_TABLET | ORAL | Status: DC | PRN

## 2015-05-18 MED ORDER — FLEET ENEMA 7-19 GM/118ML RE ENEM: 1.0000 | ENEMA | RECTAL | Status: DC | PRN

## 2015-05-18 MED ORDER — OXYTOCIN 10 UNIT/ML IJ SOLN
2.5000 [IU]/h | INTRAVENOUS | Status: DC
  Filled 2015-05-18: qty 10

## 2015-05-18 MED ORDER — CITRIC ACID-SODIUM CITRATE 334-500 MG/5ML PO SOLN: 30.0000 mL | ORAL | Status: DC | PRN

## 2015-05-18 MED ORDER — DEXTROSE 5 % IV SOLN
5.0000 10*6.[IU] | Freq: Once | INTRAVENOUS | Status: AC
  Administered 2015-05-18: 5 10*6.[IU] via INTRAVENOUS
  Filled 2015-05-18: qty 5

## 2015-05-18 MED ORDER — LACTATED RINGERS IV SOLN: 500.0000 mL | INTRAVENOUS | Status: DC | PRN

## 2015-05-18 MED ORDER — TERBUTALINE SULFATE 1 MG/ML IJ SOLN
0.2500 mg | Freq: Once | INTRAMUSCULAR | Status: DC | PRN
  Filled 2015-05-18: qty 1

## 2015-05-18 MED ORDER — MISOPROSTOL 200 MCG PO TABS
50.0000 ug | ORAL_TABLET | ORAL | Status: DC
  Administered 2015-05-18 – 2015-05-19 (×2): 50 ug via ORAL
  Filled 2015-05-18 (×2): qty 0.5

## 2015-05-18 MED ORDER — MISOPROSTOL 25 MCG QUARTER TABLET
25.0000 ug | ORAL_TABLET | ORAL | Status: AC
  Administered 2015-05-18 (×3): 25 ug via VAGINAL
  Filled 2015-05-18 (×4): qty 0.25

## 2015-05-18 NOTE — Progress Notes (Signed)
LABOR PROGRESS NOTE  Natasha Bean is a 30 y.o. G1P0 at 6352w0d  admitted for preE w/o severe features  Subjective: Moderately painful frequent contractions  Objective: BP 136/78 mmHg  Pulse 67  Temp(Src) 98.5 F (36.9 C) (Oral)  Resp 17  Ht 5\' 2"  (1.575 m)  Wt 204 lb (92.534 kg)  BMI 37.30 kg/m2  LMP 09/01/2014 or  Filed Vitals:   05/18/15 1630 05/18/15 1700 05/18/15 1730 05/18/15 1800  BP: 131/78 130/69 149/82 136/78  Pulse: 73 68 67 67  Temp:      TempSrc:      Resp: 15 18 16 17   Height:      Weight:        140/mod/+a/-d  Dilation: 1 Effacement (%): Thick Cervical Position: Posterior Station: -3 Presentation: Vertex Exam by:: Dr. Ashok PallWouk  Labs: Lab Results  Component Value Date   WBC 8.7 05/18/2015   HGB 9.4* 05/18/2015   HCT 27.7* 05/18/2015   MCV 89.4 05/18/2015   PLT 214 05/18/2015    Patient Active Problem List   Diagnosis Date Noted  . Mild preeclampsia 05/18/2015  . Preeclampsia 05/03/2015  . Fetal echogenic intracardiac focus on prenatal ultrasound 03/22/2015  . Marijuana use 02/14/2015  . Anemia affecting pregnancy, antepartum 02/14/2015  . Hematuria 01/06/2015  . Supervision of other high-risk pregnancy 11/01/2014  . Trichomonal vaginitis during pregnancy in first trimester 10/28/2014    Assessment / Plan: 30 y.o. G1P0 at 5852w0d here for iol 2/2 preE w/o severe features  Labor: now w/p Foley. ctxns strong and frequent - holding on more cytotec for the time being Fetal Wellbeing:  Cat 1 Pain Control:  fentanyl Anticipated MOD:  Vag PreE: no severe features. Expectant mgmt  Natasha BilisNoah B Javoris Star, MD 05/18/2015, 6:34 PM

## 2015-05-18 NOTE — Consults (Signed)
  Anesthesia Pain Consult Note  Patient: Natasha Bean, 30 y.o., female  Consult Requested by: Tilda BurrowJohn Ferguson V, MD  Reason for Consult Anesthesia pain rounds Pt. Receiving IV Fentanyl for discomfort. States she is planning a epidural when she begins to progress in labor. States her accepted pain level to be 5/10 with her pain level currently a 2/10.     Edison PaceWILKERSON,Eugene Isadore 05/18/2015

## 2015-05-18 NOTE — H&P (Signed)
HPI: Natasha Bean is a 30 y.o. year old G1P0 female at [redacted]w[redacted]d weeks gestation who presents to [facility] for IOL for Preeclampsia w/out severe features.    [facility]  Initiated Care at   11/01/14  FOB  Darrell Carole Binning 30 yo BM 5th  Dating By   LMP and [place]  Pap  11/01/14 negative -HPV +trich  GC/CT Initial:       -/-         36+wks:  Genetic Screen NT/IT: neg  CF screen   negative  Anatomic [place] Female, Lt EICF 2.2 mm @ 28 weeks: still present, stable  Flu vaccine 02/14/2015   Tdap Recommended ~ 28wks  Glucose Screen  2 hr normal: 74/114/94  GBS Pos urine  Feed Preference breast  Contraception Depo  Circumcision n/a  Childbirth Classes Interested, info given  Pediatrician Undecided, info given   Patient Active Problem List   Diagnosis Date Noted  . Mild preeclampsia 05/18/2015  . Preeclampsia 05/03/2015  . Fetal echogenic intracardiac focus on prenatal ultrasound 03/22/2015  . Marijuana use 02/14/2015  . Anemia affecting pregnancy, antepartum 02/14/2015  . Hematuria 01/06/2015  . Supervision of other high-risk pregnancy 11/01/2014  . Trichomonal vaginitis during pregnancy in first trimester 10/28/2014   Maternal Medical History:  Contractions: Frequency: irregular.   Duration is approximately 80 seconds.   Perceived severity is mild.    Fetal activity: Perceived fetal activity is normal.   Last perceived fetal movement was within the past hour.    Prenatal complications: Pre-eclampsia.     OB History    Gravida Para Term Preterm AB TAB SAB Ectopic Multiple Living   1         0     Past Medical History  Diagnosis Date  . Asthma   . Anemia   . Migraine   . Vaginal discharge during pregnancy in second trimester 01/06/2015  . Trichimoniasis 01/06/2015    Repeat neg- 01/17/15  . Round ligament pain 01/06/2015  . Hematuria 01/06/2015  . Urinary frequency 01/06/2015  . Pregnancy induced hypertension   . History of marijuana use 11/01/2014    Repeat  UDS- Neg (04/19/2015)   Past Surgical History  Procedure Laterality Date  . No past surgeries     Family History: family history includes Cancer in her father and mother; Diabetes in her father and paternal grandmother; Heart disease in her father and paternal grandmother; Hypertension in her father and paternal grandmother; Thyroid disease in her father. Social History:  reports that she has been smoking Cigarettes.  She has a 2.5 pack-year smoking history. She has never used smokeless tobacco. She reports that she does not drink alcohol or use illicit drugs.   Prenatal Transfer Tool  Maternal Diabetes: No Genetic Screening: Normal Maternal Ultrasounds/Referrals: Abnormal:  Findings:   Isolated EIF (echogenic intracardiac focus) Fetal Ultrasounds or other Referrals:  None Maternal Substance Abuse:  Yes:  Type: Marijuana Significant Maternal Medications:  None Significant Maternal Lab Results:  Lab values include: Group B Strep positive, Other:  Other Comments:  Positive trich, pre-eclamptic  Review of Systems  Eyes: Negative for blurred vision.  Cardiovascular: Positive for leg swelling.  Gastrointestinal: Negative for abdominal pain.  Neurological: Positive for headaches.    Dilation: Closed Exam by:: Dorathy Kinsman, CNM Blood pressure 138/82, pulse 72, temperature 98.2 F (36.8 C), temperature source Oral, resp. rate 16, height  (1.575 m), weight 204 lb (92.534 kg), last menstrual period 09/01/2014. Maternal Exam:  Uterine Assessment: Contraction strength is mild.  Contraction duration is 80 seconds. Contraction frequency is irregular.   Abdomen: Patient reports no abdominal tenderness. Fetal presentation: vertex  Introitus: Vagina is negative for discharge.  Cervix: Cervix evaluated by digital exam.     Fetal Exam Fetal Monitor Review: Mode: ultrasound.   Baseline rate: 120.  Variability: moderate (6-25 bpm).   Pattern: accelerations present and no decelerations.     Fetal State Assessment: Category I - tracings are normal.     Physical Exam  Nursing note and vitals reviewed. Constitutional: She is oriented to person, place, and time. She appears well-developed and well-nourished. No distress.  HENT:  Head: Atraumatic.  Eyes: Conjunctivae are normal.  Cardiovascular: Normal rate, regular rhythm and normal heart sounds.   Respiratory: Effort normal and breath sounds normal. No respiratory distress.  GI: Soft. Bowel sounds are normal.  Genitourinary: Vagina normal. No vaginal discharge found.  Musculoskeletal: She exhibits edema.  Neurological: She is alert and oriented to person, place, and time.  Skin: Skin is warm and dry.  Psychiatric: She has a normal mood and affect.    Prenatal labs: ABO, Rh: --/--/A POS (03/08 0055) Antibody: NEG (03/08 0055) Rubella: 1.69 (08/22 1126) RPR: Non Reactive (01/03 0902)  HBsAg: Negative (08/22 1126)  HIV: Non Reactive (01/03 0902)  GBS:  Positive urine.  Assessment: 1. Labor: IOL  2. Fetal Wellbeing: Category I  3. Pain Control: None 4. GBS: Positive urine 5. 37.0 week IUP 6. Pre-E. Spots seen randomly when sitting (not when rising to standing position) suggest possible scotoma. However, doubtful that this is true scotoma due to severe pre-eclampsia given only mildly elevated bp (max 143/87) .  Plan:  1. Admit to BS per consult with MD 2. Routine L&D orders 3. Analgesia/anesthesia PRN  4. Pre-E labs. Discuss vision changes and labs with Dr. Debroah LoopArnold. No magnesium sulfate for now but will observe closely and plan for magnesium postpartum.  5. GBS: Penicillin G potassium 2.5 million units in dextrose 5% 100 mL IV q4hrs ordered 6. IOL: Cytotec tablet 25 mcg VA q4hrs placed  ArcadiaSMITH, Press Casale 05/18/2015, 3:54 AM

## 2015-05-19 ENCOUNTER — Inpatient Hospital Stay (HOSPITAL_COMMUNITY): Payer: Medicaid Other | Admitting: Anesthesiology

## 2015-05-19 DIAGNOSIS — F17210 Nicotine dependence, cigarettes, uncomplicated: Secondary | ICD-10-CM

## 2015-05-19 DIAGNOSIS — O99334 Smoking (tobacco) complicating childbirth: Secondary | ICD-10-CM

## 2015-05-19 DIAGNOSIS — O1404 Mild to moderate pre-eclampsia, complicating childbirth: Secondary | ICD-10-CM

## 2015-05-19 DIAGNOSIS — O99214 Obesity complicating childbirth: Secondary | ICD-10-CM

## 2015-05-19 DIAGNOSIS — Z3A37 37 weeks gestation of pregnancy: Secondary | ICD-10-CM

## 2015-05-19 DIAGNOSIS — O99824 Streptococcus B carrier state complicating childbirth: Secondary | ICD-10-CM

## 2015-05-19 DIAGNOSIS — O9952 Diseases of the respiratory system complicating childbirth: Secondary | ICD-10-CM

## 2015-05-19 LAB — CBC
HEMATOCRIT: 29.7 % — AB (ref 36.0–46.0)
Hemoglobin: 9.9 g/dL — ABNORMAL LOW (ref 12.0–15.0)
MCH: 30.1 pg (ref 26.0–34.0)
MCHC: 33.3 g/dL (ref 30.0–36.0)
MCV: 90.3 fL (ref 78.0–100.0)
PLATELETS: 174 10*3/uL (ref 150–400)
RBC: 3.29 MIL/uL — AB (ref 3.87–5.11)
RDW: 13.9 % (ref 11.5–15.5)
WBC: 12.5 10*3/uL — AB (ref 4.0–10.5)

## 2015-05-19 LAB — GC/CHLAMYDIA PROBE AMP
Chlamydia trachomatis, NAA: NEGATIVE
Neisseria gonorrhoeae by PCR: NEGATIVE

## 2015-05-19 LAB — STREP GP B NAA: STREP GROUP B AG: NEGATIVE

## 2015-05-19 LAB — RUBELLA SCREEN: RUBELLA: 1.28 {index} (ref >0.99)

## 2015-05-19 MED ORDER — TERBUTALINE SULFATE 1 MG/ML IJ SOLN
0.2500 mg | Freq: Once | INTRAMUSCULAR | Status: DC | PRN
  Filled 2015-05-19: qty 1

## 2015-05-19 MED ORDER — EPHEDRINE 5 MG/ML INJ
10.0000 mg | INTRAVENOUS | Status: DC | PRN
  Filled 2015-05-19: qty 2

## 2015-05-19 MED ORDER — FENTANYL 2.5 MCG/ML BUPIVACAINE 1/10 % EPIDURAL INFUSION (WH - ANES)
INTRAMUSCULAR | Status: DC | PRN
  Administered 2015-05-19: 14 mL/h via EPIDURAL

## 2015-05-19 MED ORDER — FENTANYL 2.5 MCG/ML BUPIVACAINE 1/10 % EPIDURAL INFUSION (WH - ANES)
14.0000 mL/h | INTRAMUSCULAR | Status: DC | PRN
  Administered 2015-05-19: 14 mL/h via EPIDURAL
  Filled 2015-05-19 (×2): qty 125

## 2015-05-19 MED ORDER — LACTATED RINGERS IV SOLN
500.0000 mL | Freq: Once | INTRAVENOUS | Status: AC
  Administered 2015-05-19: 500 mL via INTRAVENOUS

## 2015-05-19 MED ORDER — OXYTOCIN 10 UNIT/ML IJ SOLN
1.0000 m[IU]/min | INTRAMUSCULAR | Status: DC
  Administered 2015-05-19: 2 m[IU]/min via INTRAVENOUS

## 2015-05-19 MED ORDER — DIPHENHYDRAMINE HCL 50 MG/ML IJ SOLN: 12.5000 mg | INTRAMUSCULAR | Status: DC | PRN

## 2015-05-19 MED ORDER — LIDOCAINE HCL (PF) 1 % IJ SOLN
INTRAMUSCULAR | Status: DC | PRN
  Administered 2015-05-19 (×2): 4 mL

## 2015-05-19 MED ORDER — PHENYLEPHRINE 40 MCG/ML (10ML) SYRINGE FOR IV PUSH (FOR BLOOD PRESSURE SUPPORT)
80.0000 ug | PREFILLED_SYRINGE | INTRAVENOUS | Status: DC | PRN
  Filled 2015-05-19: qty 2

## 2015-05-19 MED ORDER — PHENYLEPHRINE 40 MCG/ML (10ML) SYRINGE FOR IV PUSH (FOR BLOOD PRESSURE SUPPORT)
80.0000 ug | PREFILLED_SYRINGE | INTRAVENOUS | Status: DC | PRN
  Filled 2015-05-19: qty 2
  Filled 2015-05-19: qty 20

## 2015-05-19 NOTE — Progress Notes (Signed)
   Natasha Bean is a 30 y.o. G1P0 at 5121w1d  admitted for induction of labor due to Pre-eclamptic toxemia of pregnancy..  Subjective:  Comfortable with epidural Objective: Filed Vitals:   05/19/15 1801 05/19/15 1830 05/19/15 1900 05/19/15 1931  BP: 139/85 145/81 149/88 138/74  Pulse: 65 69 69 68  Temp:    98.3 F (36.8 C)  TempSrc:    Oral  Resp: 20     Height:      Weight:          FHT:  FHR: 140 bpm, variability: moderate,  accelerations:  Present,  decelerations:  Absent UC:   irregular, every 1-3 minutes; MVU's 200-26140mmHg SVE:   Dilation: 5.5 Effacement (%): 90 Station: -1 Exam by:: Cres- Dishmon, CNM Pitocin @ 16 mu/min  Labs: Lab Results  Component Value Date   WBC 12.5* 05/19/2015   HGB 9.9* 05/19/2015   HCT 29.7* 05/19/2015   MCV 90.3 05/19/2015   PLT 174 05/19/2015    Assessment / Plan: Induction of labor due to prereclampsia,  progressing well on pitocin AROM by Dr. Penne LashLeggett about 2 hours ago and IUPC placed then.  It has been a struggle to get pt into adequate labor Labor: slow progress, but labor is now adequate Fetal Wellbeing:  Category I Pain Control:  Epidural Anticipated MOD:  NSVD  CRESENZO-DISHMAN,Velna Hedgecock 05/19/2015, 7:36 PM

## 2015-05-19 NOTE — Progress Notes (Signed)
Natasha Bean is a 30 y.o. G1P0 at 5261w1d by LMP and US admitted for induction of labor due to pre-eclampsia without severe features.  Subjective: Late note:  Patient feeling contractions still but is getting Fentanyl. Is able to lay on side and rest. Foley still in.    Objective: BP 143/86 mmHg  Pulse 69  Temp(Src) 98.1 F (36.7 C) (Axillary)  Resp 18  Ht 5\' 2"  (1.575 m)  Wt 92.534 kg (204 lb)  BMI 37.30 kg/m2  LMP 09/01/2014     FHT:  FHR: 130 bpm, variability: moderate,  accelerations:  Present,  decelerations:  Absent UC:   regular, every 1-5 minutes SVE:   Dilation: 1 Effacement (%): Thick (high) Station: -3 Exam by:: M.Merrill, RN  Labs: Lab Results  Component Value Date   WBC 8.7 05/18/2015   HGB 9.4* 05/18/2015   HCT 27.7* 05/18/2015   MCV 89.4 05/18/2015   PLT 214 05/18/2015    Assessment / Plan: 30 y.o. G1P0 at 3313w0d here for Induction of labor due to preeclampsia w/o severe features  Labor: Progressing,  s/p Cytotec and now has foley bulb. Will re-evaluate once foley bulb comes out Preeclampsia:  no signs or symptoms of toxicity Fetal Wellbeing:  Category I Pain Control:  IV pain meds I/D:  GBS pos. Getting PCN Anticipated MOD:  NSVD  Natasha Bean 05/19/2015, 12:35 AM   I have seen and examined this patient and I agree with the above. Continuing w/ buccal cytotec during the night. Natasha Bean, Natasha Bean CNM 4:28 AM 05/19/2015

## 2015-05-19 NOTE — Anesthesia Preprocedure Evaluation (Signed)
Anesthesia Evaluation  Patient identified by MRN, date of birth, ID band Patient awake and Patient confused    Reviewed: Allergy & Precautions, H&P , NPO status , Patient's Chart, lab work & pertinent test results  Airway Mallampati: II       Dental  (+) Teeth Intact   Pulmonary Current Smoker,    Pulmonary exam normal breath sounds clear to auscultation       Cardiovascular Exercise Tolerance: Good hypertension, Normal cardiovascular exam Rhythm:regular Rate:Normal  PIH   Neuro/Psych    GI/Hepatic   Endo/Other  Morbid obesity  Renal/GU      Musculoskeletal   Abdominal   Peds  Hematology  (+) anemia , plts 172 05/19/15   Anesthesia Other Findings   Reproductive/Obstetrics (+) Pregnancy PIH                             Anesthesia Physical Anesthesia Plan  ASA: III  Anesthesia Plan: Epidural   Post-op Pain Management:    Induction:   Airway Management Planned:   Additional Equipment:   Intra-op Plan:   Post-operative Plan:   Informed Consent: I have reviewed the patients History and Physical, chart, labs and discussed the procedure including the risks, benefits and alternatives for the proposed anesthesia with the patient or authorized representative who has indicated his/her understanding and acceptance.     Plan Discussed with:   Anesthesia Plan Comments:         Anesthesia Quick Evaluation

## 2015-05-19 NOTE — Progress Notes (Signed)
Patient ID: Natasha NestleChristy A Kaneshiro, female   DOB: 11/15/1985, 30 y.o.   MRN: [id number]  Pt checked at 10:20 pm 9.5/100/0 Category 1 tracing.  Continue pitocin.  LEGGETT,KELLY H.

## 2015-05-19 NOTE — Consults (Signed)
  Anesthesia Pain Consult Note  Patient: Natasha Bean, 30 y.o., female  Consult Requested by: Tilda BurrowJohn Ferguson V, MD  Reason for Consult: CRNA Pain Rounds  Level of Consciousness: alert  Pain: 6  Pain Goal: 8

## 2015-05-19 NOTE — Progress Notes (Signed)
Natasha Bean is a 30 y.o. G1P0 at 167w1d  admitted for induction of labor due to Pre-eclamptic toxemia of pregnancy..  Subjective: Feeling crampy; had PO cytotec x 2 doses tonight; desires shower and a light breakfast  Objective: BP 138/76 mmHg  Pulse 62  Temp(Src) 98.1 F (36.7 C) (Axillary)  Resp 18  Ht 5\' 2"  (1.575 m)  Wt 92.534 kg (204 lb)  BMI 37.30 kg/m2  LMP 09/01/2014      FHT:  FHR: 130s bpm, variability: moderate,  accelerations:  Present,  decelerations:  Absent UC:   irreg SVE:   Dilation: 3 Effacement (%): 70 Station: -3 Exam by:: Natasha BadderK. Bean, [name] still in place, held by a rim of cx  Labs: Lab Results  Component Value Date   WBC 8.7 05/18/2015   HGB 9.4* 05/18/2015   HCT 27.7* 05/18/2015   MCV 89.4 05/18/2015   PLT 214 05/18/2015    Assessment / Plan: IUP@37 .1wks Pre-e w/o severe features IOL process  Plan to shower and have light b-fast this morning; when balloon comes out will start Pitocin  Natasha Bean, Natasha Bean 05/19/2015, 5:34 AM

## 2015-05-19 NOTE — Anesthesia Procedure Notes (Signed)
Epidural Patient location during procedure: OB Start time: 05/19/2015 4:12 PM End time: 05/19/2015 4:30 PM  Staffing Anesthesiologist: Sebastian AcheMANNY, Sakai Wolford  Preanesthetic Checklist Completed: patient identified, site marked, surgical consent, pre-op evaluation, timeout performed, IV checked, risks and benefits discussed and monitors and equipment checked  Epidural Patient position: sitting Prep: site prepped and draped and DuraPrep Patient monitoring: heart rate, continuous pulse ox and blood pressure Approach: midline Location: L4-L5 Injection technique: LOR air  Needle:  Needle type: Tuohy  Needle gauge: 17 G Needle length: 9 cm and 9 Needle insertion depth: 6 cm Catheter type: closed end flexible Catheter size: 19 Gauge Catheter at skin depth: 14 cm Test dose: negative  Assessment Events: blood not aspirated, injection not painful, no injection resistance, negative IV test and no paresthesia  Additional Notes   Patient tolerated the insertion well without complications.Reason for block:procedure for pain

## 2015-05-19 NOTE — Progress Notes (Signed)
Natasha Bean is a 30 y.o. G1P0 at 6353w1d induce for gestational hypertension.  Subjective: Pt comfortable with epidural  Objective: BP 126/73 mmHg  Pulse 72  Temp(Src) 98.3 F (36.8 C) (Oral)  Resp 20  Ht 5\' 2"  (1.575 m)  Wt 204 lb (92.534 kg)  BMI 37.30 kg/m2  LMP 09/01/2014      FHT:  FHR: 120 bpm, variability: moderate,  accelerations:  Present,  decelerations:  Present variables mild UC:   regular, every 2-3 minutes SVE:   7/90/-2 Labs: Lab Results  Component Value Date   WBC 12.5* 05/19/2015   HGB 9.9* 05/19/2015   HCT 29.7* 05/19/2015   MCV 90.3 05/19/2015   PLT 174 05/19/2015    Assessment / Plan: made 1.5 cm chagne past hour.    Will recheck in 1-2 hours IUPC in place and adequate since placement.    Labor: long induction, no in active phase of laor Preeclampsia:  no signs or symptoms of toxicity Fetal Wellbeing:  Category I Pain Control:  Epidural I/D:  n/a Anticipated MOD:  NSVD  LEGGETT,KELLY H. 05/19/2015, 8:34 PM

## 2015-05-20 ENCOUNTER — Encounter (HOSPITAL_COMMUNITY): Payer: Self-pay

## 2015-05-20 ENCOUNTER — Other Ambulatory Visit: Payer: Medicaid Other

## 2015-05-20 ENCOUNTER — Encounter: Payer: Medicaid Other | Admitting: Obstetrics and Gynecology

## 2015-05-20 DIAGNOSIS — O139 Gestational [pregnancy-induced] hypertension without significant proteinuria, unspecified trimester: Secondary | ICD-10-CM

## 2015-05-20 LAB — CBC
HCT: 28 % — ABNORMAL LOW (ref 36.0–46.0)
HCT: 27.6 % — ABNORMAL LOW (ref 36.0–46.0)
Hemoglobin: 9.4 g/dL — ABNORMAL LOW (ref 12.0–15.0)
Hemoglobin: 9.5 g/dL — ABNORMAL LOW (ref 12.0–15.0)
MCH: 30.3 pg (ref 26.0–34.0)
MCH: 30.5 pg (ref 26.0–34.0)
MCHC: 33.6 g/dL (ref 30.0–36.0)
MCHC: 34.4 g/dL (ref 30.0–36.0)
MCV: 90.3 fL (ref 78.0–100.0)
MCV: 88.7 fL (ref 78.0–100.0)
Platelets: 210 10*3/uL (ref 150–400)
Platelets: 221 10*3/uL (ref 150–400)
RBC: 3.1 MIL/uL — ABNORMAL LOW (ref 3.87–5.11)
RBC: 3.11 MIL/uL — AB (ref 3.87–5.11)
RDW: 13.5 % (ref 11.5–15.5)
RDW: 14 % (ref 11.5–15.5)
WBC: 14.7 10*3/uL — ABNORMAL HIGH (ref 4.0–10.5)
WBC: 19.3 10*3/uL — AB (ref 4.0–10.5)

## 2015-05-20 LAB — CCBB MATERNAL DONOR DRAW

## 2015-05-20 MED ORDER — ONDANSETRON HCL 4 MG/2ML IJ SOLN: 4.0000 mg | INTRAMUSCULAR | Status: DC | PRN

## 2015-05-20 MED ORDER — LACTATED RINGERS IV SOLN: 500.0000 mL | INTRAVENOUS | Status: DC | PRN

## 2015-05-20 MED ORDER — SENNOSIDES-DOCUSATE SODIUM 8.6-50 MG PO TABS
2.0000 | ORAL_TABLET | ORAL | Status: DC
  Administered 2015-05-20: 2 via ORAL
  Filled 2015-05-20 (×2): qty 2

## 2015-05-20 MED ORDER — PENICILLIN G POTASSIUM 5000000 UNITS IJ SOLR: 2.5000 10*6.[IU] | INTRAMUSCULAR | Status: DC

## 2015-05-20 MED ORDER — PRENATAL MULTIVITAMIN CH
1.0000 | ORAL_TABLET | Freq: Every day | ORAL | Status: DC
  Administered 2015-05-20 – 2015-05-21 (×2): 1 via ORAL
  Filled 2015-05-20 (×2): qty 1

## 2015-05-20 MED ORDER — TETANUS-DIPHTH-ACELL PERTUSSIS 5-2.5-18.5 LF-MCG/0.5 IM SUSP: 0.5000 mL | Freq: Once | INTRAMUSCULAR | Status: DC

## 2015-05-20 MED ORDER — BISACODYL 10 MG RE SUPP
10.0000 mg | Freq: Every day | RECTAL | Status: DC | PRN
  Filled 2015-05-20: qty 1

## 2015-05-20 MED ORDER — LANOLIN HYDROUS EX OINT: TOPICAL_OINTMENT | CUTANEOUS | Status: DC | PRN

## 2015-05-20 MED ORDER — METHYLERGONOVINE MALEATE 0.2 MG PO TABS: 0.2000 mg | ORAL_TABLET | ORAL | Status: DC | PRN

## 2015-05-20 MED ORDER — BENZOCAINE-MENTHOL 20-0.5 % EX AERO
1.0000 | INHALATION_SPRAY | CUTANEOUS | Status: DC | PRN
Start: 2015-05-20 — End: 2015-05-21
  Administered 2015-05-20: 1 via TOPICAL
  Filled 2015-05-20: qty 56

## 2015-05-20 MED ORDER — OXYTOCIN 10 UNIT/ML IJ SOLN: 2.5000 [IU]/h | INTRAMUSCULAR | Status: DC

## 2015-05-20 MED ORDER — LACTATED RINGERS IV SOLN: INTRAVENOUS | Status: DC

## 2015-05-20 MED ORDER — IBUPROFEN 600 MG PO TABS
600.0000 mg | ORAL_TABLET | Freq: Four times a day (QID) | ORAL | Status: DC
  Administered 2015-05-20 – 2015-05-21 (×6): 600 mg via ORAL
  Filled 2015-05-20 (×6): qty 1

## 2015-05-20 MED ORDER — ONDANSETRON HCL 4 MG PO TABS: 4.0000 mg | ORAL_TABLET | ORAL | Status: DC | PRN

## 2015-05-20 MED ORDER — FLEET ENEMA 7-19 GM/118ML RE ENEM: 1.0000 | ENEMA | Freq: Every day | RECTAL | Status: DC | PRN

## 2015-05-20 MED ORDER — FERROUS SULFATE 325 (65 FE) MG PO TABS
325.0000 mg | ORAL_TABLET | Freq: Two times a day (BID) | ORAL | Status: DC
  Administered 2015-05-20 – 2015-05-21 (×3): 325 mg via ORAL
  Filled 2015-05-20 (×3): qty 1

## 2015-05-20 MED ORDER — DIBUCAINE 1 % RE OINT
1.0000 | TOPICAL_OINTMENT | RECTAL | Status: DC | PRN
Start: 2015-05-20 — End: 2015-05-21

## 2015-05-20 MED ORDER — SIMETHICONE 80 MG PO CHEW
80.0000 mg | CHEWABLE_TABLET | ORAL | Status: DC | PRN
  Filled 2015-05-20: qty 1

## 2015-05-20 MED ORDER — ZOLPIDEM TARTRATE 5 MG PO TABS: 5.0000 mg | ORAL_TABLET | Freq: Every evening | ORAL | Status: DC | PRN

## 2015-05-20 MED ORDER — OXYTOCIN BOLUS FROM INFUSION: 500.0000 mL | INTRAVENOUS | Status: DC

## 2015-05-20 MED ORDER — ACETAMINOPHEN 325 MG PO TABS: 650.0000 mg | ORAL_TABLET | ORAL | Status: DC | PRN

## 2015-05-20 MED ORDER — WITCH HAZEL-GLYCERIN EX PADS
1.0000 | MEDICATED_PAD | CUTANEOUS | Status: DC | PRN
Start: 2015-05-20 — End: 2015-05-21

## 2015-05-20 MED ORDER — MEASLES, MUMPS & RUBELLA VAC ~~LOC~~ INJ: 0.5000 mL | INJECTION | Freq: Once | SUBCUTANEOUS | Status: DC

## 2015-05-20 MED ORDER — METHYLERGONOVINE MALEATE 0.2 MG/ML IJ SOLN: 0.2000 mg | INTRAMUSCULAR | Status: DC | PRN

## 2015-05-20 MED ORDER — FENTANYL 2.5 MCG/ML BUPIVACAINE 1/10 % EPIDURAL INFUSION (WH - ANES): 14.0000 mL/h | INTRAMUSCULAR | Status: DC | PRN

## 2015-05-20 MED ORDER — DIPHENHYDRAMINE HCL 25 MG PO CAPS: 25.0000 mg | ORAL_CAPSULE | Freq: Four times a day (QID) | ORAL | Status: DC | PRN

## 2015-05-20 MED ORDER — CITRIC ACID-SODIUM CITRATE 334-500 MG/5ML PO SOLN: 30.0000 mL | ORAL | Status: DC | PRN

## 2015-05-20 MED ORDER — PENICILLIN G POTASSIUM 5000000 UNITS IJ SOLR: 5.0000 10*6.[IU] | Freq: Once | INTRAVENOUS | Status: DC

## 2015-05-20 MED ORDER — LIDOCAINE HCL (PF) 1 % IJ SOLN
30.0000 mL | INTRAMUSCULAR | Status: DC | PRN
  Filled 2015-05-20: qty 30

## 2015-05-20 MED ORDER — ACETAMINOPHEN 325 MG PO TABS
650.0000 mg | ORAL_TABLET | ORAL | Status: DC | PRN
  Administered 2015-05-20 (×3): 650 mg via ORAL
  Filled 2015-05-20 (×3): qty 2

## 2015-05-20 MED ORDER — OXYCODONE HCL 5 MG PO TABS
5.0000 mg | ORAL_TABLET | Freq: Four times a day (QID) | ORAL | Status: AC | PRN
  Administered 2015-05-20 – 2015-05-21 (×2): 5 mg via ORAL
  Filled 2015-05-20 (×2): qty 1

## 2015-05-20 NOTE — Progress Notes (Signed)
Post Partum Day 1 Subjective: no complaints, up ad lib, voiding and tolerating PO  Objective: Blood pressure 126/67, pulse 50, temperature 97.8 F (36.6 C), temperature source Oral, resp. rate 16, height 5\' 2"  (1.575 m), weight 204 lb (92.534 kg), last menstrual period 09/01/2014, SpO2 99 %, unknown if currently breastfeeding.  Physical Exam:  General: alert, cooperative and no distress Lochia: appropriate Uterine Fundus: firm, above umbilicus DVT Evaluation: No evidence of DVT seen on physical exam. Negative Homan's sign. No cords or calf tenderness. No significant calf/ankle edema.   Recent Labs  05/19/15 1553 05/20/15 0057  HGB 9.9* 9.4*  HCT 29.7* 28.0*    Assessment/Plan: Plan for discharge tomorrow, Breastfeeding and Lactation consult   LOS: 2 days   LEGGETT,KELLY H. 05/20/2015, 7:22 AM

## 2015-05-20 NOTE — Lactation Note (Signed)
This note was copied from a baby's chart. Lactation Consultation Note  Patient Name: Girl Lorre MunroeChristy Fruin JYNWG'NToday's Date: 05/20/2015 Reason for consult: Initial assessment  [facility] that she has decided to formula/bottle feed. Mom reports her nipples are sore. LC offered assist with latching to prevent further soreness, discussed pump/bottle feeding as an alternative. Mom declined. Advised to call for any questions/concerns.   Maternal Data    Feeding Feeding Type: Breast Milk Length of feed: 0 min (too sleepy to feed)  LATCH Score/Interventions Latch: Too sleepy or reluctant, no latch achieved, no sucking elicited. Intervention(s): Skin to skin;Waking techniques Intervention(s): Assist with latch;Adjust position  Audible Swallowing: None Intervention(s): Skin to skin;Hand expression (mom taught hand expression an demonstrated)  Type of Nipple: Flat  Comfort (Breast/Nipple): Soft / non-tender     Hold (Positioning): Assistance needed to correctly position infant at breast and maintain latch. Intervention(s): Position options;Breastfeeding basics reviewed;Support Pillows;Skin to skin  LATCH Score: 4  Lactation Tools Discussed/Used     Consult Status Consult Status: Complete    Alfred LevinsGranger, Trinda Harlacher Ann 05/20/2015, 11:37 AM

## 2015-05-20 NOTE — Progress Notes (Signed)
[facility].    CSW attempted to meet with MOB; however, she had numerous visitors in her room. MOB receptive to CSW offer to return at a later time.  Loleta BooksSarah Pearla Mckinny MSW, LCSW

## 2015-05-21 ENCOUNTER — Inpatient Hospital Stay (HOSPITAL_COMMUNITY): Payer: Medicaid Other

## 2015-05-21 DIAGNOSIS — M79605 Pain in left leg: Secondary | ICD-10-CM

## 2015-05-21 DIAGNOSIS — M79602 Pain in left arm: Secondary | ICD-10-CM

## 2015-05-21 MED ORDER — IBUPROFEN 600 MG PO TABS: 600.0000 mg | ORAL_TABLET | Freq: Four times a day (QID) | ORAL | Status: DC

## 2015-05-21 NOTE — Progress Notes (Signed)
Patient ID: Natasha Bean, female   DOB: 01/11/1986, 30 y.o.   MRN: [id number]   Venous doppler performed r/t pt complaint of pain in left leg and greater edema of left calf/ankle/foot.   Dopplers wnl, no evidence of DVT. Discharge pt to home.  See Discharge summary.

## 2015-05-21 NOTE — Discharge Instructions (Signed)

## 2015-05-21 NOTE — Clinical Social Work Maternal (Signed)
CLINICAL SOCIAL WORK MATERNAL/CHILD NOTE  Patient Details  Name: Natasha Bean MRN: [id number] Date of Birth: [birth year]  Date:  05/21/2015  Clinical Social Worker Initiating Note:  Loleta Books MSW, LCSW Date/ Time Initiated:  05/21/15/0915     Child's Name:  Ty'Lasia   Legal Guardian:  Neysa Bonito Capo and Atilano Median  Need for Interpreter:  None   Date of Referral:  05/19/15     Reason for Referral:  Current Substance Use/Substance Use During Pregnancy    Referral Source:  [facility]   Address:  [address] [place], [place] [address]  Phone number:  [phone]   Household Members:  Significant Other   Natural Supports (not living in the home):  Immediate Family, Extended Family   Professional Supports: None   Education:    N/A  Surveyor, quantity Resources:  Medicaid   Other Resources:  [facility]   Cultural/Religious Considerations Which May Impact Care:  None reported  Strengths:  Home prepared for child , Pediatrician chosen , Ability to meet basic needs    Risk Factors/Current Problems:   1. 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 +UDS for [facility] on 8/24, with repeat UDS negative. Infant's UDS is negative, and umbilical cord panel is pending.   Cognitive State:  Able to Concentrate , Alert , Insightful , Linear Thinking , Goal Oriented    Mood/Affect:  Bright , Happy , Comfortable , Calm    CSW Assessment:  CSW received request for consult due to MOB presenting with a history of THC.  MOB provided consent for the FOB and the infant's aunt to remain in the room during the assessment.  MOB presented as easily engaged and receptive to the visit. She was in a pleasant mood and displayed a full range in affect.  FOB was also easily engaged and participated in the assessment.   MOB openly discussed her childbirth experience, and described it as better than she had anticipated. She shared that she is transitioning well into  the postpartum period, but discussed normative feelings of motherhood feeling surreal. She reported that is used to be called "Aunt" since she has numerous nieces and nephews, and it does not yet feel real that she is now a mother.  MOB and FOB endorsed feelings of happiness and excitement secondary to the transition to parenthood. She shared that she felt the initial feelings of stress and being overwhelmed when she first learned that she was pregnant since it was unanticipated; however, discussed how she became excited as the pregnancy progressed. She shared that she and the FOB have strong family support, and that they have been able to prepare the home for the infant.  MOB denied history of mental health diagnoses, and denied mental health complications during the pregnancy. MOB and FOB presented as attentive and engaged as CSW provided education on perinatal mood and anxiety disorders. MOB acknowledged common symptoms, commonality of the diagnosis, and was receptive to discussing self-care activities to support her mental health. MOB to follow up with her medical provider if she notes onset of symptoms.   MOB confirmed history of THC use early in pregnancy. Per MOB, she ceased all THC use once she learned that she was pregnant. MOB shared that there were no difficulties in not engaging in use since she was motivated by the infant. MOB and FOB informed of hospital drug screen policy, and protocol for drug screening infant. MOB and FOB informed of infant's negative  UDS, and they expressed confidence that the umbilical cord will be negative.  MOB and FOB denied questions, concerns, or needs at this time. They expressed appreciation for the visit, and agreed to contact CSW if additional needs arise.  CSW Plan/Description:   1. Patient/Family Education-- perinatal mood and anxiety disorders , hospital drug screen policy 2. Infant's UDS is negative, and umbilical cord is pending. CSW to monitor toxicology  screen, and will refer to CPS if positive.  3. No Further Intervention Required/No Barriers to Discharge    Pervis HockingVenning, Tayanna Talford N, LCSW 05/21/2015, 10:08 AM

## 2015-05-21 NOTE — Discharge Summary (Signed)
OB Discharge Summary     Patient Name: Natasha Bean DOB: 02/14/1986 MRN: [id number]  Date of admission: 05/18/2015 Delivering MD: Jacklyn ShellRESENZO-DISHMON, FRANCES   Date of discharge: 05/21/2015  Admitting diagnosis: INDUCTION Intrauterine pregnancy: 3448w1d     Secondary diagnosis:  Active Problems:   Mild preeclampsia  Additional problems:  Patient Active Problem List   Diagnosis Date Noted  . Mild preeclampsia 05/18/2015  . Preeclampsia 05/03/2015  . Fetal echogenic intracardiac focus on prenatal ultrasound 03/22/2015  . Marijuana use 02/14/2015  . Anemia affecting pregnancy, antepartum 02/14/2015  . Hematuria 01/06/2015  . Supervision of other high-risk pregnancy 11/01/2014  . Trichomonal vaginitis during pregnancy in first trimester 10/28/2014       Discharge diagnosis: Term Pregnancy Delivered                                                                                                Post partum procedures:none  Augmentation: Pitocin  Complications: None  Hospital course:  Induction of Labor With Vaginal Delivery   30 y.o. yo G1P1001 at 2448w1d was admitted to the hospital 05/18/2015 for induction of labor.  Indication for induction: Preeclampsia.  Patient had an uncomplicated labor course as follows: Membrane Rupture Time/Date: 5:24 PM ,05/19/2015   Intrapartum Procedures: Episiotomy: None [1]                                         Lacerations:  Periurethral [8];1st degree [2]  Patient had delivery of a Viable infant.  Information for the patient's newborn:  Kelby FamFlippin, Girl Neysa BonitoChristy [[id number]][[id number]]  Delivery Method: Vaginal, Spontaneous Delivery (Filed from Delivery Summary)   05/19/2015  Details of delivery can be found in separate delivery note.  Patient had a routine postpartum course. Patient is discharged home 05/22/2015.  On PPD 2, there were some concerns for possible DVT as patient had increasing edema in left lower extremity with tenderness. Venous doppler U/S was  ordered and results were WNL. Patient was sent home with post-partum vaginal delivery instructions and Ibuprofen for pain.   Physical exam  Filed Vitals:   05/20/15 0621 05/20/15 1424 05/20/15 1815 05/21/15 0650  BP: 126/67 119/69 131/96 118/84  Pulse: 50 61 73 57  Temp: 97.8 F (36.6 C) 98.2 F (36.8 C) 97.9 F (36.6 C) 98 F (36.7 C)  TempSrc: Oral Oral Oral   Resp: 16 18 18 18   Height:      Weight:      SpO2:       General: alert, cooperative and no distress Lochia: appropriate Uterine Fundus: firm Incision: N/A DVT Evaluation: Calf/Ankle edema is present Left calf tenderness.   Labs: Lab Results  Component Value Date   WBC 19.3* 05/20/2015   HGB 9.5* 05/20/2015   HCT 27.6* 05/20/2015   MCV 88.7 05/20/2015   PLT 221 05/20/2015   CMP Latest Ref Rng 05/18/2015  Glucose 65 - 99 mg/dL 96  BUN 6 - 20 mg/dL 9  Creatinine 7.820.44 - 9.561.00 mg/dL 2.130.63  Sodium  135 - 145 mmol/L 138  Potassium 3.5 - 5.1 mmol/L 3.2(L)  Chloride 101 - 111 mmol/L 108  CO2 22 - 32 mmol/L 23  Calcium 8.9 - 10.3 mg/dL 8.0(L)  Total Protein 6.5 - 8.1 g/dL 6.1(L)  Total Bilirubin 0.3 - 1.2 mg/dL 0.9  Alkaline Phos 38 - 126 U/L 102  AST 15 - 41 U/L 17  ALT 14 - 54 U/L 15   Venous Doppler U/S WNL.   Discharge instruction: per After Visit Summary and "Baby and Me Booklet".  After visit meds:    Medication List    STOP taking these medications        acetaminophen 500 MG tablet  Commonly known as:  TYLENOL     multivitamin-prenatal 27-0.8 MG Tabs tablet      TAKE these medications        ferrous sulfate 325 (65 FE) MG tablet  Take 1 tablet (325 mg total) by mouth 2 (two) times daily with a meal.     ibuprofen 600 MG tablet  Commonly known as:  ADVIL,MOTRIN  Take 1 tablet (600 mg total) by mouth every 6 (six) hours.     pantoprazole 20 MG tablet  Commonly known as:  PROTONIX  Take 1 tablet (20 mg total) by mouth daily.        Diet: routine diet  Activity: Advance as tolerated.  Pelvic rest for 6 weeks.   Outpatient follow up:6 weeks Follow up Appt:Future Appointments Date Time Provider Department Center  05/24/2015 9:45 AM Cheral Marker, CNM [facility]   Follow up Visit:No Follow-up on file.  Postpartum contraception: Depo Provera  Newborn Data: Live born female  Birth Weight: 5 lb 11.7 oz (2600 g) APGAR: 9, 9  Baby Feeding: Breast Disposition:home with mother   05/21/2015 [name], MD  I have seen this patient and agree with the above resident's note.  LEFTWICH-KIRBY, Tru Rana Certified Nurse-Midwife

## 2015-05-21 NOTE — Progress Notes (Signed)
*  PRELIMINARY RESULTS* Vascular Ultrasound Left lower extremity venous duplex has been completed.  Preliminary findings: No evidence of DVT or baker's cyst.   Farrel DemarkJill Eunice, RDMS, RVT  05/21/2015, 10:50 AM

## 2015-05-21 NOTE — Progress Notes (Signed)
Post Partum Day 2 Subjective: up ad lib, voiding, tolerating PO and + flatus. Pt is complaining of some residual back pain from epidural as well as left lower leg pain. She states the leg pain has been occuring all morning and she feels like her legs are swollen. Leg pain not relieved with meds.   Objective: Blood pressure 118/84, pulse 57, temperature 98 F (36.7 C), temperature source Oral, resp. rate 18, height 5\' 2"  (1.575 m), weight 92.534 kg (204 lb), last menstrual period 09/01/2014, SpO2 99 %, unknown if currently breastfeeding.  Physical Exam:  General: alert, cooperative and no distress Lochia: appropriate Uterine Fundus: firm Incision: n/a DVT Evaluation: Negative Homan's sign. No cords or calf tenderness. 2+ ankle edema bilaterally. Left calf tenderness. Left calf appearing larger than right by using hand circumference.    Recent Labs  05/20/15 0057 05/20/15 1025  HGB 9.4* 9.5*  HCT 28.0* 27.6*    Assessment/Plan: Concerned for possible DVT of left lower extremity due to pain and swelling. Will get Venous doppler U/S of LLE. Discharge home if U/S normal.    LOS: 3 days   Natasha Bean 05/21/2015, 10:27 AM

## 2015-05-23 ENCOUNTER — Telehealth: Payer: Self-pay | Admitting: *Deleted

## 2015-05-23 NOTE — Telephone Encounter (Signed)
Spoke with pt. Pt stated she has occ headache and chest pains. I spoke with Selena BattenKim and she advised she didn't [facility] on her symptoms. Pt mentioned having back pain and wonders if that could be from the epidural. I advised it's possible but to mention that at office visit tomorrow. Pt was advised if she started feeling worse throughout the evening and needed to be seen before tomorrow, go to [facility] for eval. Pt has appt here at 9:30 tomorrow am. She was advised to keep that appt. Pt voiced understanding. JSY

## 2015-05-23 NOTE — Telephone Encounter (Signed)
[facility] Bean with [facility] called about a low hemoglobin. Pt had a vaginal delivery on 05/19/15. Hemoglobin today was 8.6. Pt is taking 3 iron pills daily and prenatal daily when she remembers. Has appt tomorrow. I have left 2 messages for pt to return call. JSY

## 2015-05-24 ENCOUNTER — Inpatient Hospital Stay (HOSPITAL_COMMUNITY)
Admission: AD | Admit: 2015-05-24 | Discharge: 2015-05-26 | DRG: 776 | Disposition: A | Discharge status: Home / Self Care | Payer: Medicaid Other | Source: Ambulatory Visit | Attending: Obstetrics & Gynecology | Admitting: Obstetrics & Gynecology

## 2015-05-24 ENCOUNTER — Encounter (HOSPITAL_COMMUNITY): Payer: Self-pay | Admitting: *Deleted

## 2015-05-24 ENCOUNTER — Encounter: Payer: Self-pay | Admitting: Women's Health

## 2015-05-24 ENCOUNTER — Ambulatory Visit (INDEPENDENT_AMBULATORY_CARE_PROVIDER_SITE_OTHER): Payer: Medicaid Other | Admitting: Women's Health

## 2015-05-24 VITALS — BP 190/92 | HR 60 | Wt 204.0 lb

## 2015-05-24 DIAGNOSIS — R51 Headache: Secondary | ICD-10-CM

## 2015-05-24 DIAGNOSIS — O9081 Anemia of the puerperium: Secondary | ICD-10-CM | POA: Diagnosis present

## 2015-05-24 DIAGNOSIS — Z888 Allergy status to other drugs, medicaments and biological substances status: Secondary | ICD-10-CM

## 2015-05-24 DIAGNOSIS — O09899 Supervision of other high risk pregnancies, unspecified trimester: Secondary | ICD-10-CM

## 2015-05-24 DIAGNOSIS — R519 Headache, unspecified: Secondary | ICD-10-CM

## 2015-05-24 DIAGNOSIS — O99335 Smoking (tobacco) complicating the puerperium: Secondary | ICD-10-CM | POA: Diagnosis present

## 2015-05-24 DIAGNOSIS — O165 Unspecified maternal hypertension, complicating the puerperium: Secondary | ICD-10-CM

## 2015-05-24 DIAGNOSIS — O99325 Drug use complicating the puerperium: Secondary | ICD-10-CM | POA: Diagnosis present

## 2015-05-24 DIAGNOSIS — F1290 Cannabis use, unspecified, uncomplicated: Secondary | ICD-10-CM | POA: Diagnosis present

## 2015-05-24 DIAGNOSIS — Z833 Family history of diabetes mellitus: Secondary | ICD-10-CM

## 2015-05-24 DIAGNOSIS — R1013 Epigastric pain: Secondary | ICD-10-CM | POA: Diagnosis not present

## 2015-05-24 DIAGNOSIS — D649 Anemia, unspecified: Secondary | ICD-10-CM | POA: Diagnosis present

## 2015-05-24 DIAGNOSIS — F17210 Nicotine dependence, cigarettes, uncomplicated: Secondary | ICD-10-CM | POA: Diagnosis present

## 2015-05-24 DIAGNOSIS — R079 Chest pain, unspecified: Secondary | ICD-10-CM | POA: Diagnosis not present

## 2015-05-24 DIAGNOSIS — O1415 Severe pre-eclampsia, complicating the puerperium: Secondary | ICD-10-CM | POA: Diagnosis present

## 2015-05-24 DIAGNOSIS — O99019 Anemia complicating pregnancy, unspecified trimester: Secondary | ICD-10-CM | POA: Diagnosis present

## 2015-05-24 DIAGNOSIS — Z8249 Family history of ischemic heart disease and other diseases of the circulatory system: Secondary | ICD-10-CM | POA: Diagnosis not present

## 2015-05-24 LAB — URINALYSIS, ROUTINE W REFLEX MICROSCOPIC
BILIRUBIN URINE: NEGATIVE
Glucose, UA: NEGATIVE mg/dL
Ketones, ur: NEGATIVE mg/dL
NITRITE: NEGATIVE
Protein, ur: NEGATIVE mg/dL
SPECIFIC GRAVITY, URINE: 1.01 (ref 1.005–1.030)
pH: 5.5 (ref 5.0–8.0)

## 2015-05-24 LAB — CBC
HEMATOCRIT: 26.8 % — AB (ref 36.0–46.0)
HEMOGLOBIN: 9 g/dL — AB (ref 12.0–15.0)
MCH: 29.8 pg (ref 26.0–34.0)
MCHC: 33.6 g/dL (ref 30.0–36.0)
MCV: 88.7 fL (ref 78.0–100.0)
Platelets: 179 10*3/uL (ref 150–400)
RBC: 3.02 MIL/uL — ABNORMAL LOW (ref 3.87–5.11)
RDW: 13.8 % (ref 11.5–15.5)
WBC: 8.4 10*3/uL (ref 4.0–10.5)

## 2015-05-24 LAB — URINE MICROSCOPIC-ADD ON

## 2015-05-24 LAB — TROPONIN I

## 2015-05-24 LAB — PROTEIN / CREATININE RATIO, URINE
CREATININE, URINE: 44 mg/dL
PROTEIN CREATININE RATIO: 0.91 mg/mg{creat} — AB (ref 0.00–0.15)
TOTAL PROTEIN, URINE: 40 mg/dL

## 2015-05-24 LAB — COMPREHENSIVE METABOLIC PANEL
ALBUMIN: 2.8 g/dL — AB (ref 3.5–5.0)
ALK PHOS: 151 U/L — AB (ref 38–126)
ALT: 107 U/L — ABNORMAL HIGH (ref 14–54)
ANION GAP: 10 (ref 5–15)
AST: 140 U/L — AB (ref 15–41)
BUN: 13 mg/dL (ref 6–20)
CALCIUM: 8.3 mg/dL — AB (ref 8.9–10.3)
CO2: 22 mmol/L (ref 22–32)
Chloride: 109 mmol/L (ref 101–111)
Creatinine, Ser: 0.75 mg/dL (ref 0.44–1.00)
GFR calc Af Amer: >60 mL/min (ref >60)
GFR calc non Af Amer: >60 mL/min (ref >60)
GLUCOSE: 83 mg/dL (ref 65–99)
Potassium: 3.5 mmol/L (ref 3.5–5.1)
SODIUM: 141 mmol/L (ref 135–145)
Total Bilirubin: 0.9 mg/dL (ref 0.3–1.2)
Total Protein: 5.9 g/dL — ABNORMAL LOW (ref 6.5–8.1)

## 2015-05-24 LAB — BRAIN NATRIURETIC PEPTIDE: B Natriuretic Peptide: 589.1 pg/mL — ABNORMAL HIGH (ref 0.0–100.0)

## 2015-05-24 MED ORDER — ZOLPIDEM TARTRATE 5 MG PO TABS
5.0000 mg | ORAL_TABLET | Freq: Every evening | ORAL | Status: DC | PRN
  Administered 2015-05-24: 5 mg via ORAL
  Filled 2015-05-24: qty 1

## 2015-05-24 MED ORDER — HYDRALAZINE HCL 20 MG/ML IJ SOLN
10.0000 mg | Freq: Once | INTRAMUSCULAR | Status: AC | PRN
  Administered 2015-05-24: 10 mg via INTRAVENOUS
  Filled 2015-05-24: qty 1

## 2015-05-24 MED ORDER — AMLODIPINE BESYLATE 5 MG PO TABS: 5.0000 mg | ORAL_TABLET | Freq: Every day | ORAL | Status: DC

## 2015-05-24 MED ORDER — FERROUS SULFATE 325 (65 FE) MG PO TABS
325.0000 mg | ORAL_TABLET | Freq: Two times a day (BID) | ORAL | Status: DC
  Administered 2015-05-24 – 2015-05-26 (×4): 325 mg via ORAL
  Filled 2015-05-24 (×6): qty 1

## 2015-05-24 MED ORDER — CALCIUM CARBONATE ANTACID 500 MG PO CHEW
2.0000 | CHEWABLE_TABLET | ORAL | Status: DC | PRN
  Administered 2015-05-25: 400 mg via ORAL
  Filled 2015-05-24: qty 2

## 2015-05-24 MED ORDER — MAGNESIUM SULFATE BOLUS VIA INFUSION
6.0000 g | Freq: Once | INTRAVENOUS | Status: AC
  Administered 2015-05-24: 6 g via INTRAVENOUS
  Filled 2015-05-24: qty 500

## 2015-05-24 MED ORDER — HYDROCHLOROTHIAZIDE 25 MG PO TABS: 25.0000 mg | ORAL_TABLET | Freq: Every day | ORAL | Status: DC

## 2015-05-24 MED ORDER — IBUPROFEN 800 MG PO TABS
400.0000 mg | ORAL_TABLET | Freq: Four times a day (QID) | ORAL | Status: DC | PRN
  Administered 2015-05-24: 400 mg via ORAL
  Filled 2015-05-24: qty 1

## 2015-05-24 MED ORDER — DOCUSATE SODIUM 100 MG PO CAPS
100.0000 mg | ORAL_CAPSULE | Freq: Every day | ORAL | Status: DC
Start: 2015-05-24 — End: 2015-05-26
  Administered 2015-05-25 – 2015-05-26 (×2): 100 mg via ORAL
  Filled 2015-05-24 (×2): qty 1

## 2015-05-24 MED ORDER — HYDROCHLOROTHIAZIDE 25 MG PO TABS
25.0000 mg | ORAL_TABLET | Freq: Every day | ORAL | Status: DC
  Administered 2015-05-24 – 2015-05-26 (×3): 25 mg via ORAL
  Filled 2015-05-24 (×3): qty 1

## 2015-05-24 MED ORDER — PRENATAL MULTIVITAMIN CH
1.0000 | ORAL_TABLET | Freq: Every day | ORAL | Status: DC
  Administered 2015-05-24 – 2015-05-25 (×2): 1 via ORAL
  Filled 2015-05-24 (×2): qty 1

## 2015-05-24 MED ORDER — MAGNESIUM SULFATE 50 % IJ SOLN
2.0000 g/h | INTRAVENOUS | Status: AC
  Administered 2015-05-24 – 2015-05-25 (×2): 2 g/h via INTRAVENOUS
  Filled 2015-05-24 (×2): qty 80

## 2015-05-24 MED ORDER — LABETALOL HCL 5 MG/ML IV SOLN
20.0000 mg | INTRAVENOUS | Status: AC | PRN
  Administered 2015-05-24: 80 mg via INTRAVENOUS
  Administered 2015-05-24: 20 mg via INTRAVENOUS
  Administered 2015-05-24: 40 mg via INTRAVENOUS
  Filled 2015-05-24: qty 16
  Filled 2015-05-24: qty 4
  Filled 2015-05-24: qty 8

## 2015-05-24 MED ORDER — OXYCODONE-ACETAMINOPHEN 5-325 MG PO TABS
1.0000 | ORAL_TABLET | Freq: Four times a day (QID) | ORAL | Status: DC | PRN
  Administered 2015-05-24 – 2015-05-25 (×5): 1 via ORAL
  Filled 2015-05-24 (×6): qty 1

## 2015-05-24 MED ORDER — LACTATED RINGERS IV SOLN
INTRAVENOUS | Status: AC
  Administered 2015-05-24 – 2015-05-25 (×3): via INTRAVENOUS

## 2015-05-24 NOTE — H&P (Signed)
FACULTY PRACTICE ADMISSION HISTORY AND PHYSICAL NOTE  History of Present Illness: Natasha Bean is a 30 y.o. G1P1001 currently 5 days postpartum after NSVD. The patient was seen at [facility] for BP check. Her BP there was 190/94 and she was directed to the MAU. She was diagnosed with preeclampsia without severe features and was induced. She delivered on 05/19/2015.  She reports a severe HA that has not improved with tylenol for the past 1-2 days. Denies blurry vision. Reports right flank pain. Reports increased swelling in her bilateral legs since delivery. Reports intermittent SOB and mild chest tightness and pain that is intermittent and non-exertional.   Patient Active Problem List   Diagnosis Date Noted  . Preeclampsia in postpartum period 05/24/2015  . Mild preeclampsia 05/18/2015  . Marijuana use 02/14/2015  . Anemia affecting pregnancy, antepartum 02/14/2015  . Hematuria 01/06/2015  . Supervision of other high-risk pregnancy 11/01/2014  . Trichomonal vaginitis during pregnancy in first trimester 10/28/2014    Past Medical History  Diagnosis Date  . Asthma   . Anemia   . Migraine   . Vaginal discharge during pregnancy in second trimester 01/06/2015  . Trichimoniasis 01/06/2015    Repeat neg- 01/17/15  . Round ligament pain 01/06/2015  . Hematuria 01/06/2015  . Urinary frequency 01/06/2015  . Pregnancy induced hypertension   . History of marijuana use 11/01/2014    Repeat UDS- Neg (04/19/2015)    Past Surgical History  Procedure Laterality Date  . No past surgeries      OB History  Gravida Para Term Preterm AB SAB TAB Ectopic Multiple Living  0 1    # Outcome Date GA Lbr Len/2nd Weight Sex Delivery Anes PTL Lv  1 Term 05/19/15 [redacted]w[redacted]d 14:39 / 00:29 5 lb 11.7 oz (2.6 kg) F Vag-Spont EPI  Y      Social History   Social History  . Marital Status: Single    Spouse Name: N/A  . Number of Children: N/A  . Years of Education: N/A   Social History Main Topics  .  Smoking status: Current Some Day Smoker -- 0.25 packs/day for 10 years    Types: Cigarettes  . Smokeless tobacco: Never Used  . Alcohol Use: No  . Drug Use: No  . Sexual Activity: Not Currently    Birth Control/ Protection: None   Other Topics Concern  . None   Social History Narrative    Family History  Problem Relation Age of Onset  . Cancer Mother     throat  . Hypertension Father   . Diabetes Father   . Cancer Father     colon  . Heart disease Father   . Thyroid disease Father   . Heart disease Paternal Grandmother   . Hypertension Paternal Grandmother   . Diabetes Paternal Grandmother     Allergies  Allergen Reactions  . Benadryl [Diphenhydramine Hcl] Other (See Comments)    Makes her shakey  . Fioricet [Butalbital-Apap-Caffeine] Itching and Nausea And Vomiting    Able to take tylenol     Prescriptions prior to admission  Medication Sig Dispense Refill Last Dose  . ferrous sulfate 325 (65 FE) MG tablet Take 1 tablet (325 mg total) by mouth 2 (two) times daily with a meal. (Patient taking differently: Take 325 mg by mouth 3 (three) times daily with meals. ) 60 tablet 3 05/23/2015 at Unknown time  . ibuprofen (ADVIL,MOTRIN) 600 MG tablet Take 1 tablet (  600 mg total) by mouth every 6 (six) hours. 30 tablet 0 05/23/2015 at Unknown time  . pantoprazole (PROTONIX) 20 MG tablet Take 1 tablet (20 mg total) by mouth daily. (Patient taking differently: Take 20 mg by mouth daily as needed for heartburn. ) 30 tablet 3 05/23/2015 at Unknown time  . Prenatal Vit-Fe Fumarate-FA (MULTIVITAMIN-PRENATAL) 27-0.8 MG TABS tablet Take 1 tablet by mouth daily at 12 noon.   05/23/2015 at Unknown time    Review of Systems - Negative except those in HPI  Vitals:  BP 202/95 mmHg  Pulse 57  Temp(Src) 98.1 F (36.7 C) (Oral)  Resp 18  SpO2 100%  LMP 09/01/2014 Physical Examination: CONSTITUTIONAL: Well-developed, well-nourished female in no acute distress. Ill but non toxic appearing   HENT:  Normocephalic, atraumatic. Oropharynx is clear and moist EYES: Conjunctivae and EOM are normal.  No scleral icterus.  NECK: Normal range of motion, supple, no masses. No JVD. SKIN: Skin is warm and dry. No rash noted. Not diaphoretic. No erythema. No pallor. NEUROLGIC: Alert and oriented to person, place, and time. Normal reflexes, muscle tone coordination. No cranial nerve deficit noted.  PSYCHIATRIC: Normal mood and affect. Normal behavior. Normal judgment and thought content. CARDIOVASCULAR: Normal heart rate noted, regular rhythm. RESPIRATORY: Effort and breath sounds normal, no problems with respiration noted. CTAB with no crackles at bases.  ABDOMEN: Soft, nondistended. TTP in RUQ over liver edge.  MUSCULOSKELETAL: Normal range of motion. No edema and no tenderness. 2+ distal pulses. Ext: 2+ pitting edema bilaterally to knees.   EKG:  NSR, mild bradycardia. No ST elevations or depression. No S1Q1T3 pattern.   Labs:  Results for orders placed or performed during the hospital encounter of 05/24/15 (from the past 24 hour(s))  Protein / creatinine ratio, urine   Collection Time: 05/24/15  1:08 PM  Result Value Ref Range   Creatinine, Urine 44.00 mg/dL   Total Protein, Urine 40 mg/dL   Protein Creatinine Ratio 0.91 (H) 0.00 - 0.15 mg/mg[Cre]  Urinalysis, Routine w reflex microscopic (not at [facility])   Collection Time: 05/24/15  1:16 PM  Result Value Ref Range   Color, Urine YELLOW YELLOW   APPearance CLEAR CLEAR   Specific Gravity, Urine 1.010 1.005 - 1.030   pH 5.5 5.0 - 8.0   Glucose, UA NEGATIVE NEGATIVE mg/dL   Hgb urine dipstick LARGE (A) NEGATIVE   Bilirubin Urine NEGATIVE NEGATIVE   Ketones, ur NEGATIVE NEGATIVE mg/dL   Protein, ur NEGATIVE NEGATIVE mg/dL   Nitrite NEGATIVE NEGATIVE   Leukocytes, UA SMALL (A) NEGATIVE  Urine microscopic-add on   Collection Time: 05/24/15  1:16 PM  Result Value Ref Range   Squamous Epithelial / LPF 0-5 (A) NONE SEEN   WBC, UA 0-5  0 - 5 WBC/hpf   RBC / HPF 6-30 0 - 5 RBC/hpf   Bacteria, UA RARE (A) NONE SEEN  Comprehensive metabolic panel   Collection Time: 05/24/15  1:45 PM  Result Value Ref Range   Sodium 141 135 - 145 mmol/L   Potassium 3.5 3.5 - 5.1 mmol/L   Chloride 109 101 - 111 mmol/L   CO2 22 22 - 32 mmol/L   Glucose, Bld 83 65 - 99 mg/dL   BUN 13 6 - 20 mg/dL   Creatinine, Ser 9.560.75 0.44 - 1.00 mg/dL   Calcium 8.3 (L) 8.9 - 10.3 mg/dL   Total Protein 5.9 (L) 6.5 - 8.1 g/dL   Albumin 2.8 (L) 3.5 - 5.0 g/dL   AST  140 (H) 15 - 41 U/L   ALT 107 (H) 14 - 54 U/L   Alkaline Phosphatase 151 (H) 38 - 126 U/L   Total Bilirubin 0.9 0.3 - 1.2 mg/dL   GFR calc non Af Amer >60 >60 mL/min   GFR calc Af Amer >60 >60 mL/min   Anion gap 10 5 - 15  CBC   Collection Time: 05/24/15  1:45 PM  Result Value Ref Range   WBC 8.4 4.0 - 10.5 K/uL   RBC 3.02 (L) 3.87 - 5.11 MIL/uL   Hemoglobin 9.0 (L) 12.0 - 15.0 g/dL   HCT 16.1 (L) 09.6 - 04.5 %   MCV 88.7 78.0 - 100.0 fL   MCH 29.8 26.0 - 34.0 pg   MCHC 33.6 30.0 - 36.0 g/dL   RDW 40.9 81.1 - 91.4 %   Platelets 179 150 - 400 K/uL    Imaging Studies:  Assessment and Plan: Patient Active Problem List   Diagnosis Date Noted  . Preeclampsia in postpartum period 05/24/2015  . Mild preeclampsia 05/18/2015  . Marijuana use 02/14/2015  . Anemia affecting pregnancy, antepartum 02/14/2015  . Hematuria 01/06/2015  . Supervision of other high-risk pregnancy 11/01/2014  . Trichomonal vaginitis during pregnancy in first trimester 10/28/2014   Admit to [facility] for magnesium therapy given Preeclampsia with severe features (severe range BP, HA, RUQ pain)  #Postpartum HELLP with (HA, severe range BP 200s/100s, AST/ALT 2x ULN, UPC 0.91).  - IV in place - Labetalol protocol - Magnesium infusion (6g bolus, 2g infusion) x 24 hours - CMP, CBC, UPC - Start amlodipine  now, can start ACE-I if needed and creatinine is appropriate  #SOB/Chest Pain - concern in  office for pulmonary edema vs cardiomyopathy. Exam reassuring currently and EKG reassuring - BNP and troponin to help r/o cardiac etiology.  - consider PE work up if this worsens  #Postpartum care - pump to bedside for frequent pumping  #Diet regular  #PPX - SCDs in place  Federico Flake, MD Fellow Obstetrician & Gynecologist Faculty Practice, [facility]

## 2015-05-24 NOTE — Progress Notes (Signed)
To 303 by Quintella BatonJo Salaam Battershell RNC

## 2015-05-24 NOTE — MAU Note (Signed)
Pt had vaginal delivery last Thursday, BP was elevated before delivery.  Pt sent from MD office with elevated BP, has been having HA's & chest pain, feels SOB @ times since Sunday.  Pt states her bleeding has slowed down, seems normal.

## 2015-05-24 NOTE — Progress Notes (Addendum)
Patient ID: Natasha Bean Vandam, female   DOB: 04/06/1985, 30 y.o.   MRN: [id number]   [facility]  Patient name: Natasha Bean Bruneau MRN [id number]  Date of birth: 10/17/1985  CC & HPI:  Edilia BoChristy Bean Som is Bean 30 y.o. 801P1001 African American female 5d s/p SVB after IOL @ 37wks d/t pre-e w/o severe features, presenting today for bp check. She did not require intrapartum/postpartum magnesium or bp meds on d/c. [facility] reporting Hgb 8.6 (was 9.9 on admission and 9.5 four days ago)- reports she is taking pnv and Fe TID as directed. She denies dizziness/lightheadedness, but does report chest pain and intermittent feelings of sob/feeling like she needs to gasp for air- even when sitting down. +HA, apap doesn't really help. No visual changes. Some epigastric/chest pain. Denies n/v. Feels like swelling in legs has gotten much worse since leaving hospital. Also reports lower back pain that shoots down both legs bilaterally- apap and ibuprofen not helping. Had epidural. Is bottlefeeding. Reports lochia as normal, not heavy. States she just doesn't feel well.  Patient's last menstrual period was 09/01/2014.  Pertinent History Reviewed:  Medical & Surgical Hx:   Past medical, surgical, family, and social history reviewed in electronic medical record Medications: Reviewed & Updated - see associated section Allergies: Reviewed in electronic medical record  Objective Findings:  Vitals: BP 190/92 mmHg  Pulse 60  Wt 204 lb (92.534 kg)  LMP 09/01/2014  Breastfeeding? No Body mass index is 37.3 kg/(m^2).  BP retake: 164/88  Physical Examination: General appearance - alert, oriented, no acute distress Chest - clear to auscultation, no wheezes, rales or rhonchi, symmetric air entry Heart - normal rate and regular rhythm Back- no tenderness to palpation Abdomen - + tenderness to RUQ and epigastric region Fundus firm, involuting well Extremities - 2-3+ BLE edema up  to knees, DTRs 2+, no clonus  No results found for this or any previous visit (from the past 24 hour(s)).   Assessment & Plan:  Bean:   5d s/p SVB after IOL @ 37wks for pre-e w/o severe features  PP Hypertension, likely continued pre-e  RUQ/epigastric pain  Intermittent sob  Headache  Back pain  Anemia  Bottlefeeding  P:  Discussed w/ [facility], will send to [facility] to r/o pulmonary edema/cardiomyopathy, assess for need for readmission   Called VSmith,CNM to notify to expect pt  Pt directed to go straight to [facility]au, fob present and will drive her  F/U 1 week  Marge DuncansBooker, Canio Winokur Randall CNM, [facility]-BC 05/24/2015 10:40 AM

## 2015-05-24 NOTE — MAU Note (Signed)
DTRs 1+ and no clonus.

## 2015-05-25 ENCOUNTER — Encounter (HOSPITAL_COMMUNITY): Payer: Self-pay | Admitting: *Deleted

## 2015-05-25 ENCOUNTER — Inpatient Hospital Stay (HOSPITAL_COMMUNITY): Payer: Medicaid Other

## 2015-05-25 DIAGNOSIS — R079 Chest pain, unspecified: Secondary | ICD-10-CM

## 2015-05-25 LAB — COMPREHENSIVE METABOLIC PANEL
ALT: 116 U/L — AB (ref 14–54)
AST: 105 U/L — AB (ref 15–41)
Albumin: 2.3 g/dL — ABNORMAL LOW (ref 3.5–5.0)
Alkaline Phosphatase: 114 U/L (ref 38–126)
Anion gap: 7 (ref 5–15)
BUN: 10 mg/dL (ref 6–20)
CHLORIDE: 106 mmol/L (ref 101–111)
CO2: 26 mmol/L (ref 22–32)
CREATININE: 0.87 mg/dL (ref 0.44–1.00)
Calcium: 7.3 mg/dL — ABNORMAL LOW (ref 8.9–10.3)
GFR calc Af Amer: >60 mL/min (ref >60)
GLUCOSE: 104 mg/dL — AB (ref 65–99)
Potassium: 3.1 mmol/L — ABNORMAL LOW (ref 3.5–5.1)
Sodium: 139 mmol/L (ref 135–145)
Total Bilirubin: 0.8 mg/dL (ref 0.3–1.2)
Total Protein: 5.1 g/dL — ABNORMAL LOW (ref 6.5–8.1)

## 2015-05-25 LAB — CBC
HCT: 24.4 % — ABNORMAL LOW (ref 36.0–46.0)
Hemoglobin: 8.2 g/dL — ABNORMAL LOW (ref 12.0–15.0)
MCH: 29.7 pg (ref 26.0–34.0)
MCHC: 33.6 g/dL (ref 30.0–36.0)
MCV: 88.4 fL (ref 78.0–100.0)
PLATELETS: 148 10*3/uL — AB (ref 150–400)
RBC: 2.76 MIL/uL — AB (ref 3.87–5.11)
RDW: 13.8 % (ref 11.5–15.5)
WBC: 7 10*3/uL (ref 4.0–10.5)

## 2015-05-25 LAB — ECHOCARDIOGRAM COMPLETE: WEIGHTICAEL: 3184 [oz_av]

## 2015-05-25 MED ORDER — BENAZEPRIL HCL 10 MG PO TABS
10.0000 mg | ORAL_TABLET | Freq: Every day | ORAL | Status: DC
  Administered 2015-05-25 – 2015-05-26 (×2): 10 mg via ORAL
  Filled 2015-05-25 (×4): qty 1

## 2015-05-25 MED ORDER — SODIUM CHLORIDE 0.9% FLUSH
3.0000 mL | Freq: Two times a day (BID) | INTRAVENOUS | Status: DC
  Administered 2015-05-25: 3 mL via INTRAVENOUS

## 2015-05-25 MED ORDER — HYDRALAZINE HCL 20 MG/ML IJ SOLN
10.0000 mg | INTRAMUSCULAR | Status: DC | PRN
  Administered 2015-05-25 (×2): 10 mg via INTRAVENOUS
  Filled 2015-05-25: qty 1

## 2015-05-25 MED ORDER — SODIUM CHLORIDE 0.9% FLUSH: 3.0000 mL | INTRAVENOUS | Status: DC | PRN

## 2015-05-25 MED ORDER — HYDRALAZINE HCL 20 MG/ML IJ SOLN
5.0000 mg | Freq: Once | INTRAMUSCULAR | Status: DC
  Filled 2015-05-25: qty 1

## 2015-05-25 MED ORDER — FUROSEMIDE 10 MG/ML IJ SOLN
20.0000 mg | Freq: Once | INTRAMUSCULAR | Status: AC
  Administered 2015-05-25: 20 mg via INTRAVENOUS
  Filled 2015-05-25: qty 2

## 2015-05-25 MED ORDER — POTASSIUM CHLORIDE CRYS ER 20 MEQ PO TBCR
40.0000 meq | EXTENDED_RELEASE_TABLET | Freq: Two times a day (BID) | ORAL | Status: DC
  Administered 2015-05-25 – 2015-05-26 (×3): 40 meq via ORAL
  Filled 2015-05-25 (×5): qty 2

## 2015-05-25 NOTE — Anesthesia Postprocedure Evaluation (Signed)
Anesthesia Post Note  Patient: Natasha Bean  Procedure(s) Performed: * No procedures listed *  Patient location during evaluation: Mother Baby Anesthesia Type: Epidural Level of consciousness: awake and alert Pain management: pain level controlled Vital Signs Assessment: post-procedure vital signs reviewed and stable Respiratory status: spontaneous breathing, nonlabored ventilation and respiratory function stable Cardiovascular status: stable Postop Assessment: no headache and no backache Anesthetic complications: no    Last Vitals:  Filed Vitals:   05/25/15 0600 05/25/15 0745  BP:  152/83  Pulse:  71  Temp:  36.6 C  Resp: 20 20    Last Pain:  Filed Vitals:   05/25/15 0834  PainSc: 3                  Quamere Mussell EDWARD

## 2015-05-25 NOTE — Progress Notes (Signed)
Curbsided  Dr. Thurnell Garbeiccotto, cardiologist on call regarding elevated BNP of 589.1 pg/ml.  Patient is doing well, no chest pain currently, and having good diuresis (600 ml in 2 hours) without any diuretic agents.  She is on magnesium sulfate for eclampsia prophylaxis.  Dr. Thurnell Garbeiccotto recommended obtaining an ECHO this morning, this study was ordered.  Will follow up report and continue to observe patient closely.      I appreciate Dr. Lenice Llamasiccotto's input about this matter.    Natasha Bean  Natasha Vanroekel, MD, FACOG Attending Obstetrician & Gynecologist, [facility] Faculty Practice, [facility]

## 2015-05-25 NOTE — Progress Notes (Signed)
Pt & family have questions & would like to speak to an MD about 2D-Echo results & the Lotensin that has been ordered - Dr Tawanna SatWoulk notified.

## 2015-05-25 NOTE — Progress Notes (Signed)
  Echocardiogram 2D Echocardiogram has been performed.  Arvil ChacoFoster, Gadge Hermiz 05/25/2015, 8:48 AM

## 2015-05-25 NOTE — Progress Notes (Signed)
Faculty Practice OB/GYN Attending Note  Subjective:  Patient doing well, no chest pain, SOB. Mild 3/10 headache, no visual symptoms, no RUQ/epigastric pain.  Admitted on 05/24/2015 for Hypertension in pregnancy, preeclampsia, severe, postpartum condition.   Objective:  Blood pressure 151/75, pulse 88, temperature 98.1 F (36.7 C), temperature source Oral, resp. rate 20, weight 199 lb (90.266 kg), SpO2 100 %, not currently breastfeeding. Temp:  [97.9 F (36.6 C)-98.3 F (36.8 C)] 98.1 F (36.7 C) (03/15 0401) Pulse Rate:  [53-88] 88 (03/15 0401) Resp:  [13-20] 20 (03/15 0600) BP: (132-213)/(69-107) 151/75 mmHg (03/15 0401) SpO2:  [97 %-100 %] 100 % (03/15 0600) Weight:  [199 lb (90.266 kg)-204 lb (92.534 kg)] 199 lb (90.266 kg) (03/15 0600)  03/14 0701 - 03/15 0700 In: 3172.5 [P.O.:1720; I.V.:1452.5] Out: 5300 [Urine:5300]   Weight change:  -5 lbs since weight of 204 lbs on 05/21/15  Gen: NAD HENT: Normocephalic, atraumatic, + periorbital edema Lungs: Normal respiratory effort, CTAB Heart: Regular rate noted Abdomen: NT, soft Cervix: Deferred Ext: 2+ DTRs,1+ BLE edema, no cyanosis, negative Homan's sign  Results for orders placed or performed during the hospital encounter of 05/24/15 (from the past 24 hour(s))  Protein / creatinine ratio, urine     Status: Abnormal   Collection Time: 05/24/15  1:08 PM  Result Value Ref Range   Creatinine, Urine 44.00 mg/dL   Total Protein, Urine 40 mg/dL   Protein Creatinine Ratio 0.91 (H) 0.00 - 0.15 mg/mg[Cre]  Urinalysis, Routine w reflex microscopic (not [facility])     Status: Abnormal   Collection Time: 05/24/15  1:16 PM  Result Value Ref Range   Color, Urine YELLOW YELLOW   APPearance CLEAR CLEAR   Specific Gravity, Urine 1.010 1.005 - 1.030   pH 5.5 5.0 - 8.0   Glucose, UA NEGATIVE NEGATIVE mg/dL   Hgb urine dipstick LARGE (A) NEGATIVE   Bilirubin Urine NEGATIVE NEGATIVE   Ketones, ur NEGATIVE NEGATIVE mg/dL   Protein, ur  NEGATIVE NEGATIVE mg/dL   Nitrite NEGATIVE NEGATIVE   Leukocytes, UA SMALL (A) NEGATIVE  Urine microscopic-add on     Status: Abnormal   Collection Time: 05/24/15  1:16 PM  Result Value Ref Range   Squamous Epithelial / LPF 0-5 (A) NONE SEEN   WBC, UA 0-5 0 - 5 WBC/hpf   RBC / HPF 6-30 0 - 5 RBC/hpf   Bacteria, UA RARE (A) NONE SEEN  Comprehensive metabolic panel     Status: Abnormal   Collection Time: 05/24/15  1:45 PM  Result Value Ref Range   Sodium 141 135 - 145 mmol/L   Potassium 3.5 3.5 - 5.1 mmol/L   Chloride 109 101 - 111 mmol/L   CO2 22 22 - 32 mmol/L   Glucose, Bld 83 65 - 99 mg/dL   BUN 13 6 - 20 mg/dL   Creatinine, Ser 9.600.75 0.44 - 1.00 mg/dL   Calcium 8.3 (L) 8.9 - 10.3 mg/dL   Total Protein 5.9 (L) 6.5 - 8.1 g/dL   Albumin 2.8 (L) 3.5 - 5.0 g/dL   AST 454140 (H) 15 - 41 U/L   ALT 107 (H) 14 - 54 U/L   Alkaline Phosphatase 151 (H) 38 - 126 U/L   Total Bilirubin 0.9 0.3 - 1.2 mg/dL   GFR calc non Af Amer >60 >60 mL/min   GFR calc Af Amer >60 >60 mL/min   Anion gap 10 5 - 15  CBC     Status: Abnormal   Collection Time:  05/24/15  1:45 PM  Result Value Ref Range   WBC 8.4 4.0 - 10.5 K/uL   RBC 3.02 (L) 3.87 - 5.11 MIL/uL   Hemoglobin 9.0 (L) 12.0 - 15.0 g/dL   HCT 13.0 (L) 86.5 - 78.4 %   MCV 88.7 78.0 - 100.0 fL   MCH 29.8 26.0 - 34.0 pg   MCHC 33.6 30.0 - 36.0 g/dL   RDW 69.6 29.5 - 28.4 %   Platelets 179 150 - 400 K/uL  Brain natriuretic peptide     Status: Abnormal   Collection Time: 05/24/15  1:45 PM  Result Value Ref Range   B Natriuretic Peptide 589.1 (H) 0.0 - 100.0 pg/mL  Troponin I     Status: None   Collection Time: 05/24/15  1:45 PM  Result Value Ref Range   Troponin I <0.03 <0.031 ng/mL  CBC     Status: Abnormal   Collection Time: 05/25/15  5:50 AM  Result Value Ref Range   WBC 7.0 4.0 - 10.5 K/uL   RBC 2.76 (L) 3.87 - 5.11 MIL/uL   Hemoglobin 8.2 (L) 12.0 - 15.0 g/dL   HCT 13.2 (L) 44.0 - 10.2 %   MCV 88.4 78.0 - 100.0 fL   MCH 29.7 26.0 -  34.0 pg   MCHC 33.6 30.0 - 36.0 g/dL   RDW 72.5 36.6 - 44.0 %   Platelets 148 (L) 150 - 400 K/uL  Comprehensive metabolic panel     Status: Abnormal   Collection Time: 05/25/15  5:50 AM  Result Value Ref Range   Sodium 139 135 - 145 mmol/L   Potassium 3.1 (L) 3.5 - 5.1 mmol/L   Chloride 106 101 - 111 mmol/L   CO2 26 22 - 32 mmol/L   Glucose, Bld 104 (H) 65 - 99 mg/dL   BUN 10 6 - 20 mg/dL   Creatinine, Ser 3.47 0.44 - 1.00 mg/dL   Calcium 7.3 (L) 8.9 - 10.3 mg/dL   Total Protein 5.1 (L) 6.5 - 8.1 g/dL   Albumin 2.3 (L) 3.5 - 5.0 g/dL   AST 425 (H) 15 - 41 U/L   ALT 116 (H) 14 - 54 U/L   Alkaline Phosphatase 114 38 - 126 U/L   Total Bilirubin 0.8 0.3 - 1.2 mg/dL   GFR calc non Af Amer >60 >60 mL/min   GFR calc Af Amer >60 >60 mL/min   Anion gap 7 5 - 15    Assessment & Plan:  30 y.o. G1P1001 admitted for severe postpartum preeclampsia - Continue BP medications, magnesium sulfate for eclampsia prophylaxis until around 1600. May need to change regimen to optimize BP control after magnesium sulfate is off   - Will follow up ECHO read, may need cardiology input for any concerning findings - Patient is diuresing well, no need for extra diuretics (Lasix) at this point, she is already on HCTZ.  Lungs CTAB.   - Potassium level is 3.1, K-Dur ordered for repletion. Will follow up tomorrow. - Already on oral iron for anemia, no symptoms currently. Continue inpatient observation for now.   Jaynie Collins, MD, FACOG Attending Obstetrician & Gynecologist Faculty Practice, [facility]

## 2015-05-25 NOTE — Progress Notes (Addendum)
Reported BP 186/88 to Dr. Jonathon JordanGambino. Confirmed new order of additional dose of Lasix and new Apresoline order. Continue with 10mg  Apresoline as ordered and Lasix. Will continue to monitor.

## 2015-05-25 NOTE — Progress Notes (Signed)
0725 Received call from Jimmy @2D  Echo lab verifying order - to be done today.

## 2015-05-26 DIAGNOSIS — O99325 Drug use complicating the puerperium: Secondary | ICD-10-CM

## 2015-05-26 DIAGNOSIS — D649 Anemia, unspecified: Secondary | ICD-10-CM

## 2015-05-26 DIAGNOSIS — O1415 Severe pre-eclampsia, complicating the puerperium: Secondary | ICD-10-CM

## 2015-05-26 DIAGNOSIS — Z888 Allergy status to other drugs, medicaments and biological substances status: Secondary | ICD-10-CM

## 2015-05-26 DIAGNOSIS — O9081 Anemia of the puerperium: Secondary | ICD-10-CM

## 2015-05-26 DIAGNOSIS — F17210 Nicotine dependence, cigarettes, uncomplicated: Secondary | ICD-10-CM

## 2015-05-26 LAB — COMPREHENSIVE METABOLIC PANEL
ALK PHOS: 115 U/L (ref 38–126)
ALT: 85 U/L — AB (ref 14–54)
AST: 41 U/L (ref 15–41)
Albumin: 2.4 g/dL — ABNORMAL LOW (ref 3.5–5.0)
Anion gap: 5 (ref 5–15)
BILIRUBIN TOTAL: 1.2 mg/dL (ref 0.3–1.2)
BUN: 10 mg/dL (ref 6–20)
CALCIUM: 7.8 mg/dL — AB (ref 8.9–10.3)
CO2: 29 mmol/L (ref 22–32)
CREATININE: 1.04 mg/dL — AB (ref 0.44–1.00)
Chloride: 106 mmol/L (ref 101–111)
Glucose, Bld: 97 mg/dL (ref 65–99)
Potassium: 3.4 mmol/L — ABNORMAL LOW (ref 3.5–5.1)
Sodium: 140 mmol/L (ref 135–145)
Total Protein: 4.9 g/dL — ABNORMAL LOW (ref 6.5–8.1)

## 2015-05-26 LAB — CBC
HEMATOCRIT: 27.7 % — AB (ref 36.0–46.0)
Hemoglobin: 9 g/dL — ABNORMAL LOW (ref 12.0–15.0)
MCH: 28.8 pg (ref 26.0–34.0)
MCHC: 32.5 g/dL (ref 30.0–36.0)
MCV: 88.5 fL (ref 78.0–100.0)
Platelets: 186 10*3/uL (ref 150–400)
RBC: 3.13 MIL/uL — AB (ref 3.87–5.11)
RDW: 13.8 % (ref 11.5–15.5)
WBC: 7.5 10*3/uL (ref 4.0–10.5)

## 2015-05-26 MED ORDER — HYDROCHLOROTHIAZIDE 25 MG PO TABS: 25.0000 mg | ORAL_TABLET | Freq: Every day | ORAL | Status: DC

## 2015-05-26 MED ORDER — AMLODIPINE BESYLATE 5 MG PO TABS
5.0000 mg | ORAL_TABLET | Freq: Every day | ORAL | Status: DC
  Administered 2015-05-26: 5 mg via ORAL
  Filled 2015-05-26 (×2): qty 1

## 2015-05-26 MED ORDER — BENAZEPRIL HCL 10 MG PO TABS: 10.0000 mg | ORAL_TABLET | Freq: Every day | ORAL | Status: DC

## 2015-05-26 MED ORDER — AMLODIPINE BESYLATE 5 MG PO TABS: 5.0000 mg | ORAL_TABLET | Freq: Every day | ORAL | Status: DC

## 2015-05-26 NOTE — Progress Notes (Signed)
Pt is discharged in the care of Friend with R.N. Escort. Discharged instructions with instructions were given to pt. With good understanding, Stable Denies headache,dizziness or blurred vision, Stable. Spirits are good. No equipment needed for home use.

## 2015-05-26 NOTE — Plan of Care (Signed)
Problem: Physical Regulation: Goal: Will remain free of preeclampsia complications Outcome: Not Progressing Blood pressure elevated, requiring PRN medication

## 2015-05-26 NOTE — Lactation Note (Signed)
Lactation Consultation Note  Mom was readmitted to hospital and was breastfeeding at discharge.  I visited mom to see if I could assist her with pumping.  She states she has decided to formula feed baby.  Breasts are full but not engorged.  Recommended ice packs if breasts become firm and painful.  Patient Name: Edilia BoChristy A Whitmill ZOXWR'UToday's Date: 05/26/2015     Maternal Data    Feeding    LATCH Score/Interventions                      Lactation Tools Discussed/Used     Consult Status      Huston FoleyMOULDEN, Channing Yeager S 05/26/2015, 11:22 AM

## 2015-05-26 NOTE — Discharge Summary (Signed)
Physician Discharge Summary  Patient ID: Natasha Bean MRN: [id number] DOB/AGE: 07-31-1985 30 y.o.  Admit date: 05/24/2015 Discharge date: 05/26/2015  Admission Diagnoses: Postpartum Preeclampsia  Discharge Diagnoses:  Principal Problem:   Severe preeclampsia in postpartum period Active Problems:   Supervision of other high-risk pregnancy and postpartum period   Marijuana use   Anemia, postpartum   Status post vaginal delivery on 05/17/2015  Discharged Condition: good  Hospital Course: Natasha Bean is a 30 y.o. G1P1001 who presented PPD#5 with grossly elevated blood pressures, HA, RUQ pain and found to have lab values supportive of postpartum preeclampsia with severe features. Her pregnancy was complicated by IOL for preeclampsia without severe features.  She was admitted and placed on 24 hours of magnesium. She also complained of shortness of breath and BNP was elevated at 589 but echocardiogram showed a normal EF. She was started on multiple oral antihypertensives and necessitated IV labetalol and hydralazine while in the hospital. She was deemed medically stable and discharged on 3 oral antihypertensives (HCTZ, Benezpril, Amlodipine). She will have close follow up at [facility] tomorrow (Friday 3/17) at 10:30 and Monday 3/20 at 1030.  Consults: None  Significant Diagnostic Studies:  Hepatic Function Latest Ref Rng 05/26/2015 05/25/2015 05/24/2015  Total Protein 6.5 - 8.1 g/dL 4.9(L) 5.1(L) 5.9(L)  Albumin 3.5 - 5.0 g/dL 2.4(L) 2.3(L) 2.8(L)  AST 15 - 41 U/L 41 105(H) 140(H)  ALT 14 - 54 U/L 85(H) 116(H) 107(H)  Alk Phosphatase 38 - 126 U/L 115 114 151(H)  Total Bilirubin 0.3 - 1.2 mg/dL 1.2 0.8 0.9    Treatments: IV hydration and antihypertensives (IV Labetalol, hydralazine, HCTZ, Amlodipine, Benazepril)  Discharge Exam: Blood pressure 163/70, pulse 68, temperature 98.2 F (36.8 C), temperature source Oral, resp. rate 18, height 5' 2"  (1.575 m), weight 192 lb 0.6 oz (87.109  kg), SpO2 98 %, not currently breastfeeding. General appearance: alert, cooperative and appears stated age Chest wall: no tenderness Cardio: regular rate and rhythm, S1, S2 normal, no murmur, click, rub or gallop GI: soft, non-tender; bowel sounds normal; no masses,  no organomegaly Extremities: extremities normal, atraumatic, no cyanosis or edema and edema not presenting Pulses: 2+ and symmetric  Disposition: 01-Home or Self Care  Discharge Instructions    Diet - low sodium heart healthy    Complete by:  As directed      Discharge instructions    Complete by:  As directed   You must return to the hospital for headache that does not improve with tylenol, blurry vision, changes in vision, pain in the right upper part of your abdomen, increased swelling, shortness of breath, nausea/vomiting or any other concern.            Medication List    STOP taking these medications        ibuprofen 600 MG tablet  Commonly known as:  ADVIL,MOTRIN      TAKE these medications        amLODipine 5 MG tablet  Commonly known as:  NORVASC  Take 1 tablet (5 mg total) by mouth daily.     benazepril 10 MG tablet  Commonly known as:  LOTENSIN  Take 1 tablet (10 mg total) by mouth daily.     ferrous sulfate 325 (65 FE) MG tablet  Take 1 tablet (325 mg total) by mouth 2 (two) times daily with a meal.     hydrochlorothiazide 25 MG tablet  Commonly known as:  HYDRODIURIL  Take 1 tablet (25 mg  total) by mouth daily.     multivitamin-prenatal 27-0.8 MG Tabs tablet  Take 1 tablet by mouth daily at 12 noon.     pantoprazole 20 MG tablet  Commonly known as:  PROTONIX  Take 1 tablet (20 mg total) by mouth daily.           Follow-up Information    Follow up with [facility] In 1 day.   Specialty:  Obstetrics and Gynecology   Why:  Blood pressure check   Contact information:   [address] [place] [phone]      Follow up with [facility]  OB-[facility] In 3 days.   Specialty:  Obstetrics and Gynecology   Why:  Blood pressure check   Contact information:   [address] [phone]      Signed: Caren Macadam 05/26/2015, 12:23 PM

## 2015-05-26 NOTE — Discharge Instructions (Signed)

## 2015-05-27 ENCOUNTER — Ambulatory Visit (INDEPENDENT_AMBULATORY_CARE_PROVIDER_SITE_OTHER): Payer: Medicaid Other | Admitting: Obstetrics and Gynecology

## 2015-05-27 ENCOUNTER — Encounter: Payer: Self-pay | Admitting: Obstetrics and Gynecology

## 2015-05-27 VITALS — BP 198/112 | Ht 62.0 in | Wt 191.0 lb

## 2015-05-27 DIAGNOSIS — O1415 Severe pre-eclampsia, complicating the puerperium: Secondary | ICD-10-CM

## 2015-05-27 DIAGNOSIS — O165 Unspecified maternal hypertension, complicating the puerperium: Secondary | ICD-10-CM

## 2015-05-27 MED ORDER — AMLODIPINE BESYLATE 10 MG PO TABS: 10.0000 mg | ORAL_TABLET | Freq: Every day | ORAL | Status: DC

## 2015-05-27 MED ORDER — BENAZEPRIL HCL 20 MG PO TABS: 40.0000 mg | ORAL_TABLET | Freq: Every day | ORAL | Status: DC

## 2015-05-27 NOTE — Progress Notes (Signed)
[facility] Visit  Patient name: Natasha Bean MRN [id number]  Date of birth: 03/04/1986  CC & HPI: bp recheck 24 hours after  Hospital care discharge  Natasha Bean is a 30 y.o. female presenting today for blood pressure check. She notes that she has been taking her HTN medications as Rx to her. Pt last took her amlodipine at 9:30 and HCTZ and benazepril at 7 AM this morning. Pt was seen in the office on 05/24/15 for blood pressure re-check, [facility] or [facility]. [facility] and discharged on 05/26/15 and dx with post-partum pre-eclampsia.  ROS:  + Elevated blood pressure  Pertinent History Reviewed:   Reviewed: Significant for HTN Medical         Past Medical History  Diagnosis Date  . Asthma   . Anemia   . Migraine   . Vaginal discharge during pregnancy in second trimester 01/06/2015  . Trichimoniasis 01/06/2015    Repeat neg- 01/17/15  . Round ligament pain 01/06/2015  . Hematuria 01/06/2015  . Urinary frequency 01/06/2015  . Pregnancy induced hypertension   . History of marijuana use 11/01/2014    Repeat UDS- Neg (04/19/2015)                              Surgical Hx:    Past Surgical History  Procedure Laterality Date  . No past surgeries     Medications: Reviewed & Updated - see associated section                       Current outpatient prescriptions:  .  amLODipine (NORVASC) 5 MG tablet, Take 1 tablet (5 mg total) by mouth daily., Disp: 30 tablet, Rfl: 2 .  benazepril (LOTENSIN) 10 MG tablet, Take 1 tablet (10 mg total) by mouth daily., Disp: 30 tablet, Rfl: 1 .  ferrous sulfate 325 (65 FE) MG tablet, Take 1 tablet (325 mg total) by mouth 2 (two) times daily with a meal. (Patient taking differently: Take 325 mg by mouth 3 (three) times daily with meals. ), Disp: 60 tablet, Rfl: 3 .  hydrochlorothiazide (HYDRODIURIL) 25 MG tablet, Take 1 tablet (25 mg total) by mouth  daily., Disp: 30 tablet, Rfl: 2 .  pantoprazole (PROTONIX) 20 MG tablet, Take 1 tablet (20 mg total) by mouth daily. (Patient taking differently: Take 20 mg by mouth daily as needed for heartburn. ), Disp: 30 tablet, Rfl: 3 .  Prenatal Vit-Fe Fumarate-FA (MULTIVITAMIN-PRENATAL) 27-0.8 MG TABS tablet, Take 1 tablet by mouth daily at 12 noon., Disp: , Rfl:    Social History: Reviewed -  reports that she has been smoking Cigarettes.  She has a 2.5 pack-year smoking history. She has never used smokeless tobacco.  Objective Findings:  Vitals: Blood pressure 198/112, height 5\' 2"  (1.575 m), weight 191 lb (86.637 kg), not currently breastfeeding.  Physical Examination:  General appearance - alert, well appearing, and in no distress and oriented to person, place, and time Mental status - alert, oriented to person, place, and time, normal mood, behavior, speech, dress, motor activity, and thought processes Neurological - alert, oriented, normal speech, no focal findings or movement disorder noted, DTR's normal and symmetric Extremities - peripheral pulses normal, no pedal edema, no clubbing or cyanosis, reflexes 1+    Assessment & Plan:   A:  1. Uncontrolled severe postpartum HTN  P:  1. Increase Lotensin to 40 mg and Norvasc to 10 mg , continue HCTz 2. Repeat lab work today, Personnel officer, results : improving LFT's 3. F/u in office Monday, 05/30/15  By signing my name below, I, Soijett Blue, attest that this documentation has been prepared under the direction and in the presence of Tilda Burrow, MD. Electronically Signed: Soijett Blue, ED Scribe. 05/27/2015. 11:10 AM.  I personally performed the services described in this documentation, which was SCRIBED in my presence. The recorded information has been reviewed and considered accurate. It has been edited as necessary during review. Tilda Burrow, MD

## 2015-05-28 LAB — COMPREHENSIVE METABOLIC PANEL
ALBUMIN: 3.3 g/dL — AB (ref 3.5–5.5)
ALK PHOS: 138 IU/L — AB (ref 39–117)
ALT: 62 IU/L — ABNORMAL HIGH (ref 0–32)
AST: 25 IU/L (ref 0–40)
Albumin/Globulin Ratio: 1.2 (ref 1.2–2.2)
BILIRUBIN TOTAL: 1.3 mg/dL — AB (ref 0.0–1.2)
BUN / CREAT RATIO: 11 (ref 8–20)
BUN: 12 mg/dL (ref 6–20)
CHLORIDE: 100 mmol/L (ref 96–106)
CO2: 23 mmol/L (ref 18–29)
Calcium: 8.5 mg/dL — ABNORMAL LOW (ref 8.7–10.2)
Creatinine, Ser: 1.11 mg/dL — ABNORMAL HIGH (ref 0.57–1.00)
GFR calc non Af Amer: 66 mL/min/{1.73_m2} (ref >59)
GFR, EST AFRICAN AMERICAN: 76 mL/min/{1.73_m2} (ref >59)
GLOBULIN, TOTAL: 2.7 g/dL (ref 1.5–4.5)
Glucose: 74 mg/dL (ref 65–99)
Potassium: 4.6 mmol/L (ref 3.5–5.2)
SODIUM: 141 mmol/L (ref 134–144)
TOTAL PROTEIN: 6 g/dL (ref 6.0–8.5)

## 2015-05-29 ENCOUNTER — Telehealth: Payer: Self-pay | Admitting: Obstetrics and Gynecology

## 2015-05-29 NOTE — Telephone Encounter (Signed)
Pt informed of improving LFT's. Pt says she is feeling fine, is not checking her BP, as she doesn't have a cuff, Pt has f/u appt in a.m. For bp recheck.

## 2015-05-30 ENCOUNTER — Encounter: Payer: Self-pay | Admitting: Obstetrics & Gynecology

## 2015-05-30 ENCOUNTER — Ambulatory Visit (INDEPENDENT_AMBULATORY_CARE_PROVIDER_SITE_OTHER): Payer: Medicaid Other | Admitting: Obstetrics & Gynecology

## 2015-05-30 VITALS — BP 140/100 | HR 72 | Wt 184.0 lb

## 2015-05-30 DIAGNOSIS — O1415 Severe pre-eclampsia, complicating the puerperium: Secondary | ICD-10-CM

## 2015-05-30 NOTE — Progress Notes (Signed)
Patient ID: Natasha Bean, female   DOB: November 07, 1985, 30 y.o.   MRN: [id number]   Chief Complaint  Patient presents with  . Blood Pressure Check    Blood pressure 140/100, pulse 72, weight 184 lb (83.462 kg), not currently breastfeeding.   BP doing better on these meds Continue and recheck in 2 weeks If any othe rproblems arise follow up  Lazaro Arms, MD    [facility] Visit  Patient name: Natasha Bean MRN [id number]  Date of birth: 1985-05-19  CC & HPI: bp recheck 24 hours after  Hospital care discharge  Natasha Bean is a 30 y.o. female presenting today for blood pressure check. She notes that she has been taking her HTN medications as Rx to her. Pt last took her amlodipine at 9:30 and HCTZ and benazepril at 7 AM this morning. Pt was seen in the office on 05/24/15 for blood pressure re-check, pt was informed to go to [facility] to rule out PE or cardiomyopathy. Pt was admitted to [facility] on 05/24/15 and discharged on 05/26/15 and dx with post-partum pre-eclampsia.  ROS:  + Elevated blood pressure  Pertinent History Reviewed:   Reviewed: Significant for HTN Medical         Past Medical History  Diagnosis Date  . Asthma   . Anemia   . Migraine   . Vaginal discharge during pregnancy in second trimester 01/06/2015  . Trichimoniasis 01/06/2015    Repeat neg- 01/17/15  . Round ligament pain 01/06/2015  . Hematuria 01/06/2015  . Urinary frequency 01/06/2015  . Pregnancy induced hypertension   . History of marijuana use 11/01/2014    Repeat UDS- Neg (04/19/2015)                              Surgical Hx:    Past Surgical History  Procedure Laterality Date  . No past surgeries     Medications: Reviewed & Updated - see associated section                       Current outpatient prescriptions:  .  amLODipine (NORVASC) 10 MG tablet, Take 1 tablet (10 mg total) by mouth daily., Disp: 30 tablet, Rfl: 1 .  benazepril (LOTENSIN) 20 MG tablet, Take 2  tablets (40 mg total) by mouth daily., Disp: 60 tablet, Rfl: 1 .  ferrous sulfate 325 (65 FE) MG tablet, Take 1 tablet (325 mg total) by mouth 2 (two) times daily with a meal. (Patient taking differently: Take 325 mg by mouth 3 (three) times daily with meals. ), Disp: 60 tablet, Rfl: 3 .  hydrochlorothiazide (HYDRODIURIL) 25 MG tablet, Take 1 tablet (25 mg total) by mouth daily., Disp: 30 tablet, Rfl: 2 .  pantoprazole (PROTONIX) 20 MG tablet, Take 1 tablet (20 mg total) by mouth daily. (Patient taking differently: Take 20 mg by mouth daily as needed for heartburn. ), Disp: 30 tablet, Rfl: 3 .  Prenatal Vit-Fe Fumarate-FA (MULTIVITAMIN-PRENATAL) 27-0.8 MG TABS tablet, Take 1 tablet by mouth daily at 12 noon., Disp: , Rfl:    Social History: Reviewed -  reports that she has been smoking Cigarettes.  She has a 2.5 pack-year smoking history. She has never used smokeless tobacco.  Objective Findings:  Vitals: Blood pressure 140/100, pulse 72, weight 184 lb (83.462 kg), not currently breastfeeding.  Physical Examination:  General appearance - alert, well appearing, and in no  distress and oriented to person, place, and time Mental status - alert, oriented to person, place, and time, normal mood, behavior, speech, dress, motor activity, and thought processes Neurological - alert, oriented, normal speech, no focal findings or movement disorder noted, DTR's normal and symmetric Extremities - peripheral pulses normal, no pedal edema, no clubbing or cyanosis, reflexes 1+    Assessment & Plan:   A:  1. Uncontrolled severe postpartum HTN  P:  1. Increase Lotensin to 40 mg and Norvasc to 10 mg , continue HCTz 2. Repeat lab work today, Personnel officerCmet, results : improving LFT's 3. F/u in office Monday, 05/30/15  By signing my name below, I, Soijett Blue, attest that this documentation has been prepared under the direction and in the presence of Tilda BurrowJohn Ferguson V, MD. Electronically Signed: Soijett Blue, ED Scribe.  05/27/2015. 11:10 AM.  I personally performed the services described in this documentation, which was SCRIBED in my presence. The recorded information has been reviewed and considered accurate. It has been edited as necessary during review. Lazaro ArmsEURE,Schylar Allard H, MD

## 2015-05-31 ENCOUNTER — Encounter: Payer: Medicaid Other | Admitting: Obstetrics & Gynecology

## 2015-06-01 ENCOUNTER — Telehealth: Payer: Self-pay | Admitting: *Deleted

## 2015-06-01 NOTE — Telephone Encounter (Signed)
Clydie BraunKaren, [facility], states pt blood pressure today 152/110 right arm, 140/110 left arm, pt taking her Norvasc 10 mg daily, Lotensin 20 mg, 2 tablet daily, HCTZ 25 mg daily. Clydie BraunKaren stated pt had taken her B/P medication this am.    Dr. Emelda FearFerguson notified of pt's elevated blood pressures per Clydie BraunKaren, [facility] Dr. Emelda FearFerguson gave verbal order for pt to take the Lotensin 40 mg po BID, once in am and then in pm, and continue HCTZ and Norvasc as prescribed. Pt given an appt for 06/03/2015 @ 11 am for reevaluation of blood pressures. Pt verbalized understanding.

## 2015-06-03 ENCOUNTER — Ambulatory Visit (INDEPENDENT_AMBULATORY_CARE_PROVIDER_SITE_OTHER): Payer: Medicaid Other | Admitting: Adult Health

## 2015-06-03 ENCOUNTER — Encounter: Payer: Self-pay | Admitting: Adult Health

## 2015-06-03 VITALS — BP 130/90 | HR 80 | Ht 62.0 in | Wt 180.0 lb

## 2015-06-03 DIAGNOSIS — M546 Pain in thoracic spine: Secondary | ICD-10-CM | POA: Diagnosis not present

## 2015-06-03 DIAGNOSIS — O165 Unspecified maternal hypertension, complicating the puerperium: Secondary | ICD-10-CM | POA: Diagnosis not present

## 2015-06-03 MED ORDER — CYCLOBENZAPRINE HCL 5 MG PO TABS: 5.0000 mg | ORAL_TABLET | Freq: Three times a day (TID) | ORAL | Status: DC | PRN

## 2015-06-03 NOTE — Progress Notes (Signed)
Subjective:     Patient ID: Natasha Bean, female   DOB: 07/30/1985, 30 y.o.   MRN: [id number]  HPI Natasha Bean is a 30 year old black female, who delivered 05/19/15 vaginally, and was admitted 3/14 to 3/16 for postpartum hypertension and ? Pre eclampsia , and had follow up 3/17 and 3/20 on BP, but has continued to be elevated, but she says she feels better, but back hurts since delivery, had epidural.   Review of Systems  + back pain, denies any headache or RUQ pain, no swelling  Reviewed past medical,surgical, social and family history. Reviewed medications and allergies.     Objective:   Physical Exam BP 130/90 mmHg  Pulse 80  Ht 5\' 2"  (1.575 m)  Wt 180 lb (81.647 kg)  BMI 32.91 kg/m2  Breastfeeding? No DTRs 2+ no clonus, no swelling in feet or ankles, has some tenderness along mid spine area, discussed trying ice/heat and flexeril and support, will continue same dose of BP meds and follow up in 1 week    Assessment:     Postpartum hypertension Midline thoracic back pain     Plan:    Continue current dose of BP meds, Norvasc 10 mg 1 daily, Lotensin 40 mg bid and hydrodiuril 25 mg daily  Rx flexeril 5 mg #30 take 1 every 8 hours prn Use ice and heat, support arms and back when holding baby Increase rest and increase water Follow up in 1 week or before if needed

## 2015-06-03 NOTE — Patient Instructions (Signed)
Continue BP med Use flexeril and ice and support arm Follow up in 1 week

## 2015-06-10 ENCOUNTER — Encounter: Payer: Self-pay | Admitting: Adult Health

## 2015-06-10 ENCOUNTER — Ambulatory Visit (INDEPENDENT_AMBULATORY_CARE_PROVIDER_SITE_OTHER): Payer: Medicaid Other | Admitting: Adult Health

## 2015-06-10 VITALS — BP 108/70 | HR 76 | Ht 62.0 in | Wt 178.0 lb

## 2015-06-10 DIAGNOSIS — Z30013 Encounter for initial prescription of injectable contraceptive: Secondary | ICD-10-CM | POA: Diagnosis not present

## 2015-06-10 DIAGNOSIS — O165 Unspecified maternal hypertension, complicating the puerperium: Secondary | ICD-10-CM | POA: Diagnosis not present

## 2015-06-10 DIAGNOSIS — Z309 Encounter for contraceptive management, unspecified: Secondary | ICD-10-CM

## 2015-06-10 HISTORY — DX: Encounter for contraceptive management, unspecified: Z30.9

## 2015-06-10 MED ORDER — MEDROXYPROGESTERONE ACETATE 150 MG/ML IM SUSP: 150.0000 mg | INTRAMUSCULAR | Status: DC

## 2015-06-10 MED ORDER — HYDROCHLOROTHIAZIDE 25 MG PO TABS: 25.0000 mg | ORAL_TABLET | Freq: Every day | ORAL | Status: DC

## 2015-06-10 MED ORDER — AMLODIPINE BESYLATE 10 MG PO TABS: 10.0000 mg | ORAL_TABLET | Freq: Every day | ORAL | Status: DC

## 2015-06-10 MED ORDER — BENAZEPRIL HCL 20 MG PO TABS: 40.0000 mg | ORAL_TABLET | Freq: Every day | ORAL | Status: DC

## 2015-06-10 NOTE — Progress Notes (Signed)
Subjective:     Patient ID: Natasha Bean, female   DOB: 02/28/1986, 30 y.o.   MRN: [id number]  HPI Natasha Bean is a 30 year old black female, back in follow up for on BP, has had postpartum hypertension, she says it is better and she feels good, no complaints, but she does want to get depo for her birth control.   Review of Systems Patient denies any headaches, hearing loss, fatigue, blurred vision, shortness of breath, chest pain, abdominal pain, problems with bowel movements, urination, or intercourse(no sex yet). No joint pain or mood swings. Reviewed past medical,surgical, social and family history. Reviewed medications and allergies.     Objective:   Physical Exam BP 108/70 mmHg  Pulse 76  Ht 5\' 2"  (1.575 m)  Wt 178 lb (80.74 kg)  BMI 32.55 kg/m2  Breastfeeding? No Skin warm and dry. Neck: mid line trachea, normal thyroid, good ROM, no lymphadenopathy noted. Lungs: clear to ausculation bilaterally. Cardiovascular: regular rate and rhythm.No swelling in feet and ankles.Discussed with Dr Emelda FearFerguson and he wants her to continue BP meds for now. But only take lotensin once a day now.  Face time 15 minutes.    Assessment:     Postpartum hypertension Contraceptive management     Plan:      Refilled norvasc 10 mg #30 take 1 daily with 3 refills Refilled lotensin 20 mg#60 take 2 in am with 3 refills Refilled hydrodiuril 25 mg #30 take 1 daily with 3 refills Rx Depo provera 150 mg, disp.# 1 vail for IM injection every 3 months in office with 4 refills Follow up as scheduled for postpartum visit

## 2015-06-10 NOTE — Patient Instructions (Signed)
Follow up as scheduled.  

## 2015-06-13 ENCOUNTER — Ambulatory Visit: Payer: Medicaid Other | Admitting: Obstetrics & Gynecology

## 2015-07-05 ENCOUNTER — Encounter: Payer: Self-pay | Admitting: Women's Health

## 2015-07-05 ENCOUNTER — Ambulatory Visit (INDEPENDENT_AMBULATORY_CARE_PROVIDER_SITE_OTHER): Payer: Medicaid Other | Admitting: Women's Health

## 2015-07-05 DIAGNOSIS — R748 Abnormal levels of other serum enzymes: Secondary | ICD-10-CM

## 2015-07-05 DIAGNOSIS — O165 Unspecified maternal hypertension, complicating the puerperium: Secondary | ICD-10-CM

## 2015-07-05 NOTE — Patient Instructions (Signed)
NO SEX UNTIL AFTER YOU GET YOUR BIRTH CONTROL   Stop Lotensin (benazepril)

## 2015-07-05 NOTE — Progress Notes (Signed)
Patient ID: Natasha Bean, female   DOB: 10/21/1985, 30 y.o.   MRN: [id number] Subjective:    Natasha Bean is a 30 y.o. 651P1001 African American female who presents for a postpartum visit. She is 6 weeks postpartum following a spontaneous vaginal delivery at 37.1 gestational weeks after IOL for pre-e w/o severe features. Anesthesia: epidural. I have fully reviewed the prenatal and intrapartum course. Postpartum course has been complicated by readmission to [facility] 5d pp for pre-e w/ severe features, received 24hr magnesium, required iv bp's meds, normal echo, d/c'd 2d later on 3 oral bp agents, which have since had to be titrated up to control bp's. She is currently taking norvasc 10mg  daily, lotensin 40mg  daily, and hctz 25mg  daily. She denies h/o HTN prior to pregnancy. LFTs were elevated on readmission and have followed since d/c, last ALT was still elevated at 62 on 3/17, but trending down. Still having some LBP she attributes to epidural. Baby's course has been complicated by 'seizure like activity' that began few days after initial d/c from hospital- seeing neurologist on Thurs. Baby is feeding by bottle. Bleeding no bleeding. Patient's last menstrual period was 06/28/2015. Bowel function is normal. Bladder function is normal. Patient is sexually active. Last sexual activity: last night. Contraception method is none and wants depo. Postpartum depression screening: negative. Score 3.  Last pap 11/01/14 and was neg w/ -HRHPV.  The following portions of the patient's history were reviewed and updated as appropriate: allergies, current medications, past medical history, past surgical history and problem list.  Review of Systems Pertinent items are noted in HPI.   Filed Vitals:   07/05/15 0953  BP: 108/60  Pulse: 60  Weight: 170 lb (77.111 kg)   Patient's last menstrual period was 06/28/2015.  Objective:   General:  alert, cooperative and no distress   Breasts:  deferred, no complaints   Lungs: clear to auscultation bilaterally  Heart:  regular rate and rhythm  Abdomen: soft, nontender   Vulva: normal  Vagina: normal vagina  Cervix:  closed  Corpus: Well-involuted  Adnexa:  Non-palpable  Rectal Exam: No hemorrhoids        Assessment:   Postpartum exam 6 wks s/p SVB after IOL for pre-w w/o severe features Readmitted 5d pp for pp severe pre-e Low back pain Bottlefeeding Depression screening Contraception counseling   Plan:  Discussed bp meds w/ LHE, to d/c lotensin today, f/u in 2wks- if bp good then d/c norvasc and f/u in 2wks- if bp good then d/c hctz and f/u in 2wks. Discussed plan w/ pt D/C Lotensin today Ibuprofen prn back pain, ice/heat whichever feels better, massage CMP today Contraception: abstinence until depo, already has rx Follow up in: 5/4 (10d from last sex) for bhcg am, then pm for f/u visit/bp check and depo shot   Natasha Bean, Natasha Bean CNM, [facility]NP[facility]BC 07/05/2015 10:14 AM

## 2015-07-06 LAB — COMPREHENSIVE METABOLIC PANEL
ALK PHOS: 59 IU/L (ref 39–117)
ALT: 11 IU/L (ref 0–32)
AST: 12 IU/L (ref 0–40)
Albumin/Globulin Ratio: 1.6 (ref 1.2–2.2)
Albumin: 4.4 g/dL (ref 3.5–5.5)
BILIRUBIN TOTAL: 0.8 mg/dL (ref 0.0–1.2)
BUN / CREAT RATIO: 12 (ref 9–23)
BUN: 17 mg/dL (ref 6–20)
CHLORIDE: 99 mmol/L (ref 96–106)
CO2: 24 mmol/L (ref 18–29)
Calcium: 9.7 mg/dL (ref 8.7–10.2)
Creatinine, Ser: 1.38 mg/dL — ABNORMAL HIGH (ref 0.57–1.00)
GFR calc Af Amer: 59 mL/min/{1.73_m2} — ABNORMAL LOW (ref >59)
GFR calc non Af Amer: 51 mL/min/{1.73_m2} — ABNORMAL LOW (ref >59)
GLUCOSE: 90 mg/dL (ref 65–99)
Globulin, Total: 2.8 g/dL (ref 1.5–4.5)
Potassium: 4 mmol/L (ref 3.5–5.2)
SODIUM: 140 mmol/L (ref 134–144)
TOTAL PROTEIN: 7.2 g/dL (ref 6.0–8.5)

## 2015-07-14 ENCOUNTER — Other Ambulatory Visit: Payer: Medicaid Other

## 2015-07-14 ENCOUNTER — Ambulatory Visit: Payer: Medicaid Other | Admitting: Advanced Practice Midwife

## 2015-07-14 ENCOUNTER — Ambulatory Visit: Payer: Medicaid Other

## 2015-07-14 DIAGNOSIS — Z3042 Encounter for surveillance of injectable contraceptive: Secondary | ICD-10-CM

## 2015-07-14 LAB — BETA HCG QUANT (REF LAB)

## 2015-07-26 ENCOUNTER — Other Ambulatory Visit: Payer: Self-pay | Admitting: Obstetrics and Gynecology

## 2015-07-27 ENCOUNTER — Telehealth: Payer: Self-pay | Admitting: Women's Health

## 2015-07-27 NOTE — Telephone Encounter (Signed)
Neysa BonitoChristy has an Rx until 05/2016, so I didn't fill this,as it's redundant

## 2015-07-27 NOTE — Telephone Encounter (Signed)
Pt returned call, has been busy- her dad had a stroke last week and isn't doing well. Discussed abnormal kidney function tests last visit and need to recheck bp after coming off 1 bp med- switched to front to schedule f/u. States no problems voiding, not concentrated, no decreased frequency. Cheral MarkerKimberly R. Darrnell Mangiaracina, CNM, WHNP-BC 07/27/2015 2:30 PM

## 2015-07-27 NOTE — Telephone Encounter (Signed)
Calling to check on pt, missed f/u appt for bp check/depo shot. Had elevated creatinine and decreased GFR on CMP at last visit- had discussed w/ LHE and thought result of bp meds, need to f/u. Left message.  Cheral MarkerKimberly R. Kimberle Stanfill, CNM, WHNP-BC 07/27/2015 1:22 PM

## 2015-08-02 ENCOUNTER — Ambulatory Visit: Payer: Medicaid Other | Admitting: Women's Health

## 2015-08-03 ENCOUNTER — Ambulatory Visit (INDEPENDENT_AMBULATORY_CARE_PROVIDER_SITE_OTHER): Payer: Medicaid Other | Admitting: Women's Health

## 2015-08-03 ENCOUNTER — Encounter: Payer: Self-pay | Admitting: Women's Health

## 2015-08-03 VITALS — BP 162/96 | HR 84 | Wt 179.0 lb

## 2015-08-03 DIAGNOSIS — Z3009 Encounter for other general counseling and advice on contraception: Secondary | ICD-10-CM | POA: Diagnosis not present

## 2015-08-03 DIAGNOSIS — I10 Essential (primary) hypertension: Secondary | ICD-10-CM

## 2015-08-03 DIAGNOSIS — R748 Abnormal levels of other serum enzymes: Secondary | ICD-10-CM

## 2015-08-03 DIAGNOSIS — O165 Unspecified maternal hypertension, complicating the puerperium: Secondary | ICD-10-CM

## 2015-08-03 DIAGNOSIS — R7989 Other specified abnormal findings of blood chemistry: Secondary | ICD-10-CM

## 2015-08-03 MED ORDER — AMLODIPINE BESYLATE 10 MG PO TABS: 10.0000 mg | ORAL_TABLET | Freq: Every day | ORAL | Status: DC

## 2015-08-03 MED ORDER — BENAZEPRIL HCL 5 MG PO TABS: 5.0000 mg | ORAL_TABLET | Freq: Every day | ORAL | Status: DC

## 2015-08-03 NOTE — Progress Notes (Signed)
Patient ID: Gerilyn NestleChristy A Dern, female   DOB: 11/15/1985, 30 y.o.   MRN: [id number]   [facility]  Patient name: Gerilyn NestleChristy A Rother MRN [id number]  Date of birth: 04/27/1985  CC & HPI:  Edilia BoChristy A Stachowski is a 30 y.o. 541P1001 African American female presenting today for bp check after coming off of benazepril 40mg  daily 4wks ago. She is still taking norvasc 10mg  daily and hctz 25mg  daily.  She was supposed to f/u 2wks ago but her dad had stroke.  She is 11wks s/p SVB after IOL for pre-e w/o severe features. She had readmission 5d pp for pre-e w/ severe featurs, received magnesium x 24hr, required iv bp meds, normal echo, d/c'd 2d later on 3 oral bp agents-which were further titrated up after d/c to control bp's.  LFTs were elevated on readmission and have since returned to normal, however her CMP 4wks ago revealed creatinine trending upwards @ 1.38 (from 1.11 on 3/17) and decreased GFR- discussed w/ LHE at the time and felt was d/t bp meds.  Pt reports no problems voiding, voiding normal amt/frequency.  She denies any h/o HTN prior to the end of pregnancy Hasn't returned for depo shot d/t losing vial from pharmacy, wants to get tomorrow. Has not had sex in >2wks.  Patient's last menstrual period was 07/26/2015. Pertinent History Reviewed:  Medical & Surgical Hx:   Past medical, surgical, family, and social history reviewed in electronic medical record Medications: Reviewed & Updated - see associated section Allergies: Reviewed in electronic medical record  Objective Findings:  Vitals: BP 162/96 mmHg  Pulse 84  Wt 179 lb (81.194 kg)  LMP 07/26/2015  Breastfeeding? No Body mass index is 32.73 kg/(m^2).  Physical Examination: General appearance - alert, well appearing, and in no distress  No results found for this or any previous visit (from the past 24 hour(s)).   Assessment & Plan:  A:   Persistent pp HTN currently on 2 oral agents  Contraception counseling   Elevated serum  creatinine 4wks ago  P:  Discussed w/ LHE, add back benazepril at lowest possible dosage  Rx benazepril 5mg  daily, continue norvasc, hctz  CMP today  Return for tomorrow for depo (no visit), then 2wks F/U w/ LHE.  Marge DuncansBooker, Barbaraann Avans Randall CNM, [facility]NP[facility] 08/03/2015 4:11 PM

## 2015-08-03 NOTE — Patient Instructions (Signed)
Add benazepril 5mg  daily

## 2015-08-04 ENCOUNTER — Ambulatory Visit: Payer: Medicaid Other

## 2015-08-04 LAB — COMPREHENSIVE METABOLIC PANEL
ALBUMIN: 3.9 g/dL (ref 3.5–5.5)
ALT: 8 IU/L (ref 0–32)
AST: 8 IU/L (ref 0–40)
Albumin/Globulin Ratio: 1.6 (ref 1.2–2.2)
Alkaline Phosphatase: 46 IU/L (ref 39–117)
BUN / CREAT RATIO: 9 (ref 9–23)
BUN: 10 mg/dL (ref 6–20)
Bilirubin Total: 0.4 mg/dL (ref 0.0–1.2)
CALCIUM: 9.2 mg/dL (ref 8.7–10.2)
CO2: 20 mmol/L (ref 18–29)
CREATININE: 1.12 mg/dL — AB (ref 0.57–1.00)
Chloride: 104 mmol/L (ref 96–106)
GFR, EST AFRICAN AMERICAN: 76 mL/min/{1.73_m2} (ref >59)
GFR, EST NON AFRICAN AMERICAN: 66 mL/min/{1.73_m2} (ref >59)
GLOBULIN, TOTAL: 2.5 g/dL (ref 1.5–4.5)
Glucose: 82 mg/dL (ref 65–99)
Potassium: 3.7 mmol/L (ref 3.5–5.2)
SODIUM: 143 mmol/L (ref 134–144)
TOTAL PROTEIN: 6.4 g/dL (ref 6.0–8.5)

## 2015-08-18 ENCOUNTER — Ambulatory Visit: Payer: Medicaid Other | Admitting: Obstetrics & Gynecology

## 2015-08-18 ENCOUNTER — Ambulatory Visit (INDEPENDENT_AMBULATORY_CARE_PROVIDER_SITE_OTHER): Payer: Medicaid Other | Admitting: Obstetrics and Gynecology

## 2015-08-18 ENCOUNTER — Encounter: Payer: Self-pay | Admitting: Obstetrics and Gynecology

## 2015-08-18 VITALS — BP 120/82 | Ht 62.0 in | Wt 167.0 lb

## 2015-08-18 DIAGNOSIS — M6281 Muscle weakness (generalized): Secondary | ICD-10-CM | POA: Diagnosis not present

## 2015-08-18 DIAGNOSIS — R748 Abnormal levels of other serum enzymes: Secondary | ICD-10-CM

## 2015-08-18 DIAGNOSIS — I129 Hypertensive chronic kidney disease with stage 1 through stage 4 chronic kidney disease, or unspecified chronic kidney disease: Secondary | ICD-10-CM

## 2015-08-18 DIAGNOSIS — I10 Essential (primary) hypertension: Secondary | ICD-10-CM

## 2015-08-18 DIAGNOSIS — N181 Chronic kidney disease, stage 1: Principal | ICD-10-CM

## 2015-08-18 MED ORDER — BENAZEPRIL HCL 5 MG PO TABS: 5.0000 mg | ORAL_TABLET | Freq: Every day | ORAL | Status: DC

## 2015-08-18 MED ORDER — HYDROCHLOROTHIAZIDE 25 MG PO TABS: 25.0000 mg | ORAL_TABLET | Freq: Every day | ORAL | Status: DC

## 2015-08-18 MED ORDER — MEDROXYPROGESTERONE ACETATE 150 MG/ML IM SUSP: 150.0000 mg | INTRAMUSCULAR | Status: DC

## 2015-08-18 NOTE — Addendum Note (Signed)
Addended by: Tilda BurrowFERGUSON, Klarisa Barman V on: 08/18/2015 02:46 PM   Modules accepted: Orders

## 2015-08-18 NOTE — Progress Notes (Signed)
Patient ID: Natasha Bean, female   DOB: 08/08/1985, 30 y.o.   MRN: [id number]   [facility]  @DATE @            Patient name: Natasha Bean MRN [id number]  Date of birth: 02/21/1986  CC & HPI:  Natasha Bean is a 30 y.o. female presenting today for followup from persistent BP elevation, on 3 meds after delivery now 3 months PP.  ROS:  ROS Complicated pp course with persistent hTN, see notes from 5/24:  Natasha Bean is a 30 y.o. 751P1001 African American female presenting today for bp check after coming off of benazepril 40mg  daily 4wks ago. She is still taking norvasc 10mg  daily and hctz 25mg  daily.  She was supposed to f/u 2wks ago but her dad had stroke.  She is 11wks s/p SVB after IOL for pre-e w/o severe features. She had readmission 5d pp for pre-e w/ severe featurs, received magnesium x 24hr, required iv bp meds, normal echo, d/c'd 2d later on 3 oral bp agents-which were further titrated up after d/c to control bp's. LFTs were elevated on readmission and have since returned to normal, however her CMP 4wks ago revealed creatinine trending upwards @ 1.38 (from 1.11 on 3/17) and decreased GFR- discussed w/ LHE at the time and felt was d/t bp meds.  Pt reports no problems voiding, voiding normal amt/frequency.  She denies any h/o HTN prior to the end of pregnancy Hasn't returned for depo shot d/t losing vial from pharmacy, wants to get tomorrow. Has not had sex in >2wks.  Patient's last menstrual period was 07/26/2015.  PT REPORTS PAIN IN LEFT THIGH LATERAL ASPECTS, BUT LEG GIVING OUT WORSE NOW THAN IMMEDIATELY pp. HAS ?'S IF RELATED TO EPIDURAL. FEELS LIKE SHE COULD FALL, BUT SHE HAS NOT TRIPPED OR FALLEN. THE SENSATION OF WEAKNESS HAS ASSOCIATED PAIN, AND DAILY ACHES, IN LATERAL ASPECT OF OUTER THIGH. WITHOUT RADIATION INTO LOWER LEG.  Pertinent History Reviewed:   Reviewed: Significant for  BMP Latest Ref Rng 08/03/2015 07/05/2015 05/27/2015  Glucose 65 -  99 mg/dL 82 90 74  BUN 6 - 20 mg/dL 10 17 12   Creatinine 0.57 - 1.00 mg/dL 3.47(Q1.12(H) 2.59(D1.38(H) 6.38(V1.11(H)  BUN/Creat Ratio 9 - 23 9 12 11   Sodium 134 - 144 mmol/L 143 140 141  Potassium 3.5 - 5.2 mmol/L 3.7 4.0 4.6  Chloride 96 - 106 mmol/L 104 99 100  CO2 18 - 29 mmol/L 20 24 23   Calcium 8.7 - 10.2 mg/dL 9.2 9.7 5.6(E8.5(L)   Medical         Past Medical History  Diagnosis Date  . Asthma   . Anemia   . Migraine   . Vaginal discharge during pregnancy in second trimester 01/06/2015  . Trichimoniasis 01/06/2015    Repeat neg- 01/17/15  . Round ligament pain 01/06/2015  . Hematuria 01/06/2015  . Urinary frequency 01/06/2015  . Pregnancy induced hypertension   . History of marijuana use 11/01/2014    Repeat UDS- Neg (04/19/2015)  . Contraceptive management 06/10/2015                              Surgical Hx:    Past Surgical History  Procedure Laterality Date  . No past surgeries     Medications: Reviewed & Updated - see associated section  Current outpatient prescriptions:  .  amLODipine (NORVASC) 10 MG tablet, Take 1 tablet (10 mg total) by mouth daily., Disp: 30 tablet, Rfl: 3 .  benazepril (LOTENSIN) 5 MG tablet, Take 1 tablet (5 mg total) by mouth daily., Disp: 30 tablet, Rfl: 3 .  ferrous sulfate 325 (65 FE) MG tablet, Take 1 tablet (325 mg total) by mouth 2 (two) times daily with a meal. (Patient taking differently: Take 325 mg by mouth 3 (three) times daily with meals. ), Disp: 60 tablet, Rfl: 3 .  hydrochlorothiazide (HYDRODIURIL) 25 MG tablet, Take 1 tablet (25 mg total) by mouth daily., Disp: 30 tablet, Rfl: 3 .  ibuprofen (ADVIL,MOTRIN) 600 MG tablet, Take 600 mg by mouth every 6 (six) hours as needed., Disp: , Rfl:  .  Multiple Vitamin (MULTIVITAMIN) tablet, Take 1 tablet by mouth daily., Disp: , Rfl:    Social History: Reviewed -  reports that she has been smoking Cigarettes.  She has a 2.5 pack-year smoking history. She has never used smokeless  tobacco.  Objective Findings:  Vitals: Blood pressure 120/82, height  (1.575 m), weight 167 lb (75.751 kg), last menstrual period 07/26/2015, not currently breastfeeding.  Physical Examination: General appearance - alert, well appearing, and in no distress, oriented to person, place, and time and normal appearing weight Mental status - alert, oriented to person, place, and time, normal mood, behavior, speech, dress, motor activity, and thought processes Eyes - pupils equal and reactive, extraocular eye movements intact STRENGTH NOW 5/5 IN LOWER EXTREMITIES. NORMAL EXAM. NORMAL SENSATION  Assessment & Plan:   A:  1.  Controlled htn on 3 meds. 2.  intermittent Left thigh paresthesia, s/p delivery using epidural now normal exam  P:  1. Continue benazepril and hctz try eliminating norvasc. 2. Recheck BUN.cr Recheck 2 wk.

## 2015-08-19 LAB — BASIC METABOLIC PANEL
BUN / CREAT RATIO: 10 (ref 9–23)
BUN: 19 mg/dL (ref 6–20)
CO2: 22 mmol/L (ref 18–29)
CREATININE: 1.84 mg/dL — AB (ref 0.57–1.00)
Calcium: 9.6 mg/dL (ref 8.7–10.2)
Chloride: 101 mmol/L (ref 96–106)
GFR calc non Af Amer: 36 mL/min/{1.73_m2} — ABNORMAL LOW (ref >59)
GFR, EST AFRICAN AMERICAN: 42 mL/min/{1.73_m2} — AB (ref >59)
Glucose: 92 mg/dL (ref 65–99)
Potassium: 3.7 mmol/L (ref 3.5–5.2)
Sodium: 141 mmol/L (ref 134–144)

## 2015-08-23 ENCOUNTER — Other Ambulatory Visit: Payer: Self-pay | Admitting: Women's Health

## 2015-08-23 ENCOUNTER — Telehealth: Payer: Self-pay | Admitting: Women's Health

## 2015-08-23 NOTE — Telephone Encounter (Signed)
LM for pt to return call. Want to let her know creatinine still elevated, referring to nephrologist Dr. Kristian CoveyBefekadu, let us know if don't hear from them w/in the next week.  Cheral MarkerKimberly R. Mari Battaglia, CNM, [facility]NP[facility]BC 08/23/2015 5:09 PM

## 2015-08-24 ENCOUNTER — Telehealth: Payer: Self-pay | Admitting: Women's Health

## 2015-08-24 DIAGNOSIS — O165 Unspecified maternal hypertension, complicating the puerperium: Secondary | ICD-10-CM

## 2015-08-24 NOTE — Telephone Encounter (Signed)
Notified pt creatinine still elevated, have discussed w/ JVF and will refer to nephrologist Dr. Kristian CoveyBefekadu. If she doesn't hear from his office w/in the next week to schedule appt call us to let us know.  Cheral MarkerKimberly R. Betty Brooks, CNM, [facility]WHNP-BC 08/24/2015 9:06 AM

## 2015-08-29 ENCOUNTER — Telehealth: Payer: Self-pay | Admitting: Women's Health

## 2015-08-30 ENCOUNTER — Telehealth: Payer: Self-pay | Admitting: Women's Health

## 2015-08-30 NOTE — Telephone Encounter (Signed)
Pt informed have tried to contact Dr. Susa GriffinsBefekadu's office several times today with no success, message would left on their voicemail, will contact pt as soon as I can get an appt scheduled. Pt verbalized understanding.

## 2015-09-01 ENCOUNTER — Encounter: Payer: Self-pay | Admitting: Obstetrics and Gynecology

## 2015-09-01 ENCOUNTER — Ambulatory Visit (INDEPENDENT_AMBULATORY_CARE_PROVIDER_SITE_OTHER): Payer: Medicaid Other | Admitting: Obstetrics and Gynecology

## 2015-09-01 VITALS — BP 112/74 | Ht 62.0 in | Wt 164.0 lb

## 2015-09-01 DIAGNOSIS — R05 Cough: Secondary | ICD-10-CM | POA: Diagnosis not present

## 2015-09-01 DIAGNOSIS — O165 Unspecified maternal hypertension, complicating the puerperium: Secondary | ICD-10-CM

## 2015-09-01 DIAGNOSIS — I10 Essential (primary) hypertension: Secondary | ICD-10-CM

## 2015-09-01 DIAGNOSIS — R1031 Right lower quadrant pain: Secondary | ICD-10-CM

## 2015-09-01 MED ORDER — CYCLOBENZAPRINE HCL 10 MG PO TABS: 10.0000 mg | ORAL_TABLET | Freq: Three times a day (TID) | ORAL | Status: DC | PRN

## 2015-09-01 NOTE — Progress Notes (Signed)
Patient ID: Natasha NestleChristy A Montavon, female   DOB: 09/05/1985, 30 y.o.   MRN: [id number]    [facility]  @DATE @            Patient name: Natasha Bean MRN [id number]  Date of birth: 08/16/1985  CC & HPI:  Natasha Bean is a 30 y.o. female presenting today for followup from persistent BP elevation, on 3 meds after delivery now 3 months PP. Pt is currently taking benazepril and HCTZ. Pt denies lightheadedness, dizziness, HA. She does complain of a dry cough for a week, which she attributes to seasonal allergies. Pt also complains of aching, right side pain, which began during pregnancy and has persisted since vaginal delivery. She reports h/o kidney disease and states she has f/u with her nephrologist next month for further evaluation. Pt denies fever.   ROS:  Review of Systems  Constitutional: Negative for fever.  Respiratory: Positive for cough.   Genitourinary: Positive for flank pain.  Neurological: Negative for dizziness.  All other systems reviewed and are negative.    Pertinent History Reviewed:   Reviewed Medical         Past Medical History  Diagnosis Date  . Asthma   . Anemia   . Migraine   . Vaginal discharge during pregnancy in second trimester 01/06/2015  . Trichimoniasis 01/06/2015    Repeat neg- 01/17/15  . Round ligament pain 01/06/2015  . Hematuria 01/06/2015  . Urinary frequency 01/06/2015  . Pregnancy induced hypertension   . History of marijuana use 11/01/2014    Repeat UDS- Neg (04/19/2015)  . Contraceptive management 06/10/2015                              Surgical Hx:    Past Surgical History  Procedure Laterality Date  . No past surgeries     Medications: Reviewed & Updated - see associated section                       Current outpatient prescriptions:  .  benazepril (LOTENSIN) 5 MG tablet, Take 1 tablet (5 mg total) by mouth daily., Disp: 30 tablet, Rfl: 6 .  ferrous sulfate 325 (65 FE) MG tablet, Take 1 tablet (325 mg total) by mouth 2  (two) times daily with a meal. (Patient taking differently: Take 325 mg by mouth 3 (three) times daily with meals. ), Disp: 60 tablet, Rfl: 3 .  Multiple Vitamin (MULTIVITAMIN) tablet, Take 1 tablet by mouth daily., Disp: , Rfl:  .  medroxyPROGESTERone (DEPO-PROVERA) 150 MG/ML injection, Inject 1 mL (150 mg total) into the muscle every 3 (three) months. (Patient not taking: Reported on 09/01/2015), Disp: 1 mL, Rfl: 4   Social History: Reviewed -  reports that she has been smoking Cigarettes.  She has a 2.5 pack-year smoking history. She has never used smokeless tobacco.  Objective Findings:  Vitals: Blood pressure 112/74, height 5\' 2"  (1.575 m), weight 164 lb (74.39 kg), last menstrual period 07/26/2015, not currently breastfeeding.  Physical Examination: Discussion only   Discussed with pt BP medication. At end of discussion, pt had opportunity to ask questions and has no further questions at this time.   Greater than 50% was spent in counseling and coordination of care with the patient. Total time greater than: 15 minutes   Assessment & Plan:   A:  1. Dry cough for one week  2. Right side  pain, pt has f/u with nephrologist for kidney disease  3. Good BP control on benazepril and HCTZ   P:  1. Refill benazepril and HCTZ 6 months 2. F/u if cough persists for more than 2 weeks or in 6 months for BP recheck  3. Rx 30 10mg  Flexeril with 1 refill  Return 6 months    By signing my name below, I, Doreatha MartinEva Mathews, attest that this documentation has been prepared under the direction and in the presence of Tilda BurrowJohn V Anie Juniel, MD. Electronically Signed: Doreatha MartinEva Mathews, ED Scribe. 09/01/2015. 2:17 PM.  I personally performed the services described in this documentation, which was SCRIBED in my presence. The recorded information has been reviewed and considered accurate. It has been edited as necessary during review. Tilda BurrowFERGUSON,Jacobi Ryant V, MD

## 2015-09-05 ENCOUNTER — Telehealth: Payer: Self-pay | Admitting: Obstetrics and Gynecology

## 2015-09-07 ENCOUNTER — Telehealth: Payer: Self-pay | Admitting: *Deleted

## 2015-09-26 ENCOUNTER — Other Ambulatory Visit (HOSPITAL_COMMUNITY): Payer: Self-pay | Admitting: Nephrology

## 2015-09-26 DIAGNOSIS — N183 Chronic kidney disease, stage 3 unspecified: Secondary | ICD-10-CM

## 2015-10-12 ENCOUNTER — Ambulatory Visit (HOSPITAL_COMMUNITY)
Admission: RE | Admit: 2015-10-12 | Discharge: 2015-10-12 | Disposition: A | Discharge status: Home / Self Care | Payer: Medicaid Other | Source: Ambulatory Visit | Attending: Nephrology | Admitting: Nephrology

## 2015-10-12 DIAGNOSIS — N183 Chronic kidney disease, stage 3 unspecified: Secondary | ICD-10-CM

## 2015-11-17 ENCOUNTER — Emergency Department (HOSPITAL_COMMUNITY)
Admission: EM | Admit: 2015-11-17 | Discharge: 2015-11-17 | Disposition: A | Discharge status: Home / Self Care | Payer: Medicaid Other | Attending: Emergency Medicine | Admitting: Emergency Medicine

## 2015-11-17 ENCOUNTER — Encounter (HOSPITAL_COMMUNITY): Payer: Self-pay | Admitting: Emergency Medicine

## 2015-11-17 ENCOUNTER — Emergency Department (HOSPITAL_COMMUNITY): Payer: Medicaid Other

## 2015-11-17 DIAGNOSIS — E876 Hypokalemia: Secondary | ICD-10-CM | POA: Diagnosis not present

## 2015-11-17 DIAGNOSIS — J45909 Unspecified asthma, uncomplicated: Secondary | ICD-10-CM | POA: Insufficient documentation

## 2015-11-17 DIAGNOSIS — D649 Anemia, unspecified: Secondary | ICD-10-CM | POA: Diagnosis not present

## 2015-11-17 DIAGNOSIS — I10 Essential (primary) hypertension: Secondary | ICD-10-CM | POA: Insufficient documentation

## 2015-11-17 DIAGNOSIS — R0789 Other chest pain: Secondary | ICD-10-CM

## 2015-11-17 DIAGNOSIS — F17210 Nicotine dependence, cigarettes, uncomplicated: Secondary | ICD-10-CM | POA: Insufficient documentation

## 2015-11-17 DIAGNOSIS — Z79899 Other long term (current) drug therapy: Secondary | ICD-10-CM | POA: Diagnosis not present

## 2015-11-17 DIAGNOSIS — R072 Precordial pain: Secondary | ICD-10-CM | POA: Diagnosis present

## 2015-11-17 LAB — CBC WITH DIFFERENTIAL/PLATELET
Basophils Absolute: 0 10*3/uL (ref 0.0–0.1)
Basophils Relative: 0 %
Eosinophils Absolute: 0.2 10*3/uL (ref 0.0–0.7)
Eosinophils Relative: 3 %
HCT: 34 % — ABNORMAL LOW (ref 36.0–46.0)
Hemoglobin: 11.3 g/dL — ABNORMAL LOW (ref 12.0–15.0)
LYMPHS ABS: 2.9 10*3/uL (ref 0.7–4.0)
LYMPHS PCT: 49 %
MCH: 29.4 pg (ref 26.0–34.0)
MCHC: 33.2 g/dL (ref 30.0–36.0)
MCV: 88.5 fL (ref 78.0–100.0)
MONO ABS: 0.4 10*3/uL (ref 0.1–1.0)
Monocytes Relative: 7 %
Neutro Abs: 2.4 10*3/uL (ref 1.7–7.7)
Neutrophils Relative %: 41 %
PLATELETS: 301 10*3/uL (ref 150–400)
RBC: 3.84 MIL/uL — AB (ref 3.87–5.11)
RDW: 13.6 % (ref 11.5–15.5)
WBC: 6 10*3/uL (ref 4.0–10.5)

## 2015-11-17 LAB — TROPONIN I: Troponin I: <0.03 ng/mL (ref <0.03)

## 2015-11-17 LAB — BASIC METABOLIC PANEL
Anion gap: 9 (ref 5–15)
BUN: 18 mg/dL (ref 6–20)
CHLORIDE: 108 mmol/L (ref 101–111)
CO2: 22 mmol/L (ref 22–32)
Calcium: 9.2 mg/dL (ref 8.9–10.3)
Creatinine, Ser: 1.04 mg/dL — ABNORMAL HIGH (ref 0.44–1.00)
GFR calc Af Amer: >60 mL/min (ref >60)
GFR calc non Af Amer: >60 mL/min (ref >60)
GLUCOSE: 99 mg/dL (ref 65–99)
POTASSIUM: 3.3 mmol/L — AB (ref 3.5–5.1)
Sodium: 139 mmol/L (ref 135–145)

## 2015-11-17 MED ORDER — HYDROCODONE-ACETAMINOPHEN 5-325 MG PO TABS
2.0000 | ORAL_TABLET | Freq: Once | ORAL | Status: AC
  Administered 2015-11-17: 2 via ORAL
  Filled 2015-11-17: qty 2

## 2015-11-17 MED ORDER — POTASSIUM CHLORIDE CRYS ER 20 MEQ PO TBCR
40.0000 meq | EXTENDED_RELEASE_TABLET | Freq: Once | ORAL | Status: AC
  Administered 2015-11-17: 40 meq via ORAL
  Filled 2015-11-17: qty 2

## 2015-11-17 NOTE — ED Provider Notes (Signed)
[facility]-EMERGENCY DEPT Provider Note   CSN: [id number] Arrival date & time: 11/17/15  1209     History   Chief Complaint Chief Complaint  Patient presents with  . Chest Pain    HPI Natasha Bean is a 30 y.o. female.  HPI Complains of anterior chest pain substernal nonradiating pleuritic gradual onset 9 AM todayassociated shortness of breath nausea or sweatiness. Nothing makes pain better or worse. Pain is constant. Nonexertional. Also complains of diffuse headache for one week, gradual onset. Last headache 2 months ago. No fever no visual changes no other associated symptoms. She treated herself with Tylenol, without relief. Past Medical History:  Diagnosis Date  . Anemia   . Asthma   . Contraceptive management 06/10/2015  . Hematuria 01/06/2015  . History of marijuana use 11/01/2014   Repeat UDS- Neg (04/19/2015)  . Migraine   . Pregnancy induced hypertension   . Round ligament pain 01/06/2015  . Trichimoniasis 01/06/2015   Repeat neg- 01/17/15  . Urinary frequency 01/06/2015  . Vaginal discharge during pregnancy in second trimester 01/06/2015    Patient Active Problem List   Diagnosis Date Noted  . Hypertension, postpartum condition or complication 09/01/2015  . Severe preeclampsia in postpartum period 05/24/2015  . Marijuana use 02/14/2015  . Anemia, postpartum 02/14/2015    Past Surgical History:  Procedure Laterality Date  . NO PAST SURGERIES      OB History    Gravida Para Term Preterm AB Living   1 1 1     1    SAB TAB Ectopic Multiple Live Births         0 1       Home Medications    Prior to Admission medications   Medication Sig Start Date End Date Taking? Authorizing Provider  benazepril (LOTENSIN) 5 MG tablet Take 1 tablet (5 mg total) by mouth daily. 08/18/15   Tilda BurrowJohn V Ferguson, MD  cyclobenzaprine (FLEXERIL) 10 MG tablet Take 1 tablet (10 mg total) by mouth every 8 (eight) hours as needed for muscle spasms. 09/01/15   Tilda BurrowJohn V Ferguson, MD  ferrous  sulfate 325 (65 FE) MG tablet Take 1 tablet (325 mg total) by mouth 2 (two) times daily with a meal. Patient taking differently: Take 325 mg by mouth 3 (three) times daily with meals.  02/14/15   Cheral MarkerKimberly R Booker, CNM  medroxyPROGESTERone (DEPO-PROVERA) 150 MG/ML injection Inject 1 mL (150 mg total) into the muscle every 3 (three) months. Patient not taking: Reported on 09/01/2015 08/18/15   Tilda BurrowJohn V Ferguson, MD  Multiple Vitamin (MULTIVITAMIN) tablet Take 1 tablet by mouth daily.    Historical Provider, MD    Family History Family History  Problem Relation Age of Onset  . Cancer Mother     throat  . Hypertension Father   . Diabetes Father   . Cancer Father     colon  . Heart disease Father   . Thyroid disease Father   . Stroke Father   . Heart disease Paternal Grandmother   . Hypertension Paternal Grandmother   . Diabetes Paternal Grandmother   . Seizures Daughter     Social History Social History  Substance Use Topics  . Smoking status: Current Every Day Smoker    Packs/day: 0.25    Years: 10.00    Types: Cigarettes  . Smokeless tobacco: Never Used  . Alcohol use No     Comment: occ   Denies illicit drug use  Allergies   Benadryl [diphenhydramine hcl]  and Fioricet [butalbital-apap-caffeine]   Review of Systems Review of Systems  Constitutional: Negative.   Respiratory: Negative.   Cardiovascular: Positive for chest pain.  Gastrointestinal: Negative.   Genitourinary:       6 month postpartum. Not breast-feeding  Musculoskeletal: Negative.   Skin: Negative.   Neurological: Positive for headaches.  Psychiatric/Behavioral: Negative.   All other systems reviewed and are negative.    Physical Exam Updated Vital Signs BP 110/84 (BP Location: Left Arm)   Pulse 74   Temp 98.4 F (36.9 C) (Oral)   Resp 18   Ht 5\' 2"  (1.575 m)   Wt 166 lb (75.3 kg)   LMP 11/03/2015   SpO2 100%   BMI 30.36 kg/m   Physical Exam  Constitutional: She is oriented to person,  place, and time. She appears well-developed and well-nourished. No distress.  HENT:  Head: Normocephalic and atraumatic.  Eyes: Conjunctivae are normal. Pupils are equal, round, and reactive to light.  Optic Discs sharp and fundi benign  Neck: Neck supple. No tracheal deviation present. No thyromegaly present.  Cardiovascular: Normal rate and regular rhythm.  Exam reveals no friction rub.   No murmur heard. Pulmonary/Chest: Effort normal and breath sounds normal.  Abdominal: Soft. Bowel sounds are normal. She exhibits no distension. There is no tenderness.  Musculoskeletal: Normal range of motion. She exhibits no edema or tenderness.  Neurological: She is alert and oriented to person, place, and time. Coordination normal.  Gait normal Romberg normal pronator drift normal DTR symmetric bilaterally at knee jerk ankle jerk and biceps was ordered bilaterally  Skin: Skin is warm and dry. No rash noted.  Psychiatric: She has a normal mood and affect.  Nursing note and vitals reviewed.    ED Treatments / Results  Labs (all labs ordered are listed, but only abnormal results are displayed) Labs Reviewed  BASIC METABOLIC PANEL  CBC WITH DIFFERENTIAL/PLATELET  TROPONIN I   Results for orders placed or performed during the hospital encounter of 11/17/15  Basic metabolic panel  Result Value Ref Range   Sodium 139 135 - 145 mmol/L   Potassium 3.3 (L) 3.5 - 5.1 mmol/L   Chloride 108 101 - 111 mmol/L   CO2 22 22 - 32 mmol/L   Glucose, Bld 99 65 - 99 mg/dL   BUN 18 6 - 20 mg/dL   Creatinine, Ser 1.61 (H) 0.44 - 1.00 mg/dL   Calcium 9.2 8.9 - 09.6 mg/dL   GFR calc non Af Amer >60 >60 mL/min   GFR calc Af Amer >60 >60 mL/min   Anion gap 9 5 - 15  CBC with Differential/Platelet  Result Value Ref Range   WBC 6.0 4.0 - 10.5 K/uL   RBC 3.84 (L) 3.87 - 5.11 MIL/uL   Hemoglobin 11.3 (L) 12.0 - 15.0 g/dL   HCT 04.5 (L) 40.9 - 81.1 %   MCV 88.5 78.0 - 100.0 fL   MCH 29.4 26.0 - 34.0 pg   MCHC  33.2 30.0 - 36.0 g/dL   RDW 91.4 78.2 - 95.6 %   Platelets 301 150 - 400 K/uL   Neutrophils Relative % 41 %   Neutro Abs 2.4 1.7 - 7.7 K/uL   Lymphocytes Relative 49 %   Lymphs Abs 2.9 0.7 - 4.0 K/uL   Monocytes Relative 7 %   Monocytes Absolute 0.4 0.1 - 1.0 K/uL   Eosinophils Relative 3 %   Eosinophils Absolute 0.2 0.0 - 0.7 K/uL   Basophils Relative 0 %  Basophils Absolute 0.0 0.0 - 0.1 K/uL  Troponin I  Result Value Ref Range   Troponin I <0.03 <0.03 ng/mL   Dg Chest 2 View  Result Date: 11/17/2015 CLINICAL DATA:  Centered chest pain since 9 this morning. Childhood asthma. Smoker EXAM: CHEST  2 VIEW COMPARISON:  June 08, 2010 FINDINGS: The heart size and mediastinal contours are within normal limits. Both lungs are clear. The visualized skeletal structures are unremarkable. IMPRESSION: No active cardiopulmonary disease. Electronically Signed   By: Jasmine Pang M.D.   On: 11/17/2015 13:06    EKG  EKG Interpretation  Date/Time:  Thursday November 17 2015 12:18:40 EDT Ventricular Rate:  66 PR Interval:    QRS Duration: 88 QT Interval:  410 QTC Calculation: 430 R Axis:   36 Text Interpretation:  Sinus rhythm Borderline repolarization abnormality No significant change since last tracing Confirmed by Ethelda Chick  MD, Bjorn Hallas [phone]) on 11/17/2015 12:32:08 PM       Radiology No results found. Chest x-ray viewed by me Procedures Procedures (including critical care time)  Medications Ordered in ED Medications  HYDROcodone-acetaminophen (NORCO/VICODIN) 5-325 MG per tablet 2 tablet (not administered)     Initial Impression / Assessment and Plan / ED Course  I have reviewed the triage vital signs and the nursing notes.  Pertinent labs & imaging results that were available during my care of the patient were reviewed by me and considered in my medical decision making (see chart for details).  Clinical Course  Comment By Time  Headache improved after treatment with 2 Norco  tablets. Feels a slight "pulling sensation in her anterior chest. Doug Sou, MD 09/07 1354    Chest pain thought to be highly atypical for acute coronary syndrome. Heart score equals 2. Headache also felt to be nonspecific. She'll receive potassium chloride y mouth prior to discharge. she'll be referred to local health department to get a PMD Councilled for 5 minutes on smoking cessation. Anemia much improved over 5 months ago   Final Clinical Impressions(s) / ED Diagnoses  Diagnoses #1 atypical chest pain #2 nonspecific headache #3 anemia #4 hypokalemia #5 tobacco abuse Final diagnoses:  None    New Prescriptions New Prescriptions   No medications on file     Doug Sou, MD 11/17/15 1358

## 2015-11-17 NOTE — Discharge Instructions (Signed)
It is safe to take Tylenol as directed for pain. Call your local health department to arrange get a primary care physician. Ask your new primary care physician to help you to stop smoking Blood count today showed mild anemia with hemoglobin 11.3, much improved over 5 months ago. Blood potassium was slightly low at 3.3. You're given potassium here prior to leaving. Chest xray was normal

## 2015-11-17 NOTE — ED Triage Notes (Signed)
PT c/o central sharp chest pain worse with inhalation that started today. PT also c/o headache x1 week. PT states hx of HTN and is on amlodipine.

## 2016-03-02 ENCOUNTER — Ambulatory Visit (INDEPENDENT_AMBULATORY_CARE_PROVIDER_SITE_OTHER): Payer: Medicaid Other | Admitting: Obstetrics and Gynecology

## 2016-03-02 ENCOUNTER — Encounter: Payer: Self-pay | Admitting: Obstetrics and Gynecology

## 2016-03-02 VITALS — BP 118/72 | HR 66 | Ht 62.0 in | Wt 158.0 lb

## 2016-03-02 DIAGNOSIS — O165 Unspecified maternal hypertension, complicating the puerperium: Secondary | ICD-10-CM

## 2016-03-02 DIAGNOSIS — R42 Dizziness and giddiness: Secondary | ICD-10-CM

## 2016-03-02 NOTE — Progress Notes (Signed)
   [facility] Visit  03/02/2016           Patient name: Natasha Bean MRN [id number]  Date of birth: 03/27/1985  CC & HPI:  Natasha Bean is a 30 y.o. female 9 months postpartum,  presenting today for intermittent dizziness x a few months. She describes occasionally seeing stars. No alleviating factors noted for her dizziness. She has a h/o HTN and is complaint with her medications. Pt has no other acute complaints at this time.   ROS:  ROS Otherwise negative for acute change except as noted in the HPI.  Pertinent History Reviewed:   Reviewed: Significant for HTN and Anemia Medical         Past Medical History:  Diagnosis Date  . Anemia   . Asthma   . Contraceptive management 06/10/2015  . Hematuria 01/06/2015  . History of marijuana use 11/01/2014   Repeat UDS- Neg (04/19/2015)  . Migraine   . Pregnancy induced hypertension   . Round ligament pain 01/06/2015  . Trichimoniasis 01/06/2015   Repeat neg- 01/17/15  . Urinary frequency 01/06/2015  . Vaginal discharge during pregnancy in second trimester 01/06/2015                              Surgical Hx:    Past Surgical History:  Procedure Laterality Date  . NO PAST SURGERIES     Medications: Reviewed & Updated - see associated section                       Current Outpatient Prescriptions:  .  amLODipine (NORVASC) 5 MG tablet, Take 5 mg by mouth daily., Disp: , Rfl:  .  ferrous sulfate 325 (65 FE) MG tablet, Take 1 tablet (325 mg total) by mouth 2 (two) times daily with a meal. (Patient taking differently: Take 325 mg by mouth daily. ), Disp: 60 tablet, Rfl: 3   Social History: Reviewed -  reports that she has been smoking Cigarettes.  She has a 3.25 pack-year smoking history. She has never used smokeless tobacco.  Objective Findings:  Vitals: Blood pressure 118/72, pulse 66, height 5\' 2"  (1.575 m), weight 158 lb (71.7 kg), last menstrual period 02/21/2016, not currently breastfeeding.  Physical Examination:  General appearance - alert, well appearing, and in no distress Mental status - alert, oriented to person, place, and time Heart - Regular rhythm at  66 bpm    Assessment & Plan:   A:  1. Dizziness possible orthostatic HTN   P:  1. Will stop Norvasc and continue home BP monitoring qd  2. Return in 3 weeks.     By signing my name below, I, Freida BusmanDiana Omoyeni, attest that this documentation has been prepared under the direction and in the presence of Tilda BurrowJohn V Ashkan Chamberland, MD . Electronically Signed: Freida Busmaniana Omoyeni, Scribe. 03/02/2016. 10:16 AM. I personally performed the services described in this documentation, which was SCRIBED in my presence. The recorded information has been reviewed and considered accurate. It has been edited as necessary during review. Tilda BurrowFERGUSON,Uno Esau V, MD

## 2016-03-08 DIAGNOSIS — R42 Dizziness and giddiness: Secondary | ICD-10-CM | POA: Insufficient documentation

## 2016-03-23 ENCOUNTER — Ambulatory Visit (INDEPENDENT_AMBULATORY_CARE_PROVIDER_SITE_OTHER): Payer: Medicaid Other | Admitting: Obstetrics and Gynecology

## 2016-03-23 ENCOUNTER — Encounter: Payer: Self-pay | Admitting: Obstetrics and Gynecology

## 2016-03-23 DIAGNOSIS — Z0130 Encounter for examination of blood pressure without abnormal findings: Secondary | ICD-10-CM | POA: Diagnosis not present

## 2016-03-23 DIAGNOSIS — R42 Dizziness and giddiness: Secondary | ICD-10-CM | POA: Diagnosis not present

## 2016-03-23 DIAGNOSIS — O165 Unspecified maternal hypertension, complicating the puerperium: Secondary | ICD-10-CM

## 2016-03-23 NOTE — Progress Notes (Signed)
Patient ID: Natasha Bean, female   DOB: 04/17/1985, 31 y.o.   MRN: [id number]    [facility]  [facility]            Patient name: Natasha Bean MRN [id number]  Date of birth: 12/09/1985  CC & HPI:   Chief Complaint  Patient presents with  . Follow-up    BP check     Natasha Bean is a 31 y.o. female presenting today for BP recheck. Pts Norvasc was d/c on 03/02/16 due to dizziness, and she had been taking the medication since 05/19/15. She states since the medication was d/c her symptoms have improved.   ROS:  ROS No complaints, dizziness resolved.   Pertinent History Reviewed:   Reviewed: Significant for HTN and anemia Medical         Past Medical History:  Diagnosis Date  . Anemia   . Asthma   . Contraceptive management 06/10/2015  . Hematuria 01/06/2015  . History of marijuana use 11/01/2014   Repeat UDS- Neg (04/19/2015)  . Migraine   . Pregnancy induced hypertension   . Round ligament pain 01/06/2015  . Trichimoniasis 01/06/2015   Repeat neg- 01/17/15  . Urinary frequency 01/06/2015  . Vaginal discharge during pregnancy in second trimester 01/06/2015                              Surgical Hx:    Past Surgical History:  Procedure Laterality Date  . NO PAST SURGERIES     Medications: Reviewed & Updated - see associated section                       Current Outpatient Prescriptions:  .  ferrous sulfate 325 (65 FE) MG tablet, Take 1 tablet (325 mg total) by mouth 2 (two) times daily with a meal. (Patient taking differently: Take 325 mg by mouth daily. ), Disp: 60 tablet, Rfl: 3   Social History: Reviewed -  reports that she has been smoking Cigarettes.  She has a 3.25 pack-year smoking history. She has never used smokeless tobacco.  Objective Findings:  Vitals: Last menstrual period 02/21/2016, not currently breastfeeding.  Physical Examination: Physical Examination: General appearance - alert, well appearing, and in no distress, oriented to  person, place, and time and normal appearing weight Mental status - alert, oriented to person, place, and time, normal mood, behavior, speech, dress, motor activity, and thought processes, affect appropriate to mood Eyes - pupils equal and reactive, extraocular eye movements intact Heart - normal rate and regular rhythm   Assessment & Plan:   A:  1. HTN resolved   P:  1. D/c BP medications 2. F/u PRN Pt to monitor bp weekly.    By signing my name below, I, Doreatha MartinEva Mathews, attest that this documentation has been prepared under the direction and in the presence of Tilda BurrowJohn V Burtis Imhoff, MD. Electronically Signed: Doreatha MartinEva Mathews, ED Scribe. 03/23/16. 11:07 AM.  I personally performed the services described in this documentation, which was SCRIBED in my presence. The recorded information has been reviewed and considered accurate. It has been edited as necessary during review. Tilda BurrowFERGUSON,Jakai Onofre V, MD

## 2016-06-01 ENCOUNTER — Emergency Department (HOSPITAL_COMMUNITY): Payer: Medicaid Other

## 2016-06-01 ENCOUNTER — Emergency Department (HOSPITAL_COMMUNITY)
Admission: EM | Admit: 2016-06-01 | Discharge: 2016-06-01 | Disposition: A | Discharge status: Home / Self Care | Payer: Medicaid Other | Attending: Emergency Medicine | Admitting: Emergency Medicine

## 2016-06-01 ENCOUNTER — Encounter (HOSPITAL_COMMUNITY): Payer: Self-pay

## 2016-06-01 DIAGNOSIS — Z79899 Other long term (current) drug therapy: Secondary | ICD-10-CM | POA: Diagnosis not present

## 2016-06-01 DIAGNOSIS — M791 Myalgia: Secondary | ICD-10-CM | POA: Insufficient documentation

## 2016-06-01 DIAGNOSIS — R51 Headache: Secondary | ICD-10-CM | POA: Diagnosis present

## 2016-06-01 DIAGNOSIS — Z791 Long term (current) use of non-steroidal anti-inflammatories (NSAID): Secondary | ICD-10-CM | POA: Diagnosis not present

## 2016-06-01 DIAGNOSIS — R202 Paresthesia of skin: Secondary | ICD-10-CM | POA: Insufficient documentation

## 2016-06-01 DIAGNOSIS — F17210 Nicotine dependence, cigarettes, uncomplicated: Secondary | ICD-10-CM | POA: Diagnosis not present

## 2016-06-01 DIAGNOSIS — J45909 Unspecified asthma, uncomplicated: Secondary | ICD-10-CM | POA: Diagnosis not present

## 2016-06-01 DIAGNOSIS — R110 Nausea: Secondary | ICD-10-CM | POA: Insufficient documentation

## 2016-06-01 DIAGNOSIS — R519 Headache, unspecified: Secondary | ICD-10-CM

## 2016-06-01 MED ORDER — METHYLPREDNISOLONE SODIUM SUCC 125 MG IJ SOLR
125.0000 mg | Freq: Once | INTRAMUSCULAR | Status: AC
  Administered 2016-06-01: 125 mg via INTRAVENOUS
  Filled 2016-06-01: qty 2

## 2016-06-01 MED ORDER — KETOROLAC TROMETHAMINE 30 MG/ML IJ SOLN
15.0000 mg | Freq: Once | INTRAMUSCULAR | Status: AC
  Administered 2016-06-01: 15 mg via INTRAVENOUS
  Filled 2016-06-01: qty 1

## 2016-06-01 MED ORDER — METOCLOPRAMIDE HCL 5 MG/ML IJ SOLN
10.0000 mg | Freq: Once | INTRAMUSCULAR | Status: AC
  Administered 2016-06-01: 10 mg via INTRAVENOUS
  Filled 2016-06-01: qty 2

## 2016-06-01 MED ORDER — SODIUM CHLORIDE 0.9 % IV BOLUS (SEPSIS)
1000.0000 mL | Freq: Once | INTRAVENOUS | Status: AC
  Administered 2016-06-01: 1000 mL via INTRAVENOUS

## 2016-06-01 NOTE — ED Triage Notes (Signed)
Pt reports that her head started hurting yesterday and has taken tylenol/advil with no relief. Reports tingling in hands and cramps in lower extremities. Pain right side of head and is sharp

## 2016-06-01 NOTE — ED Provider Notes (Signed)
[facility]-EMERGENCY DEPT Provider Note   CSN: [id number] Arrival date & time: 06/01/16  4098  By signing my name below, I, Sonum Patel, attest that this documentation has been prepared under the direction and in the presence of Marily Memos, MD. Electronically Signed: Leone Payor, Scribe. 06/01/16. 9:39 AM.  History   Chief Complaint Chief Complaint  Patient presents with  . Headache    The history is provided by the patient. No language interpreter was used.    HPI Comments: Natasha Bean is a 31 y.o. female who presents to the Emergency Department complaining of gradual onset, constant, unchanged right sided HA that started yesterday while at work. She describes her pain as sharp in nature. She reports nausea, paresthesia to the hands and feet, and leg cramping that began today. She has tried Tylenol and ibuprofen without significant relief. She reports a history of migraines and states this feels similar but is lasting longer than usual. She has a history of HTN and states she has been compliant with her medications. She denies vomiting, fever, neck pain, back pain, extremity weakness or numbness, vision changes.   Past Medical History:  Diagnosis Date  . Anemia   . Asthma   . Contraceptive management 06/10/2015  . Hematuria 01/06/2015  . History of marijuana use 11/01/2014   Repeat UDS- Neg (04/19/2015)  . Migraine   . Pregnancy induced hypertension   . Round ligament pain 01/06/2015  . Trichimoniasis 01/06/2015   Repeat neg- 01/17/15  . Urinary frequency 01/06/2015  . Vaginal discharge during pregnancy in second trimester 01/06/2015    Patient Active Problem List   Diagnosis Date Noted  . Dizzy spells 03/08/2016  . Hypertension, postpartum condition or complication 09/01/2015  . Severe preeclampsia in postpartum period 05/24/2015  . Marijuana use 02/14/2015  . Anemia, postpartum 02/14/2015    Past Surgical History:  Procedure Laterality Date  . NO PAST SURGERIES       OB History    Gravida Para Term Preterm AB Living   1 1 1     1    SAB TAB Ectopic Multiple Live Births         0 1       Home Medications    Prior to Admission medications   Medication Sig Start Date End Date Taking? Authorizing Provider  acetaminophen (TYLENOL) 500 MG tablet Take 500 mg by mouth every 6 (six) hours as needed.   Yes Historical Provider, MD  ferrous sulfate 325 (65 FE) MG tablet Take 1 tablet (325 mg total) by mouth 2 (two) times daily with a meal. Patient taking differently: Take 325 mg by mouth daily.  02/14/15  Yes Cheral Marker, CNM  ibuprofen (ADVIL,MOTRIN) 200 MG tablet Take 600 mg by mouth every 6 (six) hours as needed.   Yes Historical Provider, MD    Family History Family History  Problem Relation Age of Onset  . Cancer Mother     throat  . Hypertension Father   . Diabetes Father   . Cancer Father     colon  . Heart disease Father   . Thyroid disease Father   . Stroke Father   . Kidney failure Father   . Heart disease Paternal Grandmother   . Hypertension Paternal Grandmother   . Diabetes Paternal Grandmother   . Seizures Daughter     Social History Social History  Substance Use Topics  . Smoking status: Current Every Day Smoker    Packs/day: 0.25  Years: 13.00    Types: Cigarettes  . Smokeless tobacco: Never Used  . Alcohol use No     Comment: occ     Allergies   Benadryl [diphenhydramine hcl] and Fioricet [butalbital-apap-caffeine]   Review of Systems Review of Systems  Constitutional: Negative for fever.  Eyes: Negative for visual disturbance.  Gastrointestinal: Positive for nausea. Negative for vomiting.  Musculoskeletal: Positive for myalgias. Negative for back pain and neck pain.  Neurological: Positive for headaches. Negative for weakness and numbness.       +paresthesia      Physical Exam Updated Vital Signs BP 109/66   Pulse (!) 44   Temp 98.3 F (36.8 C) (Oral)   Resp 16   Ht 5\' 2"  (1.575 m)   Wt  162 lb (73.5 kg)   LMP 05/26/2016   SpO2 99%   BMI 29.63 kg/m   Physical Exam  Constitutional: She is oriented to person, place, and time. She appears well-developed and well-nourished.  HENT:  Head: Normocephalic and atraumatic.  Eyes: Conjunctivae and EOM are normal. Pupils are equal, round, and reactive to light.  Neck: Normal range of motion. Neck supple. No tracheal deviation present.  Cardiovascular: Normal rate, regular rhythm, normal heart sounds and intact distal pulses.   Pulmonary/Chest: Effort normal and breath sounds normal. No respiratory distress. She has no wheezes. She has no rales.  Neurological: She is alert and oriented to person, place, and time. She displays normal reflexes. No cranial nerve deficit or sensory deficit. She exhibits normal muscle tone. Coordination normal.  CN intact. Finger to nose testing normal. Upper extremity strength normal. DTR in biceps normal. Strength, sensation, and reflexes of the patella normal in BLE.   Skin: Skin is warm and dry.  Psychiatric: She has a normal mood and affect.  Nursing note and vitals reviewed.    ED Treatments / Results  DIAGNOSTIC STUDIES: Oxygen Saturation is 100% on RA, normal by my interpretation.    COORDINATION OF CARE: 9:37 AM Discussed treatment plan with pt at bedside and pt agreed to plan.   Labs (all labs ordered are listed, but only abnormal results are displayed) Labs Reviewed - No data to display  EKG  EKG Interpretation None       Radiology Ct Head Wo Contrast  Result Date: 06/01/2016 CLINICAL DATA:  Headache for the past 2 days. Tingling in all 4 extremities. History of migraine headaches. EXAM: CT HEAD WITHOUT CONTRAST TECHNIQUE: Contiguous axial images were obtained from the base of the skull through the vertex without intravenous contrast. COMPARISON:  02/26/2010. FINDINGS: Brain: No evidence of acute infarction, hemorrhage, hydrocephalus, extra-axial collection or mass lesion/mass  effect. Vascular: No hyperdense vessel or unexpected calcification. Skull: Normal. Negative for fracture or focal lesion. Sinuses/Orbits: Unremarkable. Other: None. IMPRESSION: Normal examination, unchanged. Electronically Signed   By: Beckie SaltsSteven  Reid M.D.   On: 06/01/2016 11:08    Procedures Procedures (including critical care time)  Medications Ordered in ED Medications  metoCLOPramide (REGLAN) injection 10 mg (10 mg Intravenous Given 06/01/16 1007)  methylPREDNISolone sodium succinate (SOLU-MEDROL) 125 mg/2 mL injection 125 mg (125 mg Intravenous Given 06/01/16 1007)  sodium chloride 0.9 % bolus 1,000 mL (0 mLs Intravenous Stopped 06/01/16 1100)  ketorolac (TORADOL) 30 MG/ML injection 15 mg (15 mg Intravenous Given 06/01/16 1007)     Initial Impression / Assessment and Plan / ED Course  I have reviewed the triage vital signs and the nursing notes.  Pertinent labs & imaging results that were available  during my care of the patient were reviewed by me and considered in my medical decision making (see chart for details).     31 year old female here with a headache similar to previous but slightly worse and lasted longer. CT negative for any acute intracranial abnormalities. No fever nuchal rigidity or back pain suggest meningitis. Normal neuro exam, doubt stroke. Doubt subarachnoid hemorrhage as well. Headache improved significantly with medications here patient stable for discharge home to follow up with her primary doctor for further workup and management of her headaches.  Final Clinical Impressions(s) / ED Diagnoses   Final diagnoses:  Nonintractable headache, unspecified chronicity pattern, unspecified headache type   I personally performed the services described in this documentation, which was scribed in my presence. The recorded information has been reviewed and is accurate.     Marily Memos, MD 06/02/16 1048

## 2016-09-10 ENCOUNTER — Encounter (HOSPITAL_COMMUNITY): Payer: Self-pay | Admitting: Emergency Medicine

## 2016-09-10 ENCOUNTER — Emergency Department (HOSPITAL_COMMUNITY)
Admission: EM | Admit: 2016-09-10 | Discharge: 2016-09-10 | Disposition: A | Discharge status: Home / Self Care | Payer: Medicaid Other | Attending: Emergency Medicine | Admitting: Emergency Medicine

## 2016-09-10 DIAGNOSIS — N939 Abnormal uterine and vaginal bleeding, unspecified: Secondary | ICD-10-CM

## 2016-09-10 DIAGNOSIS — M5441 Lumbago with sciatica, right side: Secondary | ICD-10-CM | POA: Insufficient documentation

## 2016-09-10 DIAGNOSIS — F17210 Nicotine dependence, cigarettes, uncomplicated: Secondary | ICD-10-CM | POA: Insufficient documentation

## 2016-09-10 DIAGNOSIS — I10 Essential (primary) hypertension: Secondary | ICD-10-CM | POA: Insufficient documentation

## 2016-09-10 DIAGNOSIS — J45909 Unspecified asthma, uncomplicated: Secondary | ICD-10-CM | POA: Insufficient documentation

## 2016-09-10 DIAGNOSIS — N938 Other specified abnormal uterine and vaginal bleeding: Secondary | ICD-10-CM | POA: Insufficient documentation

## 2016-09-10 DIAGNOSIS — M5442 Lumbago with sciatica, left side: Secondary | ICD-10-CM | POA: Insufficient documentation

## 2016-09-10 DIAGNOSIS — M545 Low back pain, unspecified: Secondary | ICD-10-CM

## 2016-09-10 LAB — URINALYSIS, ROUTINE W REFLEX MICROSCOPIC
Bacteria, UA: NONE SEEN
Bilirubin Urine: NEGATIVE
GLUCOSE, UA: NEGATIVE mg/dL
Ketones, ur: NEGATIVE mg/dL
Leukocytes, UA: NEGATIVE
Nitrite: NEGATIVE
PH: 5 (ref 5.0–8.0)
Protein, ur: NEGATIVE mg/dL
SPECIFIC GRAVITY, URINE: 1.021 (ref 1.005–1.030)

## 2016-09-10 LAB — BASIC METABOLIC PANEL
ANION GAP: 6 (ref 5–15)
BUN: 14 mg/dL (ref 6–20)
CHLORIDE: 108 mmol/L (ref 101–111)
CO2: 23 mmol/L (ref 22–32)
Calcium: 8.6 mg/dL — ABNORMAL LOW (ref 8.9–10.3)
Creatinine, Ser: 1.02 mg/dL — ABNORMAL HIGH (ref 0.44–1.00)
GFR calc Af Amer: >60 mL/min (ref >60)
GFR calc non Af Amer: >60 mL/min (ref >60)
GLUCOSE: 95 mg/dL (ref 65–99)
POTASSIUM: 3.8 mmol/L (ref 3.5–5.1)
Sodium: 137 mmol/L (ref 135–145)

## 2016-09-10 LAB — CBC
HEMATOCRIT: 34.1 % — AB (ref 36.0–46.0)
Hemoglobin: 11.2 g/dL — ABNORMAL LOW (ref 12.0–15.0)
MCH: 29.5 pg (ref 26.0–34.0)
MCHC: 32.8 g/dL (ref 30.0–36.0)
MCV: 89.7 fL (ref 78.0–100.0)
Platelets: 314 10*3/uL (ref 150–400)
RBC: 3.8 MIL/uL — AB (ref 3.87–5.11)
RDW: 13.9 % (ref 11.5–15.5)
WBC: 5.3 10*3/uL (ref 4.0–10.5)

## 2016-09-10 LAB — PREGNANCY, URINE: Preg Test, Ur: NEGATIVE

## 2016-09-10 MED ORDER — IBUPROFEN 800 MG PO TABS
800.0000 mg | ORAL_TABLET | Freq: Once | ORAL | Status: AC
  Administered 2016-09-10: 800 mg via ORAL
  Filled 2016-09-10: qty 1

## 2016-09-10 MED ORDER — IBUPROFEN 800 MG PO TABS: 800.0000 mg | ORAL_TABLET | Freq: Three times a day (TID) | ORAL | 0 refills | Status: DC

## 2016-09-10 NOTE — ED Triage Notes (Signed)
Patient complaining of lower back pain radiating down bilateral legs x 3-4 days. States "I started having vaginal bleeding with large blood clots this morning but it's not time for my period."

## 2016-09-10 NOTE — ED Provider Notes (Signed)
[facility] Provider Note   CSN: [id number] Arrival date & time: 09/10/16  1207     History   Chief Complaint Chief Complaint  Patient presents with  . Back Pain    HPI Natasha Bean is a 31 y.o. female.  HPI  C/o Vaginal bleeding, onset this AM Noticed it to be dark and clot looking She finished her menses a couple of days ago  Menses lasted 7 this time (usually 3 days). Has a lower back pain since menses.  Is down both legs from the back.  R > L.  The abdomen has been cramping.  But no n/v and normal appetite.  She has no hx of chronic back pain.  Past Medical History:  Diagnosis Date  . Anemia   . Asthma   . Contraceptive management 06/10/2015  . Hematuria 01/06/2015  . History of marijuana use 11/01/2014   Repeat UDS- Neg (04/19/2015)  . Migraine   . Pregnancy induced hypertension   . Round ligament pain 01/06/2015  . Trichimoniasis 01/06/2015   Repeat neg- 01/17/15  . Urinary frequency 01/06/2015  . Vaginal discharge during pregnancy in second trimester 01/06/2015    Patient Active Problem List   Diagnosis Date Noted  . Dizzy spells 03/08/2016  . Hypertension, postpartum condition or complication 09/01/2015  . Severe preeclampsia in postpartum period 05/24/2015  . Marijuana use 02/14/2015  . Anemia, postpartum 02/14/2015    Past Surgical History:  Procedure Laterality Date  . NO PAST SURGERIES      OB History    Gravida Para Term Preterm AB Living   1 1 1     1    SAB TAB Ectopic Multiple Live Births         0 1       Home Medications    Prior to Admission medications   Medication Sig Start Date End Date Taking? Authorizing Provider  acetaminophen (TYLENOL) 500 MG tablet Take 500 mg by mouth every 6 (six) hours as needed.    [provider]  ferrous sulfate 325 (65 FE) MG tablet Take 1 tablet (325 mg total) by mouth 2 (two) times daily with a meal. Patient taking differently: Take 325 mg by mouth daily.  02/14/15   Cheral MarkerBooker,  Kimberly R, CNM  ibuprofen (ADVIL,MOTRIN) 200 MG tablet Take 600 mg by mouth every 6 (six) hours as needed.    [provider]    Family History Family History  Problem Relation Age of Onset  . Cancer Mother        throat  . Hypertension Father   . Diabetes Father   . Cancer Father        colon  . Heart disease Father   . Thyroid disease Father   . Stroke Father   . Kidney failure Father   . Heart disease Paternal Grandmother   . Hypertension Paternal Grandmother   . Diabetes Paternal Grandmother   . Seizures Daughter     Social History Social History  Substance Use Topics  . Smoking status: Current Every Day Smoker    Packs/day: 0.25    Years: 13.00    Types: Cigarettes  . Smokeless tobacco: Never Used  . Alcohol use No     Comment: occ     Allergies   Benadryl [diphenhydramine hcl] and Fioricet [butalbital-apap-caffeine]   Review of Systems Review of Systems  Constitutional: Negative for chills and fever.  Respiratory: Negative for cough and shortness of breath.   Cardiovascular: Negative  for chest pain and leg swelling.  Gastrointestinal: Positive for abdominal pain. Negative for blood in stool, diarrhea, nausea, rectal pain and vomiting.  Genitourinary: Positive for vaginal bleeding. Negative for difficulty urinating and dysuria.  Musculoskeletal: Positive for back pain. Negative for neck pain.  Skin: Negative for rash.     Physical Exam Updated Vital Signs BP 123/90 (BP Location: Right Arm)   Pulse 70   Temp 98.1 F (36.7 C) (Oral)   Resp 16   Ht 5\' 2"  (1.575 m)   Wt 72.6 kg (160 lb)   LMP 09/01/2016   SpO2 100%   BMI 29.26 kg/m   Physical Exam  Constitutional: She appears well-developed and well-nourished. No distress.  HENT:  Head: Normocephalic and atraumatic.  Mouth/Throat: Oropharynx is clear and moist. No oropharyngeal exudate.  Eyes: Conjunctivae and EOM are normal. Pupils are equal, round, and reactive to light. Right eye  exhibits no discharge. Left eye exhibits no discharge. No scleral icterus.  Neck: Normal range of motion. Neck supple. No JVD present. No thyromegaly present.  Cardiovascular: Normal rate, regular rhythm, normal heart sounds and intact distal pulses.  Exam reveals no gallop and no friction rub.   No murmur heard. Pulmonary/Chest: Effort normal and breath sounds normal. No respiratory distress. She has no wheezes. She has no rales.  Abdominal: Soft. Bowel sounds are normal. She exhibits no distension and no mass. There is tenderness ( minimal lower abd ttp).  Musculoskeletal: Normal range of motion. She exhibits no edema or tenderness ( lower back ttp over the RLB and LLB, no midline ttp).  Lymphadenopathy:    She has no cervical adenopathy.  Neurological: She is alert. Coordination normal.  Normal strength int he bil LE's and bil UE's.    Skin: Skin is warm and dry. No rash noted. No erythema.  Psychiatric: She has a normal mood and affect. Her behavior is normal.  Nursing note and vitals reviewed.    ED Treatments / Results  Labs (all labs ordered are listed, but only abnormal results are displayed) Labs Reviewed  URINALYSIS, ROUTINE W REFLEX MICROSCOPIC  PREGNANCY, URINE    Radiology No results found.  Procedures Procedures (including critical care time)  Medications Ordered in ED Medications - No data to display   Initial Impression / Assessment and Plan / ED Course  I have reviewed the triage vital signs and the nursing notes.  Pertinent labs & imaging results that were available during my care of the patient were reviewed by me and considered in my medical decision making (see chart for details).    Check CBC, BMP and UA / preg Pt otherwise well appaering, has no signs of decompensation, no neuro sx. Has no spinal ttp and is no pregnant.  Urinalysis without any signs of infection, she does have some mild hematuria. Labs show no significant anemia, no hypokalemia, the  patient is otherwise stable for discharge on an anti-inflammatory. Doubt kidney stone, doubt pyelonephritis, doubt pelvic mass or tumor. Will give follow-up with GYN. Patient agreeable  Final Clinical Impressions(s) / ED Diagnoses   Final diagnoses:  Acute bilateral low back pain without sciatica  Abnormal vaginal bleeding    New Prescriptions New Prescriptions   No medications on file     Eber Hong, MD 09/10/16 1456

## 2016-09-10 NOTE — Discharge Instructions (Signed)

## 2016-09-26 ENCOUNTER — Other Ambulatory Visit (HOSPITAL_COMMUNITY)
Admission: RE | Admit: 2016-09-26 | Discharge: 2016-09-26 | Disposition: A | Discharge status: Home / Self Care | Payer: Self-pay | Source: Ambulatory Visit | Attending: Obstetrics and Gynecology | Admitting: Obstetrics and Gynecology

## 2016-09-26 ENCOUNTER — Encounter: Payer: Self-pay | Admitting: Obstetrics and Gynecology

## 2016-09-26 ENCOUNTER — Ambulatory Visit (INDEPENDENT_AMBULATORY_CARE_PROVIDER_SITE_OTHER): Payer: Medicaid Other | Admitting: Obstetrics and Gynecology

## 2016-09-26 VITALS — BP 120/70 | HR 82 | Ht 62.5 in | Wt 161.2 lb

## 2016-09-26 DIAGNOSIS — Z3009 Encounter for other general counseling and advice on contraception: Secondary | ICD-10-CM | POA: Insufficient documentation

## 2016-09-26 MED ORDER — NORETHIN ACE-ETH ESTRAD-FE 1-20 MG-MCG(24) PO TABS: 1.0000 | ORAL_TABLET | Freq: Every day | ORAL | 11 refills | Status: DC

## 2016-09-26 NOTE — Progress Notes (Signed)
  Assessment:  Annual Gyn Exam   Plan:  1. pap smear done, next pap due 3 2. return annually or prn 3    Annual mammogram advised 4.  Prescribe BCPLow estrogen FE 1-20 Subjective:  Natasha Bean is a 31 y.o. female G1P1001 who presents for annual exam. Patient's last menstrual period was 09/01/2016. The patient has no pertinent complaints today. Pt is here for family planning.  The following portions of the patient's history were reviewed and updated as appropriate: allergies, current medications, past family history, past medical history, past social history, past surgical history and problem list. Past Medical History:  Diagnosis Date  . Anemia   . Asthma   . Contraceptive management 06/10/2015  . Hematuria 01/06/2015  . History of marijuana use 11/01/2014   Repeat UDS- Neg (04/19/2015)  . Migraine   . Pregnancy induced hypertension   . Round ligament pain 01/06/2015  . Trichimoniasis 01/06/2015   Repeat neg- 01/17/15  . Urinary frequency 01/06/2015  . Vaginal discharge during pregnancy in second trimester 01/06/2015    Past Surgical History:  Procedure Laterality Date  . NO PAST SURGERIES       Current Outpatient Prescriptions:  .  amLODipine (NORVASC) 5 MG tablet, Take 5 mg by mouth daily., Disp: , Rfl:  .  ibuprofen (ADVIL,MOTRIN) 800 MG tablet, Take 1 tablet (800 mg total) by mouth 3 (three) times daily. (Patient taking differently: Take 800 mg by mouth as needed. ), Disp: 21 tablet, Rfl: 0 .  IRON PO, Take by mouth daily., Disp: , Rfl:   Review of Systems Constitutional: negative Gastrointestinal: negative Genitourinary: negative  Objective:  BP 120/70 (BP Location: Right Arm, Patient Position: Sitting, Cuff Size: Normal)   Pulse 82   Ht 5' 2.5" (1.588 m)   Wt 161 lb 3.2 oz (73.1 kg)   LMP 09/01/2016   Breastfeeding? No   BMI 29.01 kg/m    BMI: Body mass index is 29.01 kg/m.  General Appearance: Alert, appropriate appearance for age. No acute  distress HEENT: Grossly normal Neck / Thyroid:  Cardiovascular: RRR; normal S1, S2, no murmur Lungs: CTA bilaterally Back: No CVAT Breast Exam: No masses or nodes.No dimpling, nipple retraction or discharge. Gastrointestinal: Soft, non-tender, no masses or organomegaly Pelvic Exam:  Pelvic -  VULVA: normal appearing vulva with no masses, tenderness or lesions,  VAGINA: normal appearing vagina with normal color and discharge, no lesions,  CERVIX: multiparous, normal appearing cervix without discharge or lesions,  UTERUS: anteflexed, well supported, uterus is normal size, shape, consistency and nontender,  ADNEXA: normal adnexa in size, nontender and no masses  Rectovaginal: not indicated Lymphatic Exam: Non-palpable nodes in neck, clavicular, axillary, or inguinal regions Skin: no rash or abnormalities Neurologic: Normal gait and speech, no tremor  Psychiatric: Alert and oriented, appropriate affect.  Urinalysis:Not done  Natasha Bean. MD Pgr [phone] 12:56 PM  By signing my name below, I, Izna Ahmed, attest that this documentation has been prepared under the direction and in the presence of Tilda BurrowFerguson, Shiron Whetsel V, MD. Electronically Signed: Redge GainerIzna Ahmed, ED Scribe. 09/26/16. 12:59 PM.  I personally performed the services described in this documentation, which was SCRIBED in my presence. The recorded information has been reviewed and considered accurate. It has been edited as necessary during review. Tilda BurrowFERGUSON,Gertrude Bucks V, MD

## 2016-09-27 LAB — CYTOLOGY - PAP
Chlamydia: NEGATIVE
DIAGNOSIS: NEGATIVE
HPV (WINDOPATH): NOT DETECTED
Neisseria Gonorrhea: NEGATIVE

## 2016-10-27 IMAGING — DX DG CHEST 2V
2 series · 2 of 2 positions shown · non-contrast
Comparison: June 08, 2010

CLINICAL DATA: Centered chest pain since 9 this morning. Childhood
asthma. Smoker

EXAM:
CHEST  2 VIEW

[chest pa]
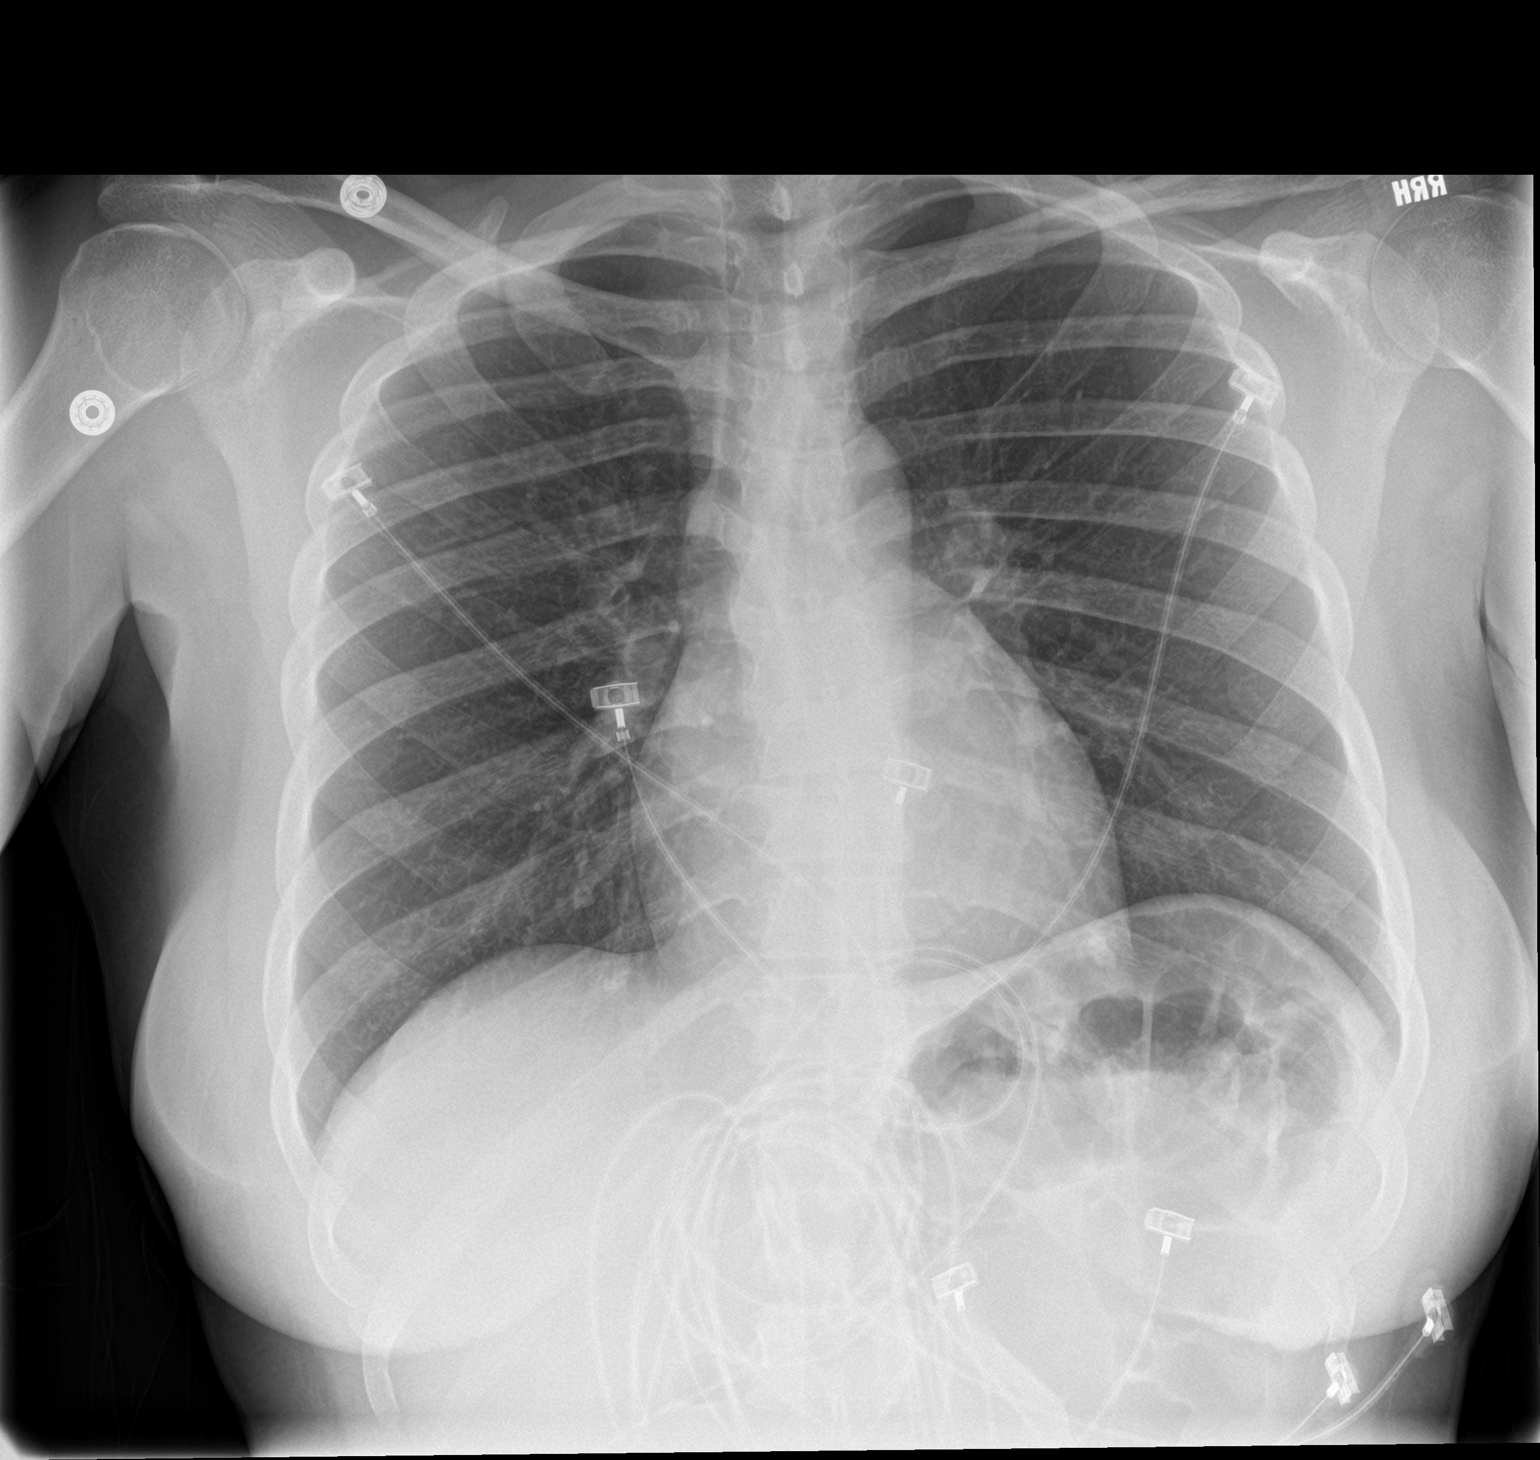

[chest lat]
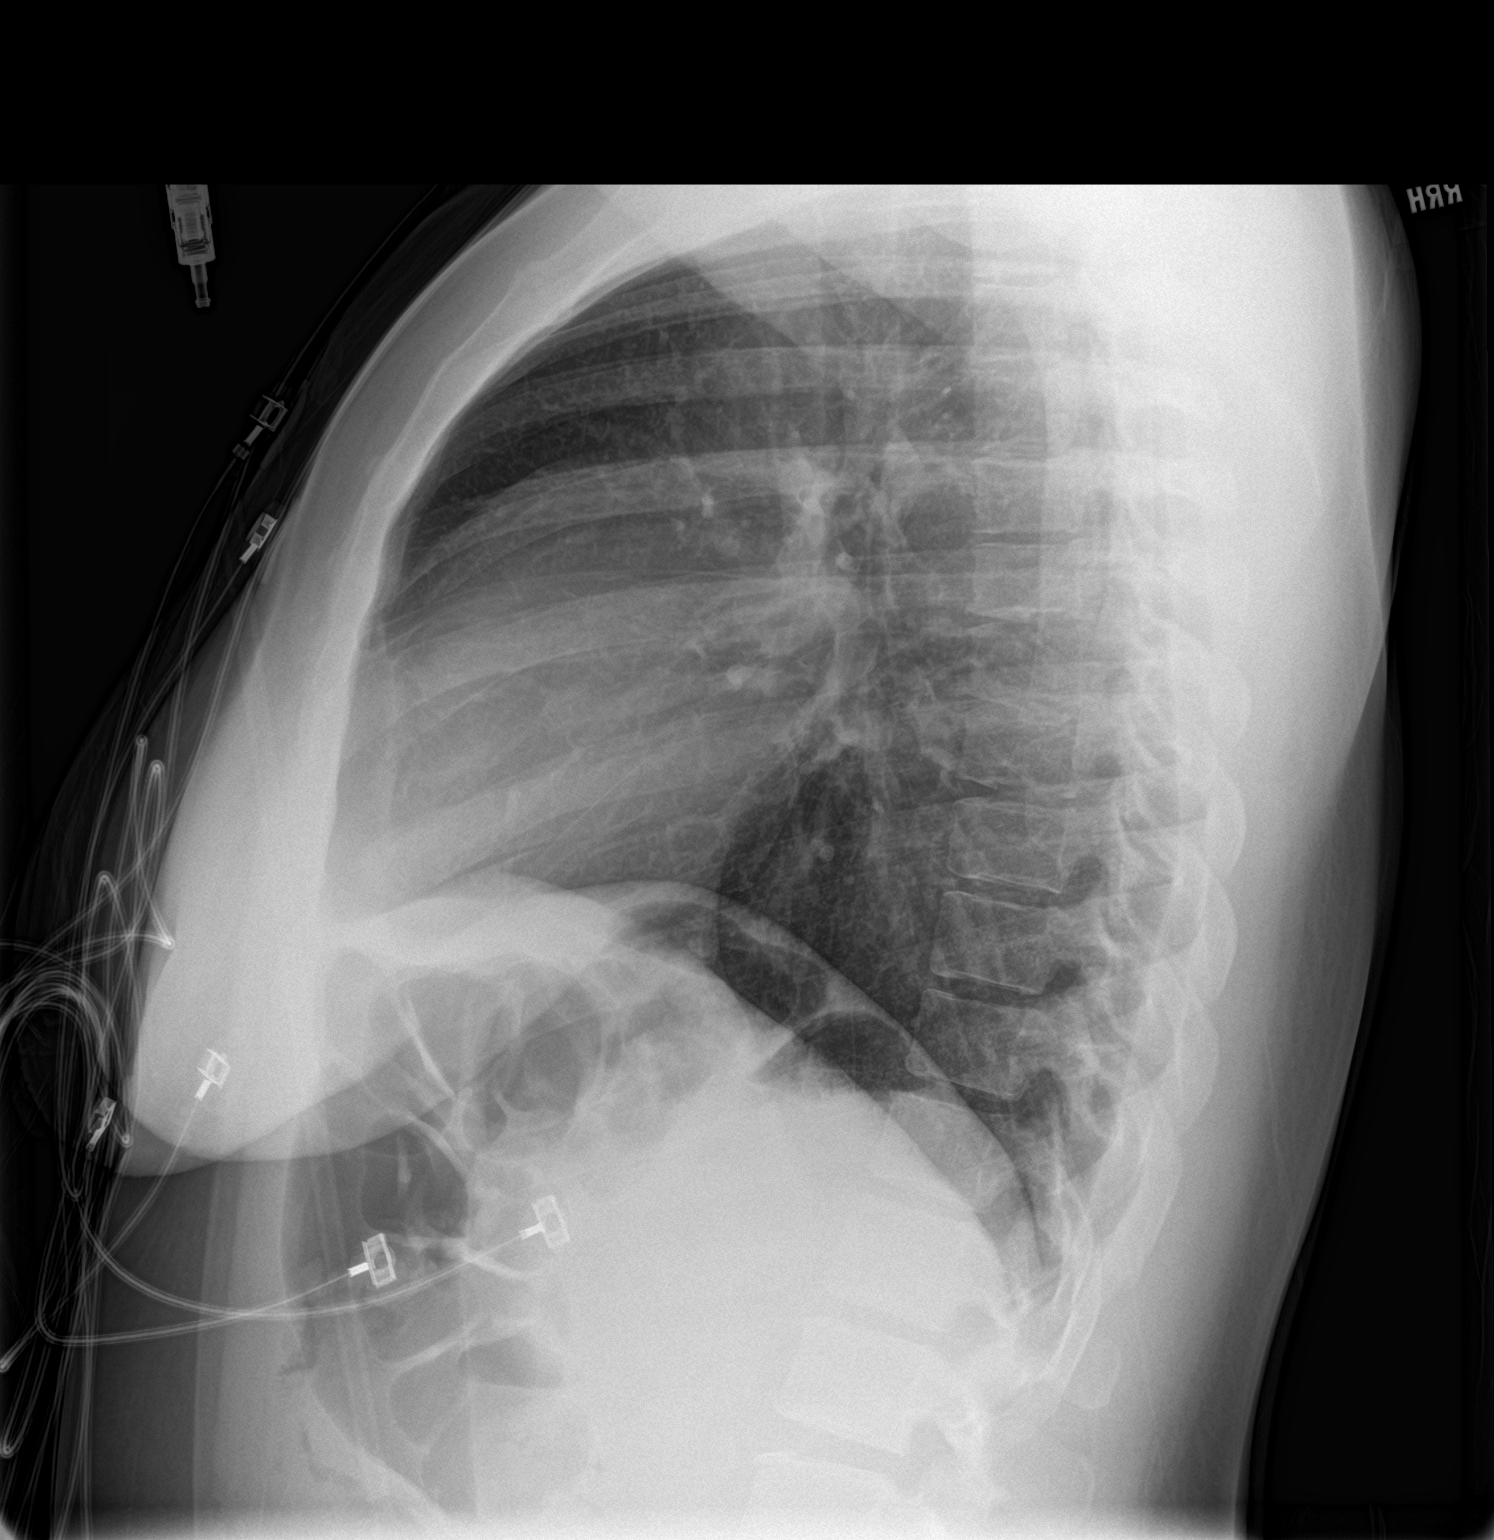

[2 of 2 positions shown; findings below may reference images not displayed]

FINDINGS: The heart size and mediastinal contours are within normal limits.
Both lungs are clear. The visualized skeletal structures are
unremarkable.
IMPRESSION: No active cardiopulmonary disease.

## 2016-11-30 ENCOUNTER — Ambulatory Visit (INDEPENDENT_AMBULATORY_CARE_PROVIDER_SITE_OTHER): Payer: Medicaid Other | Admitting: Obstetrics and Gynecology

## 2016-11-30 ENCOUNTER — Encounter: Payer: Self-pay | Admitting: Obstetrics and Gynecology

## 2016-11-30 VITALS — BP 120/80 | HR 70 | Ht 62.0 in | Wt 160.8 lb

## 2016-11-30 DIAGNOSIS — N939 Abnormal uterine and vaginal bleeding, unspecified: Secondary | ICD-10-CM | POA: Diagnosis not present

## 2016-11-30 DIAGNOSIS — N921 Excessive and frequent menstruation with irregular cycle: Secondary | ICD-10-CM

## 2016-11-30 DIAGNOSIS — Z3041 Encounter for surveillance of contraceptive pills: Secondary | ICD-10-CM

## 2016-11-30 DIAGNOSIS — Z3202 Encounter for pregnancy test, result negative: Secondary | ICD-10-CM

## 2016-11-30 LAB — POCT URINE PREGNANCY: PREG TEST UR: NEGATIVE

## 2016-11-30 MED ORDER — NORGESTIMATE-ETH ESTRADIOL 0.25-35 MG-MCG PO TABS: 1.0000 | ORAL_TABLET | Freq: Every day | ORAL | 11 refills | Status: DC

## 2016-11-30 NOTE — Progress Notes (Signed)
[facility] Visit  11/30/2016            Patient name: Natasha Bean MRN [id number]  Date of birth: 01/11/1986  CC & HPI:  Natasha Bean is a 31 y.o. female presenting today for heavy vaginal bleeding for 2 months after starting Loestrin on 09/26/16. She has associated symptoms of cramping. Pt was prescribed the Loestrin 1-20 for family planning purposes. She has 2 more packs left, and has been compliant with taking the pills. She denies any alleviating factors. Pt has not tried any medications.  ROS:  ROS +heavy vaginal bleeding +abdominal cramping -fever -pain All systems are negative except as noted in the HPI and PMH.    Pertinent History Reviewed:   Reviewed: Significant Medical         Past Medical History:  Diagnosis Date  . Anemia   . Asthma   . Contraceptive management 06/10/2015  . Hematuria 01/06/2015  . History of marijuana use 11/01/2014   Repeat UDS- Neg (04/19/2015)  . Migraine   . Pregnancy induced hypertension   . Round ligament pain 01/06/2015  . Trichimoniasis 01/06/2015   Repeat neg- 01/17/15  . Urinary frequency 01/06/2015  . Vaginal discharge during pregnancy in second trimester 01/06/2015                              Surgical Hx:    Past Surgical History:  Procedure Laterality Date  . NO PAST SURGERIES     Medications: Reviewed & Updated - see associated section                       Current Outpatient Prescriptions:  .  acetaminophen (TYLENOL) 500 MG tablet, Take 1,000 mg by mouth 2 (two) times daily as needed., Disp: , Rfl:  .  amLODipine (NORVASC) 5 MG tablet, Take 5 mg by mouth daily., Disp: , Rfl:  .  ibuprofen (ADVIL,MOTRIN) 800 MG tablet, Take 1 tablet (800 mg total) by mouth 3 (three) times daily. (Patient taking differently: Take 800 mg by mouth as needed. ), Disp: 21 tablet, Rfl: 0 .  IRON PO, Take by mouth daily., Disp: , Rfl:  .  Norethindrone Acetate-Ethinyl Estrad-FE (LOESTRIN 24 FE) 1-20 MG-MCG(24) tablet, Take 1 tablet  by mouth daily., Disp: 1 Package, Rfl: 11   Social History: Reviewed -  reports that she has been smoking Cigarettes.  She has a 3.50 pack-year smoking history. She has never used smokeless tobacco.  Objective Findings:  Vitals: Blood pressure 120/80, pulse 70, height  (1.575 m), weight 160 lb 12.8 oz (72.9 kg), not currently breastfeeding.  Physical Examination: General appearance - alert, well appearing, and in no distress Mental status - alert, oriented to person, place, and time perrrl eomi Abd soft. Pelvic exam:  VULVA: normal VAGINA: heavy menstrual bleedingCervix without lesions without purulence CERVIX: normal appearing cervix without discharge or lesions,  UTERUS: anterior, upper limits, normal size and nontender ADNEXA: normal    Assessment & Plan:   A:  1. AUB associated with birth control use  P:  1. We will discontinue the Loestrin 20 g pills were increased estrogen to 35 g by prescribing Sprintec, and discontinue Loestrin 2. Schedule U/S to assess endometrial cavity, and  If bleeding continues to be a problem add booster pill of estradiol 1 mg daily patient will let us know  3. F/u in 2 months  By signing my name below, I, Izna Ahmed, attest that this documentation has been prepared under the direction and in the presence of Tilda Burrow, MD. Electronically Signed: Redge Gainer, Medical Scribe. 11/30/16. 11:10 AM.  I personally performed the services described in this documentation, which was SCRIBED in my presence. The recorded information has been reviewed and considered accurate. It has been edited as necessary during review. Tilda Burrow, MD

## 2016-12-06 ENCOUNTER — Other Ambulatory Visit: Payer: Medicaid Other

## 2017-01-30 ENCOUNTER — Ambulatory Visit: Payer: Medicaid Other | Admitting: Obstetrics and Gynecology

## 2017-02-07 ENCOUNTER — Encounter: Payer: Self-pay | Admitting: Obstetrics and Gynecology

## 2017-02-07 ENCOUNTER — Ambulatory Visit (INDEPENDENT_AMBULATORY_CARE_PROVIDER_SITE_OTHER): Payer: Medicaid Other | Admitting: Obstetrics and Gynecology

## 2017-02-07 ENCOUNTER — Other Ambulatory Visit: Payer: Self-pay

## 2017-02-07 VITALS — BP 130/84 | HR 97 | Ht 62.0 in | Wt 154.0 lb

## 2017-02-07 DIAGNOSIS — N921 Excessive and frequent menstruation with irregular cycle: Secondary | ICD-10-CM | POA: Insufficient documentation

## 2017-02-07 DIAGNOSIS — Z3202 Encounter for pregnancy test, result negative: Secondary | ICD-10-CM | POA: Diagnosis not present

## 2017-02-07 DIAGNOSIS — Z309 Encounter for contraceptive management, unspecified: Secondary | ICD-10-CM

## 2017-02-07 LAB — POCT URINE PREGNANCY: PREG TEST UR: NEGATIVE

## 2017-02-07 MED ORDER — METOCLOPRAMIDE HCL 5 MG PO TABS: 5.0000 mg | ORAL_TABLET | Freq: Every evening | ORAL | 2 refills | Status: DC

## 2017-02-07 NOTE — Progress Notes (Addendum)
[facility] Visit  02/07/2017            Patient name: Natasha Bean MRN [id number]  Date of birth: 03/11/1986  CC & HPI:  Natasha Bean is a 31 y.o. female presenting today for a follow up appointment for AUB associated with birth control use. She was last seen in office on 11/30/2016, where she was advised to discontinue Loestrin 20 ug, and begin Sprintec. She was also advised to schedule a pelvic U/S to assess endometrial cavity, and if her bleeding continued, a booster pill of estradiol 1 mg could be added. She states she has associated symptoms of nausea due to the Sprintec. She experiences it in the mornings, and occasionally throws up. She does not want to go back to the lower dose Loestrin, and states her nausea is manageable. She takes her Sprintec at noon, and wakes up the next morning feeling nauseous.     ROS:  ROS  (+) nausea (-) fever All systems are negative except as noted in the HPI and PMH.   Pertinent History Reviewed:   Reviewed: Significant for AUB Medical         Past Medical History:  Diagnosis Date  . Anemia   . Asthma   . Contraceptive management 06/10/2015  . Hematuria 01/06/2015  . History of marijuana use 11/01/2014   Repeat UDS- Neg (04/19/2015)  . Migraine   . Pregnancy induced hypertension   . Round ligament pain 01/06/2015  . Trichimoniasis 01/06/2015   Repeat neg- 01/17/15  . Urinary frequency 01/06/2015  . Vaginal discharge during pregnancy in second trimester 01/06/2015                              Surgical Hx:    Past Surgical History:  Procedure Laterality Date  . NO PAST SURGERIES     Medications: Reviewed & Updated - see associated section                       Current Outpatient Medications:  .  acetaminophen (TYLENOL) 500 MG tablet, Take 1,000 mg by mouth 2 (two) times daily as needed., Disp: , Rfl:  .  amLODipine (NORVASC) 5 MG tablet, Take 5 mg by mouth daily., Disp: , Rfl:  .  ibuprofen (ADVIL,MOTRIN) 800 MG  tablet, Take 1 tablet (800 mg total) by mouth 3 (three) times daily. (Patient taking differently: Take 800 mg by mouth as needed. ), Disp: 21 tablet, Rfl: 0 .  IRON PO, Take by mouth daily., Disp: , Rfl:  .  norgestimate-ethinyl estradiol (ORTHO-CYCLEN,SPRINTEC,PREVIFEM) 0.25-35 MG-MCG tablet, Take 1 tablet by mouth daily., Disp: 1 Package, Rfl: 11   Social History: Reviewed -  reports that she has been smoking cigarettes.  She has a 3.50 pack-year smoking history. she has never used smokeless tobacco.  Objective Findings:  Vitals: not currently breastfeeding.  Physical Examination: General appearance - alert, well appearing, and in no distress Mental status - alert, oriented to person, place, and time Pelvic - Not indicated   Discussion: 1. Discussed with pt to resolve nausea with Reglan.   At end of discussion, pt had opportunity to ask questions and has no further questions at this time.   Specific discussion as noted above. Greater than 50% was spent in counseling and coordination of care with the patient.   Total time greater than: 15 minutes.     Assessment &  Plan:   A:  1. Resolved AUB,  2. Morning nausea? Hormone related  P:  1. Add Reglan HS for 30 days, take 1 daily each evening , to see if am nausea can be reduced.. Will also try reglan in the morning to see if it is more effective. 2. F/u PRN, pt will send a note advising whether Reglan helped or not    By signing my name below, I, Izna Ahmed, attest that this documentation has been prepared under the direction and in the presence of Tilda BurrowFerguson, Georgeanne Frankland V, MD. Electronically Signed: Redge GainerIzna Ahmed, Medical Scribe. 02/07/17. 11:44 AM.  I personally performed the services described in this documentation, which was SCRIBED in my presence. The recorded information has been reviewed and considered accurate. It has been edited as necessary during review. Tilda BurrowJohn V Akasia Ahmad, MD

## 2017-03-25 ENCOUNTER — Ambulatory Visit: Payer: Medicaid Other | Admitting: Obstetrics and Gynecology

## 2017-04-04 ENCOUNTER — Ambulatory Visit: Payer: Medicaid Other | Admitting: Obstetrics and Gynecology

## 2017-04-12 ENCOUNTER — Ambulatory Visit: Payer: Medicaid Other | Admitting: Obstetrics and Gynecology

## 2017-04-17 ENCOUNTER — Ambulatory Visit (INDEPENDENT_AMBULATORY_CARE_PROVIDER_SITE_OTHER): Payer: Medicaid Other | Admitting: Obstetrics and Gynecology

## 2017-04-17 VITALS — BP 136/80 | Ht 62.0 in | Wt 152.8 lb

## 2017-04-17 DIAGNOSIS — N9412 Deep dyspareunia: Secondary | ICD-10-CM | POA: Diagnosis not present

## 2017-04-17 DIAGNOSIS — N939 Abnormal uterine and vaginal bleeding, unspecified: Secondary | ICD-10-CM | POA: Diagnosis not present

## 2017-04-17 NOTE — Progress Notes (Signed)
Patient ID: ANMOL PASCHEN, female   DOB: October 11, 1985, 32 y.o.   MRN: [id number]   [facility] Clinic Visit  @DATE @            Patient name: Natasha Bean MRN [id number]  Date of birth: Sep 18, 1985  CC & HPI:  Natasha Bean is a 32 y.o. female presenting today for bleeding after intercourse. She reports that it is BRB and looks as if someone is hurt from the amount of blood. Associated symptoms include dyspareunia. She describes the pain as in the middle. Natasha Bean states that her pain is not worse with different positions. The patient states that her symptoms have changed because seen was seen in the past for abnormal uterine bleeding, but now it is only after intercourse. She denies fever, chills or any other symptoms or complaints at this time.  ROS:  ROS +bleeding after intercourse +dyspareunia -fever -chills All systems are negative except as noted in the HPI and PMH.   Pertinent History Reviewed:   Reviewed: Significant for hematuria  Medical         Past Medical History:  Diagnosis Date  . Anemia   . Asthma   . Contraceptive management 06/10/2015  . Hematuria 01/06/2015  . History of marijuana use 11/01/2014   Repeat UDS- Neg (04/19/2015)  . Migraine   . Pregnancy induced hypertension   . Round ligament pain 01/06/2015  . Trichimoniasis 01/06/2015   Repeat neg- 01/17/15  . Urinary frequency 01/06/2015  . Vaginal discharge during pregnancy in second trimester 01/06/2015                              Surgical Hx:    Past Surgical History:  Procedure Laterality Date  . NO PAST SURGERIES     Medications: Reviewed & Updated - see associated section                       Current Outpatient Medications:  .  amLODipine (NORVASC) 5 MG tablet, Take 5 mg by mouth daily., Disp: , Rfl:  .  norgestimate-ethinyl estradiol (ORTHO-CYCLEN,SPRINTEC,PREVIFEM) 0.25-35 MG-MCG tablet, Take 1 tablet by mouth daily., Disp: 1 Package, Rfl: 11 .  acetaminophen (TYLENOL) 500 MG tablet, Take  1,000 mg by mouth 2 (two) times daily as needed., Disp: , Rfl:  .  ibuprofen (ADVIL,MOTRIN) 800 MG tablet, Take 1 tablet (800 mg total) by mouth 3 (three) times daily. (Patient not taking: Reported on 04/17/2017), Disp: 21 tablet, Rfl: 0 .  IRON PO, Take by mouth daily., Disp: , Rfl:  .  metoCLOPramide (REGLAN) 5 MG tablet, Take 1 tablet (5 mg total) by mouth every evening. (Patient not taking: Reported on 04/17/2017), Disp: 30 tablet, Rfl: 2   Social History: Reviewed -  reports that she has been smoking cigarettes.  She has a 3.50 pack-year smoking history. she has never used smokeless tobacco.  Objective Findings:  Vitals: Blood pressure 136/80, height 5\' 2"  (1.575 m), weight 152 lb 12.8 oz (69.3 kg), not currently breastfeeding.  PHYSICAL EXAMINATION General appearance - alert, well appearing, and in no distress, oriented to person, place, and time and normal appearing weight Mental status - alert, oriented to person, place, and time, normal mood, behavior, speech, dress, motor activity, and thought processes, affect appropriate to mood  PELVIC External genitalia - normal Vagina - normal, strong support Cervix - appears normal  Uterus - first degree descensus, anterior, antiflexed,  contact to cervix and lower uterus reproduces dyspareunia  Adnexa - negative  Assessment & Plan:   A:  1. Deep thrust dyspareunia secondary to uterine contact 2. Cervical bleeding from uterina contact  P:  1. Anatomy explained with pictures 2. position changes PRN 3. F/u PRN, might need to consider treatment with a vaginal gel, and then to  consider cryocautery if persistent postcoital bleeding  By signing my name below, I, Diona BrownerJennifer Gorman, attest that this documentation has been prepared under the direction and in the presence of Tilda BurrowFerguson, Jamarkus Lisbon V, MD. Electronically Signed: Diona BrownerJennifer Gorman, Medical Scribe. 04/17/17. 2:38 PM.  I personally performed the services described in this documentation, which  was SCRIBED in my presence. The recorded information has been reviewed and considered accurate. It has been edited as necessary during review. Tilda BurrowJohn V Vernie Vinciguerra, MD

## 2017-04-19 LAB — GC/CHLAMYDIA PROBE AMP
Chlamydia trachomatis, NAA: NEGATIVE
Neisseria gonorrhoeae by PCR: NEGATIVE

## 2017-06-19 ENCOUNTER — Other Ambulatory Visit: Payer: Self-pay

## 2017-06-19 ENCOUNTER — Emergency Department (HOSPITAL_COMMUNITY): Payer: Self-pay

## 2017-06-19 ENCOUNTER — Emergency Department (HOSPITAL_COMMUNITY)
Admission: EM | Admit: 2017-06-19 | Discharge: 2017-06-19 | Disposition: A | Discharge status: Home / Self Care | Payer: Self-pay | Attending: Emergency Medicine | Admitting: Emergency Medicine

## 2017-06-19 ENCOUNTER — Encounter (HOSPITAL_COMMUNITY): Payer: Self-pay | Admitting: Emergency Medicine

## 2017-06-19 DIAGNOSIS — J45909 Unspecified asthma, uncomplicated: Secondary | ICD-10-CM | POA: Insufficient documentation

## 2017-06-19 DIAGNOSIS — F17210 Nicotine dependence, cigarettes, uncomplicated: Secondary | ICD-10-CM | POA: Insufficient documentation

## 2017-06-19 DIAGNOSIS — R42 Dizziness and giddiness: Secondary | ICD-10-CM | POA: Insufficient documentation

## 2017-06-19 DIAGNOSIS — R0602 Shortness of breath: Secondary | ICD-10-CM | POA: Insufficient documentation

## 2017-06-19 DIAGNOSIS — R0789 Other chest pain: Secondary | ICD-10-CM | POA: Insufficient documentation

## 2017-06-19 DIAGNOSIS — Z79899 Other long term (current) drug therapy: Secondary | ICD-10-CM | POA: Insufficient documentation

## 2017-06-19 MED ORDER — ACETAMINOPHEN 500 MG PO TABS: 1000.0000 mg | ORAL_TABLET | Freq: Once | ORAL | Status: DC

## 2017-06-19 MED ORDER — ACETAMINOPHEN 500 MG PO TABS
ORAL_TABLET | ORAL | Status: AC
  Administered 2017-06-19: 1000 mg
  Filled 2017-06-19: qty 2

## 2017-06-19 MED ORDER — DEXAMETHASONE SODIUM PHOSPHATE 10 MG/ML IJ SOLN
10.0000 mg | Freq: Once | INTRAMUSCULAR | Status: AC
  Administered 2017-06-19: 10 mg via INTRAMUSCULAR
  Filled 2017-06-19: qty 1

## 2017-06-19 MED ORDER — PREDNISONE 20 MG PO TABS: 40.0000 mg | ORAL_TABLET | Freq: Every day | ORAL | 0 refills | Status: DC

## 2017-06-19 MED ORDER — ALBUTEROL SULFATE (2.5 MG/3ML) 0.083% IN NEBU
5.0000 mg | INHALATION_SOLUTION | Freq: Once | RESPIRATORY_TRACT | Status: AC
Start: 2017-06-19 — End: 2017-06-19
  Administered 2017-06-19: 5 mg via RESPIRATORY_TRACT
  Filled 2017-06-19: qty 6

## 2017-06-19 MED ORDER — ALBUTEROL SULFATE HFA 108 (90 BASE) MCG/ACT IN AERS
2.0000 | INHALATION_SPRAY | Freq: Four times a day (QID) | RESPIRATORY_TRACT | Status: DC
Start: 2017-06-19 — End: 2017-06-19
  Administered 2017-06-19: 2 via RESPIRATORY_TRACT
  Filled 2017-06-19: qty 6.7

## 2017-06-19 NOTE — Discharge Instructions (Signed)
As discussed, your evaluation today has been largely reassuring.  But, it is important that you monitor your condition carefully, and do not hesitate to return to the ED if you develop new, or concerning changes in your condition.  These use the provided steroids as directed, taken Zyrtec, as written on the bottle, and use your provided albuterol inhaler every 4 hours for the next 2 days.   Otherwise, please follow-up with your physician for appropriate ongoing care.

## 2017-06-19 NOTE — ED Provider Notes (Signed)
[facility] EMERGENCY DEPARTMENT Provider Note   CSN: [id number] Arrival date & time: 06/19/17  1151     History   Chief Complaint Chief Complaint  Patient presents with  . Shortness of Breath    HPI Natasha Bean is a 32 y.o. female.  HPI Presents with dyspnea, chest pain, lightheadedness. Onset was about 4 hours prior to ED arrival.  When she was in her usual state of health prior to the onset of symptoms.  When she has a history of asthma in the distant past, is a current smoker. Since onset the dyspnea has improved without clear intervention, chest pain is improved minimally. The chest pain is described as tightness. No recent fever, chills, cough, other illness.  Past Medical History:  Diagnosis Date  . Anemia   . Asthma   . Contraceptive management 06/10/2015  . Hematuria 01/06/2015  . History of marijuana use 11/01/2014   Repeat UDS- Neg (04/19/2015)  . Migraine   . Pregnancy induced hypertension   . Round ligament pain 01/06/2015  . Trichimoniasis 01/06/2015   Repeat neg- 01/17/15  . Urinary frequency 01/06/2015  . Vaginal discharge during pregnancy in second trimester 01/06/2015    Patient Active Problem List   Diagnosis Date Noted  . Breakthrough bleeding on birth control pills 02/07/2017  . Dizzy spells 03/08/2016  . Hypertension, postpartum condition or complication 09/01/2015  . Severe preeclampsia in postpartum period 05/24/2015  . Marijuana use 02/14/2015  . Anemia, postpartum 02/14/2015    Past Surgical History:  Procedure Laterality Date  . NO PAST SURGERIES       OB History    Gravida  1   Para  1   Term  1   Preterm      AB      Living  1     SAB      TAB      Ectopic      Multiple  0   Live Births  1            Home Medications    Prior to Admission medications   Medication Sig Start Date End Date Taking? Authorizing Provider  acetaminophen (TYLENOL) 500 MG tablet Take 1,000 mg by mouth 2 (two) times daily  as needed.   Yes [provider]  amLODipine (NORVASC) 5 MG tablet Take 5 mg by mouth daily.   Yes [provider]  IRON PO Take by mouth daily.   Yes [provider]  ibuprofen (ADVIL,MOTRIN) 800 MG tablet Take 1 tablet (800 mg total) by mouth 3 (three) times daily. Patient not taking: Reported on 04/17/2017 09/10/16   Eber HongMiller, Brian, MD  metoCLOPramide (REGLAN) 5 MG tablet Take 1 tablet (5 mg total) by mouth every evening. Patient not taking: Reported on 04/17/2017 02/07/17   Tilda BurrowFerguson, John V, MD  norgestimate-ethinyl estradiol (ORTHO-CYCLEN,SPRINTEC,PREVIFEM) 0.25-35 MG-MCG tablet Take 1 tablet by mouth daily. Patient not taking: Reported on 06/19/2017 11/30/16   Tilda BurrowFerguson, John V, MD    Family History Family History  Problem Relation Age of Onset  . Cancer Mother        throat  . Hypertension Father   . Diabetes Father   . Cancer Father        colon  . Heart disease Father   . Thyroid disease Father   . Stroke Father   . Kidney failure Father   . Heart disease Paternal Grandmother   . Hypertension Paternal Grandmother   . Diabetes Paternal  Grandmother   . Seizures Daughter     Social History Social History   Tobacco Use  . Smoking status: Current Every Day Smoker    Packs/day: 0.25    Years: 14.00    Pack years: 3.50    Types: Cigarettes  . Smokeless tobacco: Never Used  Substance Use Topics  . Alcohol use: Yes    Comment: wine sometimes  . Drug use: No    Types: Marijuana     Allergies   Benadryl [diphenhydramine hcl] and Fioricet [butalbital-apap-caffeine]   Review of Systems Review of Systems  Constitutional:       Per HPI, otherwise negative  HENT:       Per HPI, otherwise negative  Respiratory:       Per HPI, otherwise negative  Cardiovascular:       Per HPI, otherwise negative  Gastrointestinal: Negative for vomiting.  Endocrine:       Negative aside from HPI  Genitourinary:       Neg aside from HPI   Musculoskeletal:        Per HPI, otherwise negative  Skin: Negative.   Neurological: Negative for syncope.     Physical Exam Updated Vital Signs BP 129/79 (BP Location: Right Arm)   Pulse 72   Temp 98 F (36.7 C) (Oral)   Resp (!) 27   Ht 5\' 2"  (1.575 m)   Wt 68.9 kg (152 lb)   SpO2 100%   BMI 27.80 kg/m   Physical Exam  Constitutional: She is oriented to person, place, and time. She appears well-developed and well-nourished. No distress.  HENT:  Head: Normocephalic and atraumatic.  Eyes: Conjunctivae and EOM are normal.  Cardiovascular: Normal rate and regular rhythm.  Pulmonary/Chest: No stridor. She has decreased breath sounds. She has wheezes.  Abdominal: She exhibits no distension.  Musculoskeletal: She exhibits no edema.  Neurological: She is alert and oriented to person, place, and time. No cranial nerve deficit.  Skin: Skin is warm and dry.  Psychiatric: She has a normal mood and affect.  Nursing note and vitals reviewed.    ED Treatments / Results    EKG EKG Interpretation  Date/Time:  Wednesday June 19 2017 12:05:59 EDT Ventricular Rate:  66 PR Interval:    QRS Duration: 85 QT Interval:  405 QTC Calculation: 425 R Axis:   66 Text Interpretation:  Sinus rhythm Normal ECG Confirmed by Gerhard Munch [phone]) on 06/19/2017 1:20:28 PM   Radiology Dg Chest 2 View  Result Date: 06/19/2017 CLINICAL DATA:  Dyspnea.  Smoker.  Chest pain. EXAM: CHEST - 2 VIEW COMPARISON:  11/17/2015 chest radiograph. FINDINGS: Stable cardiomediastinal silhouette with normal heart size. No pneumothorax. No pleural effusion. Lungs appear clear, with no acute consolidative airspace disease and no pulmonary edema. IMPRESSION: No active cardiopulmonary disease. Electronically Signed   By: Delbert Phenix M.D.   On: 06/19/2017 13:20    Procedures Procedures (including critical care time)  Medications Ordered in ED Medications  acetaminophen (TYLENOL) tablet 1,000 mg (1,000 mg Oral Not Given 06/19/17  1348)  dexamethasone (DECADRON) injection 10 mg (10 mg Intramuscular Given 06/19/17 1332)  albuterol (PROVENTIL) (2.5 MG/3ML) 0.083% nebulizer solution 5 mg (5 mg Nebulization Given 06/19/17 1328)  acetaminophen (TYLENOL) 500 MG tablet (1,000 mg  Given 06/19/17 1342)     Initial Impression / Assessment and Plan / ED Course  I have reviewed the triage vital signs and the nursing notes.  Pertinent labs & imaging results that were available during my  care of the patient were reviewed by me and considered in my medical decision making (see chart for details).     2:21 PM Repeat exam patient is awake alert, in no distress, breathing has improved, and sounds better on repeat auscultation. We discussed x-ray findings, EKG results. Patient denies any urinary symptoms, other ongoing complaints. We discussed likelihood of reactive airway disease, possible bronchitis given her smoking history, history of asthma as a child. No evidence for pneumonia, ongoing coronary ischemia, bacteremia, sepsis, with reassuring vital signs, patient will be discharged with ongoing albuterol, short course of steroids, and primary care follow-up.  Final Clinical Impressions(s) / ED Diagnoses  Dyspnea Atypical chest pain   Gerhard Munch, MD 06/19/17 1422

## 2017-06-19 NOTE — ED Triage Notes (Signed)
Pt reports waking up feeling anxious, shaky, breathing fast, and having chest pain.  Pt hyperventilating on arrival and crying.  Talked into slowing breathing down.

## 2017-09-03 ENCOUNTER — Encounter (HOSPITAL_COMMUNITY): Payer: Self-pay | Admitting: *Deleted

## 2017-09-03 ENCOUNTER — Emergency Department (HOSPITAL_COMMUNITY)
Admission: EM | Admit: 2017-09-03 | Discharge: 2017-09-04 | Disposition: A | Discharge status: Home / Self Care | Payer: Medicaid Other | Attending: Emergency Medicine | Admitting: Emergency Medicine

## 2017-09-03 ENCOUNTER — Other Ambulatory Visit: Payer: Self-pay

## 2017-09-03 DIAGNOSIS — O218 Other vomiting complicating pregnancy: Secondary | ICD-10-CM | POA: Diagnosis not present

## 2017-09-03 DIAGNOSIS — O9989 Other specified diseases and conditions complicating pregnancy, childbirth and the puerperium: Secondary | ICD-10-CM | POA: Diagnosis not present

## 2017-09-03 DIAGNOSIS — O99331 Smoking (tobacco) complicating pregnancy, first trimester: Secondary | ICD-10-CM | POA: Insufficient documentation

## 2017-09-03 DIAGNOSIS — R1115 Cyclical vomiting syndrome unrelated to migraine: Secondary | ICD-10-CM

## 2017-09-03 DIAGNOSIS — F17210 Nicotine dependence, cigarettes, uncomplicated: Secondary | ICD-10-CM | POA: Insufficient documentation

## 2017-09-03 DIAGNOSIS — R109 Unspecified abdominal pain: Secondary | ICD-10-CM | POA: Insufficient documentation

## 2017-09-03 DIAGNOSIS — Z79899 Other long term (current) drug therapy: Secondary | ICD-10-CM | POA: Diagnosis not present

## 2017-09-03 DIAGNOSIS — O10011 Pre-existing essential hypertension complicating pregnancy, first trimester: Secondary | ICD-10-CM | POA: Insufficient documentation

## 2017-09-03 DIAGNOSIS — Z3A12 12 weeks gestation of pregnancy: Secondary | ICD-10-CM | POA: Insufficient documentation

## 2017-09-03 DIAGNOSIS — R1013 Epigastric pain: Secondary | ICD-10-CM

## 2017-09-03 LAB — CBC
HCT: 32.4 % — ABNORMAL LOW (ref 36.0–46.0)
Hemoglobin: 10.7 g/dL — ABNORMAL LOW (ref 12.0–15.0)
MCH: 30.1 pg (ref 26.0–34.0)
MCHC: 33 g/dL (ref 30.0–36.0)
MCV: 91.3 fL (ref 78.0–100.0)
PLATELETS: 288 10*3/uL (ref 150–400)
RBC: 3.55 MIL/uL — ABNORMAL LOW (ref 3.87–5.11)
RDW: 12.9 % (ref 11.5–15.5)
WBC: 8.6 10*3/uL (ref 4.0–10.5)

## 2017-09-03 NOTE — ED Triage Notes (Signed)
Pt c/o nausea x one week and states today she started to have vomiting; pt denies any diarrhea or urinary sx; pt c/o abdominal pain

## 2017-09-04 ENCOUNTER — Telehealth: Payer: Self-pay | Admitting: *Deleted

## 2017-09-04 LAB — URINALYSIS, ROUTINE W REFLEX MICROSCOPIC
BILIRUBIN URINE: NEGATIVE
GLUCOSE, UA: NEGATIVE mg/dL
Hgb urine dipstick: NEGATIVE
KETONES UR: NEGATIVE mg/dL
Leukocytes, UA: NEGATIVE
Nitrite: NEGATIVE
Protein, ur: NEGATIVE mg/dL
Specific Gravity, Urine: 1.013 (ref 1.005–1.030)
pH: 5 (ref 5.0–8.0)

## 2017-09-04 LAB — COMPREHENSIVE METABOLIC PANEL
ALBUMIN: 3.7 g/dL (ref 3.5–5.0)
ALK PHOS: 37 U/L — AB (ref 38–126)
ALT: 9 U/L (ref 0–44)
AST: 17 U/L (ref 15–41)
Anion gap: 7 (ref 5–15)
BUN: 12 mg/dL (ref 6–20)
CALCIUM: 8.8 mg/dL — AB (ref 8.9–10.3)
CO2: 20 mmol/L — AB (ref 22–32)
CREATININE: 0.9 mg/dL (ref 0.44–1.00)
Chloride: 109 mmol/L (ref 98–111)
GFR calc Af Amer: >60 mL/min (ref >60)
GFR calc non Af Amer: >60 mL/min (ref >60)
GLUCOSE: 146 mg/dL — AB (ref 70–99)
Potassium: 3.3 mmol/L — ABNORMAL LOW (ref 3.5–5.1)
SODIUM: 136 mmol/L (ref 135–145)
Total Bilirubin: 0.8 mg/dL (ref 0.3–1.2)
Total Protein: 6.8 g/dL (ref 6.5–8.1)

## 2017-09-04 LAB — LIPASE, BLOOD: Lipase: 26 U/L (ref 11–51)

## 2017-09-04 LAB — HCG, QUANTITATIVE, PREGNANCY: hCG, Beta Chain, Quant, S: 242883 m[IU]/mL — ABNORMAL HIGH (ref <5)

## 2017-09-04 MED ORDER — PROMETHAZINE HCL 25 MG PO TABS: 25.0000 mg | ORAL_TABLET | Freq: Three times a day (TID) | ORAL | 0 refills | Status: DC | PRN

## 2017-09-04 MED ORDER — ONDANSETRON HCL 4 MG/2ML IJ SOLN
4.0000 mg | Freq: Once | INTRAMUSCULAR | Status: AC
  Administered 2017-09-04: 4 mg via INTRAVENOUS
  Filled 2017-09-04: qty 2

## 2017-09-04 MED ORDER — PRENATAL COMPLETE 14-0.4 MG PO TABS: 1.0000 | ORAL_TABLET | Freq: Every day | ORAL | 1 refills | Status: DC

## 2017-09-04 MED ORDER — SODIUM CHLORIDE 0.9 % IV BOLUS
1000.0000 mL | Freq: Once | INTRAVENOUS | Status: AC
  Administered 2017-09-04: 1000 mL via INTRAVENOUS

## 2017-09-04 NOTE — ED Provider Notes (Signed)
[facility] EMERGENCY DEPARTMENT Provider Note   CSN: [id number] Arrival date & time: 09/03/17  2323    History   Chief Complaint Chief Complaint  Patient presents with  . Abdominal Pain    Pt seen with NP student, I performed history/physical/documentation HPI Natasha Bean is a 32 y.o. female.  The history is provided by the patient.  Abdominal Pain   This is a new problem. The current episode started more than 2 days ago. The problem occurs daily. The problem has been gradually worsening. The pain is located in the epigastric region. Associated symptoms include nausea and vomiting. Pertinent negatives include fever, diarrhea, hematochezia, melena, constipation and dysuria. The symptoms are aggravated by palpation. Nothing relieves the symptoms. Her past medical history does not include gallstones.  Patient with previous history of gestational hypertension presents with abdominal pain and vomiting Reports over the past several days she has had nausea and epigastric abdominal pain, and now is having nonbloody emesis.  No diarrhea.  No chest pain, but she does endorse heartburn.  No new medicines.  No sick contacts.  Last regular menstrual cycle was in April  Past Medical History:  Diagnosis Date  . Anemia   . Asthma   . Contraceptive management 06/10/2015  . Hematuria 01/06/2015  . History of marijuana use 11/01/2014   Repeat UDS- Neg (04/19/2015)  . Migraine   . Pregnancy induced hypertension   . Round ligament pain 01/06/2015  . Trichimoniasis 01/06/2015   Repeat neg- 01/17/15  . Urinary frequency 01/06/2015  . Vaginal discharge during pregnancy in second trimester 01/06/2015    Patient Active Problem List   Diagnosis Date Noted  . Breakthrough bleeding on birth control pills 02/07/2017  . Dizzy spells 03/08/2016  . Hypertension, postpartum condition or complication 09/01/2015  . Severe preeclampsia in postpartum period 05/24/2015  . Marijuana use 02/14/2015  .  Anemia, postpartum 02/14/2015    Past Surgical History:  Procedure Laterality Date  . NO PAST SURGERIES       OB History    Gravida  1   Para  1   Term  1   Preterm      AB      Living  1     SAB      TAB      Ectopic      Multiple  0   Live Births  1            Home Medications    Prior to Admission medications   Medication Sig Start Date End Date Taking? Authorizing Provider  acetaminophen (TYLENOL) 500 MG tablet Take 1,000 mg by mouth 2 (two) times daily as needed.    [provider]  amLODipine (NORVASC) 5 MG tablet Take 5 mg by mouth daily.    [provider]  IRON PO Take by mouth daily.    [provider]  norgestimate-ethinyl estradiol (ORTHO-CYCLEN,SPRINTEC,PREVIFEM) 0.25-35 MG-MCG tablet Take 1 tablet by mouth daily. Patient not taking: Reported on 06/19/2017 11/30/16   Tilda Burrow, MD    Family History Family History  Problem Relation Age of Onset  . Cancer Mother        throat  . Hypertension Father   . Diabetes Father   . Cancer Father        colon  . Heart disease Father   . Thyroid disease Father   . Stroke Father   . Kidney failure Father   . Heart disease Paternal Grandmother   .  Hypertension Paternal Grandmother   . Diabetes Paternal Grandmother   . Seizures Daughter     Social History Social History   Tobacco Use  . Smoking status: Current Every Day Smoker    Packs/day: 0.25    Years: 14.00    Pack years: 3.50    Types: Cigarettes  . Smokeless tobacco: Never Used  Substance Use Topics  . Alcohol use: Yes    Comment: wine sometimes  . Drug use: No    Types: Marijuana     Allergies   Benadryl [diphenhydramine hcl] and Fioricet [butalbital-apap-caffeine]   Review of Systems Review of Systems  Constitutional: Negative for fever.  Cardiovascular: Negative for chest pain.  Gastrointestinal: Positive for abdominal pain, nausea and vomiting. Negative for blood in stool, constipation,  diarrhea, hematochezia and melena.  Genitourinary: Negative for dysuria and vaginal bleeding.  All other systems reviewed and are negative.    Physical Exam Updated Vital Signs BP 134/81 (BP Location: Left Arm)   Pulse 61   Temp 98.2 F (36.8 C) (Oral)   Resp 18   Ht 1.575 m (5\' 2" )   Wt 67.1 kg (148 lb)   LMP 08/04/2017   SpO2 100%   BMI 27.07 kg/m   Physical Exam CONSTITUTIONAL: Well developed/well nourished HEAD: Normocephalic/atraumatic EYES: EOMI/PERRL ENMT: Mucous membranes moist NECK: supple no meningeal signs SPINE/BACK:entire spine nontender CV: S1/S2 noted, no murmurs/rubs/gallops noted LUNGS: Lungs are clear to auscultation bilaterally, no apparent distress ABDOMEN: soft, mild epigastric tenderness, no rebound or guarding, bowel sounds noted throughout abdomen GU:no cva tenderness NEURO: Pt is awake/alert/appropriate, moves all extremitiesx4.  No facial droop.   EXTREMITIES: pulses normal/equal, full ROM SKIN: warm, color normal PSYCH: no abnormalities of mood noted, alert and oriented to situation   ED Treatments / Results  Labs (all labs ordered are listed, but only abnormal results are displayed) Labs Reviewed  COMPREHENSIVE METABOLIC PANEL - Abnormal; Notable for the following components:      Result Value   Potassium 3.3 (*)    CO2 20 (*)    Glucose, Bld 146 (*)    Calcium 8.8 (*)    Alkaline Phosphatase 37 (*)    All other components within normal limits  CBC - Abnormal; Notable for the following components:   RBC 3.55 (*)    Hemoglobin 10.7 (*)    HCT 32.4 (*)    All other components within normal limits  HCG, QUANTITATIVE, PREGNANCY - Abnormal; Notable for the following components:   hCG, Beta Chain, Quant, S 161,096 (*)    All other components within normal limits  LIPASE, BLOOD  URINALYSIS, ROUTINE W REFLEX MICROSCOPIC  I-STAT BETA HCG BLOOD, ED ([facility], [facility], [facility] ONLY)    EKG None  Radiology No results found.  Procedures Procedures      EMERGENCY DEPARTMENT [place] PREGNANCY "Study: Limited Ultrasound of the Pelvis for Pregnancy"  INDICATIONS:Pregnancy(required) and Abdominal or pelvic pain Multiple views of the uterus and pelvic cavity were obtained in real-time with a multi-frequency probe.  APPROACH:Transabdominal  PERFORMED BY: Myself IMAGES ARCHIVED?: Yes LIMITATIONS: Emergent procedure PREGNANCY FREE FLUID: None GESTATIONAL AGE, ESTIMATE: 12 weeks FETAL HEART RATE: present INTERPRETATION: Fetal heart activity seen    Medications Ordered in ED Medications  sodium chloride 0.9 % bolus 1,000 mL (1,000 mLs Intravenous New Bag/Given 09/04/17 0045)  ondansetron (ZOFRAN) injection 4 mg (4 mg Intravenous Given 09/04/17 0045)     Initial Impression / Assessment and Plan / ED Course  I have reviewed the triage  vital signs and the nursing notes.  Pertinent labs  results that were available during my care of the patient were reviewed by me and considered in my medical decision making (see chart for details).     12:30 AM Patient with epigastric abdominal pain and vomiting.  Overall she is well-appearing.  She declines pain meds.  IV fluids and Zofran ordered 12:46 AM Bedside HCG test is positive Will obtain labs HCG quant 2:23 AM By limited bedside ultrasound, patient is [redacted] weeks pregnant. Nausea is improved.  Labs otherwise unremarkable. No other focal abdominal tenderness, I have low suspicion for acute abdominal/GYN emergency at this time Patient feels comfortable for discharge.  She was counseled on stopping smoking.  She will continue Norvasc due to her hypertension, she will need to have close follow-up with OB/GYN whether this should be continued.  We will also add on prenatal vitamins.  We discussed strict ER return precautions Final Clinical Impressions(s) / ED Diagnoses   Final diagnoses:  Epigastric pain  [redacted] weeks gestation of pregnancy  Non-intractable cyclical vomiting with nausea    ED Discharge  Orders        Ordered    promethazine (PHENERGAN) 25 MG tablet  Every 8 hours PRN     09/04/17 0222    Prenatal Vit-Fe Fumarate-FA (PRENATAL COMPLETE) 14-0.4 MG TABS  Daily     09/04/17 [id number]       Christin Moline, MD 09/04/17 0225

## 2017-09-04 NOTE — ED Notes (Signed)
ED Provider at bedside for bedside US 

## 2017-09-04 NOTE — Telephone Encounter (Signed)
Spoke with pt. Pt was seen at ER last pm. Is [redacted] weeks pregnant. Pt takes Amlodipine 5 mg for BP. I spoke with Dr. Emelda FearFerguson and he advised that's fine to take during pregnancy. Pt has an US scheduled for 7/12. Advised to keep that appt. Pt voiced understanding. JSY

## 2017-09-05 ENCOUNTER — Telehealth: Payer: Self-pay | Admitting: *Deleted

## 2017-09-05 NOTE — Telephone Encounter (Signed)
Patient called phone nurse during lunch. She reported that she had bloody vomit. RN advised her to go to [facility] ED. Per chart review patient has not done so. I called patient and left message that I am calling to check on her. Call us back if she is still having problems.

## 2017-09-09 ENCOUNTER — Telehealth: Payer: Self-pay | Admitting: Obstetrics & Gynecology

## 2017-09-09 NOTE — Telephone Encounter (Signed)
Patient called stating that she is pregnant, she has her New OB US on 09/19/2017. Pt states that she has been having dizzy spells and pain of her back and side. Please contact pt

## 2017-09-09 NOTE — Telephone Encounter (Signed)
Patient states she has been having right flank pain along with nausea and dizziness.  No fever, chills or body aches.  Informed patient I could get her in this afternoon but patient stated she was at work but could come tomorrow.  Will come tomorrow at 4pm.

## 2017-09-10 ENCOUNTER — Ambulatory Visit (INDEPENDENT_AMBULATORY_CARE_PROVIDER_SITE_OTHER): Payer: Medicaid Other | Admitting: Obstetrics & Gynecology

## 2017-09-10 ENCOUNTER — Encounter: Payer: Self-pay | Admitting: Advanced Practice Midwife

## 2017-09-10 ENCOUNTER — Other Ambulatory Visit: Payer: Self-pay

## 2017-09-10 VITALS — BP 115/61 | HR 85 | Ht 62.0 in | Wt 151.0 lb

## 2017-09-10 DIAGNOSIS — Z331 Pregnant state, incidental: Secondary | ICD-10-CM

## 2017-09-10 DIAGNOSIS — Z1389 Encounter for screening for other disorder: Secondary | ICD-10-CM | POA: Diagnosis not present

## 2017-09-10 DIAGNOSIS — R1031 Right lower quadrant pain: Secondary | ICD-10-CM

## 2017-09-10 DIAGNOSIS — O200 Threatened abortion: Secondary | ICD-10-CM

## 2017-09-10 LAB — POCT URINALYSIS DIPSTICK
Glucose, UA: NEGATIVE
KETONES UA: NEGATIVE
LEUKOCYTES UA: NEGATIVE
NITRITE UA: NEGATIVE
Protein, UA: NEGATIVE
RBC UA: NEGATIVE

## 2017-09-10 MED ORDER — AMLODIPINE BESYLATE 5 MG PO TABS: 5.0000 mg | ORAL_TABLET | Freq: Every day | ORAL | 11 refills | Status: DC

## 2017-09-10 NOTE — Progress Notes (Signed)
Chief Complaint  Patient presents with  . right side pain      32 y.o. G2P1001 Patient's last menstrual period was 07/15/2017 (approximate). The current method of family planning is pregnant.  Outpatient Encounter Medications as of 09/10/2017  Medication Sig  . acetaminophen (TYLENOL) 500 MG tablet Take 1,000 mg by mouth 2 (two) times daily as needed.  Marland Kitchen amLODipine (NORVASC) 5 MG tablet Take 1 tablet (5 mg total) by mouth daily.  . Prenatal Vit-Fe Fumarate-FA (PRENATAL COMPLETE) 14-0.4 MG TABS Take 1 tablet by mouth daily.  . [DISCONTINUED] amLODipine (NORVASC) 5 MG tablet Take 5 mg by mouth daily.  . promethazine (PHENERGAN) 25 MG tablet Take 1 tablet (25 mg total) by mouth every 8 (eight) hours as needed for nausea or vomiting. (Patient not taking: Reported on 10/09/2017)  . [DISCONTINUED] IRON PO Take by mouth daily.   No facility-administered encounter medications on file as of 09/10/2017.     Subjective Bowen A Peale comes in early pregnant with RLQ pain for a few days moderate no bleeding No fever Past Medical History:  Diagnosis Date  . Anemia   . Asthma   . Contraceptive management 06/10/2015  . Hematuria 01/06/2015  . History of marijuana use 11/01/2014   Repeat UDS- Neg (04/19/2015)  . Migraine   . Pregnancy induced hypertension   . Round ligament pain 01/06/2015  . Trichimoniasis 01/06/2015   Repeat neg- 01/17/15  . Urinary frequency 01/06/2015  . Vaginal discharge during pregnancy in second trimester 01/06/2015    Past Surgical History:  Procedure Laterality Date  . NO PAST SURGERIES      OB History    Gravida  2   Para  1   Term  1   Preterm      AB      Living  1     SAB      TAB      Ectopic      Multiple  0   Live Births  1           Allergies  Allergen Reactions  . Benadryl [Diphenhydramine Hcl] Other (See Comments)    Makes her shakey  . Fioricet [Butalbital-Apap-Caffeine] Itching and Nausea And Vomiting    Able to  take tylenol     Social History   Socioeconomic History  . Marital status: Single    Spouse name: Not on file  . Number of children: Not on file  . Years of education: Not on file  . Highest education level: Not on file  Occupational History  . Not on file  Social Needs  . Financial resource strain: Not on file  . Food insecurity:    Worry: Not on file    Inability: Not on file  . Transportation needs:    Medical: Not on file    Non-medical: Not on file  Tobacco Use  . Smoking status: Current Every Day Smoker    Packs/day: 0.25    Years: 14.00    Pack years: 3.50    Types: Cigarettes  . Smokeless tobacco: Never Used  . Tobacco comment: 2-3 per day  Substance and Sexual Activity  . Alcohol use: Not Currently    Comment: wine sometimes  . Drug use: Not Currently    Types: Marijuana    Comment: last used 1 month ago  . Sexual activity: Yes    Birth control/protection: None  Lifestyle  . Physical activity:    Days per  week: Not on file    Minutes per session: Not on file  . Stress: Not on file  Relationships  . Social connections:    Talks on phone: Not on file    Gets together: Not on file    Attends religious service: Not on file    Active member of club or organization: Not on file    Attends meetings of clubs or organizations: Not on file    Relationship status: Not on file  Other Topics Concern  . Not on file  Social History Narrative  . Not on file    Family History  Problem Relation Age of Onset  . Cancer Mother        throat  . Hypertension Father   . Diabetes Father   . Cancer Father        colon  . Heart disease Father   . Thyroid disease Father   . Stroke Father   . Kidney failure Father   . Heart disease Paternal Grandmother   . Hypertension Paternal Grandmother   . Diabetes Paternal Grandmother   . Seizures Daughter     Medications:       Current Outpatient Medications:  .  acetaminophen (TYLENOL) 500 MG tablet, Take 1,000 mg by  mouth 2 (two) times daily as needed., Disp: , Rfl:  .  amLODipine (NORVASC) 5 MG tablet, Take 1 tablet (5 mg total) by mouth daily., Disp: 30 tablet, Rfl: 11 .  Prenatal Vit-Fe Fumarate-FA (PRENATAL COMPLETE) 14-0.4 MG TABS, Take 1 tablet by mouth daily., Disp: 60 each, Rfl: 1 .  albuterol (PROVENTIL HFA;VENTOLIN HFA) 108 (90 Base) MCG/ACT inhaler, Inhale into the lungs every 6 (six) hours as needed for wheezing or shortness of breath., Disp: , Rfl:  .  aspirin EC 81 MG tablet, Take 2 tablets (162 mg total) by mouth daily., Disp: 60 tablet, Rfl: 6 .  Doxylamine-Pyridoxine (DICLEGIS) 10-10 MG TBEC, 2 tabs q hs, if sx persist add 1 tab q am on day 3, if sx persist add 1 tab q afternoon on day 4 (Patient not taking: Reported on 10/09/2017), Disp: 100 tablet, Rfl: 6 .  labetalol (NORMODYNE) 200 MG tablet, Take 1 tablet (200 mg total) by mouth 2 (two) times daily., Disp: 60 tablet, Rfl: 3 .  promethazine (PHENERGAN) 25 MG tablet, Take 1 tablet (25 mg total) by mouth every 8 (eight) hours as needed for nausea or vomiting. (Patient not taking: Reported on 10/09/2017), Disp: 10 tablet, Rfl: 0 .  sertraline (ZOLOFT) 25 MG tablet, Take 1 tablet (25 mg total) by mouth daily., Disp: 30 tablet, Rfl: 6  Objective Blood pressure 115/61, pulse 85, height 5\' 2"  (1.575 m), weight 151 lb (68.5 kg), last menstrual period 07/15/2017.  Bedside POC sonogram reveals viable IUP 8 weeks size no adnexal pathology  Pertinent ROS -CVAT No burning with urination, frequency or urgency No nausea, vomiting or diarrhea Nor fever chills or other constitutional symptom  Labs or studies POC sonogram: singleto IUP vible FHR 160s, consistent with EGA No adnexal abnormalities are noted    Impression Diagnoses this Encounter::   ICD-10-CM   1. Threatened abortion O20.0   2. Pregnant state, incidental Z33.1 POCT urinalysis dipstick  3. Screening for genitourinary condition Z13.89 POCT urinalysis dipstick    Established  relevant diagnosis(es):   Plan/Recommendations: Meds ordered this encounter  Medications  . amLODipine (NORVASC) 5 MG tablet    Sig: Take 1 tablet (5 mg total) by mouth daily.  Dispense:  30 tablet    Refill:  11    Labs or Scans Ordered: Orders Placed This Encounter  Procedures  . POCT urinalysis dipstick    Management:: Keep scheduled NOB appt  Follow up Return for keep scheduled appt.     All questions were answered.

## 2017-09-19 ENCOUNTER — Other Ambulatory Visit: Payer: Self-pay | Admitting: Obstetrics and Gynecology

## 2017-09-19 DIAGNOSIS — O3680X0 Pregnancy with inconclusive fetal viability, not applicable or unspecified: Secondary | ICD-10-CM

## 2017-09-20 ENCOUNTER — Ambulatory Visit (INDEPENDENT_AMBULATORY_CARE_PROVIDER_SITE_OTHER): Payer: Medicaid Other

## 2017-09-20 DIAGNOSIS — O3680X0 Pregnancy with inconclusive fetal viability, not applicable or unspecified: Secondary | ICD-10-CM | POA: Diagnosis not present

## 2017-09-20 DIAGNOSIS — Z3A01 Less than 8 weeks gestation of pregnancy: Secondary | ICD-10-CM

## 2017-09-20 NOTE — Progress Notes (Signed)
US 10+4 wks,single IUP w/ys,positive fht 157 bpm,normal ovaries bilat,crl 37.82 mm

## 2017-10-01 ENCOUNTER — Telehealth: Payer: Self-pay | Admitting: *Deleted

## 2017-10-01 ENCOUNTER — Telehealth: Payer: Self-pay | Admitting: Obstetrics and Gynecology

## 2017-10-01 MED ORDER — DOXYLAMINE-PYRIDOXINE 10-10 MG PO TBEC: DELAYED_RELEASE_TABLET | ORAL | 6 refills | Status: DC

## 2017-10-01 NOTE — Telephone Encounter (Signed)
Pharmacy made aware diclegis approved.  

## 2017-10-01 NOTE — Telephone Encounter (Signed)
Pt having Nausea was given some meds at er but is out now can we call something in for her to [facility] [place]

## 2017-10-01 NOTE — Telephone Encounter (Signed)
LMOVM that Diclegis was sent to pharmacy.

## 2017-10-07 ENCOUNTER — Other Ambulatory Visit: Payer: Medicaid Other

## 2017-10-08 ENCOUNTER — Other Ambulatory Visit: Payer: Self-pay | Admitting: Obstetrics & Gynecology

## 2017-10-08 ENCOUNTER — Ambulatory Visit: Payer: Medicaid Other | Admitting: *Deleted

## 2017-10-08 DIAGNOSIS — Z3682 Encounter for antenatal screening for nuchal translucency: Secondary | ICD-10-CM

## 2017-10-09 ENCOUNTER — Ambulatory Visit (INDEPENDENT_AMBULATORY_CARE_PROVIDER_SITE_OTHER): Payer: Medicaid Other

## 2017-10-09 ENCOUNTER — Ambulatory Visit: Payer: Medicaid Other | Admitting: *Deleted

## 2017-10-09 ENCOUNTER — Encounter: Payer: Self-pay | Admitting: Advanced Practice Midwife

## 2017-10-09 ENCOUNTER — Ambulatory Visit (INDEPENDENT_AMBULATORY_CARE_PROVIDER_SITE_OTHER): Payer: Medicaid Other | Admitting: Advanced Practice Midwife

## 2017-10-09 VITALS — BP 123/72 | HR 84 | Wt 158.0 lb

## 2017-10-09 DIAGNOSIS — O10919 Unspecified pre-existing hypertension complicating pregnancy, unspecified trimester: Secondary | ICD-10-CM | POA: Insufficient documentation

## 2017-10-09 DIAGNOSIS — Z3682 Encounter for antenatal screening for nuchal translucency: Secondary | ICD-10-CM

## 2017-10-09 DIAGNOSIS — O99331 Smoking (tobacco) complicating pregnancy, first trimester: Secondary | ICD-10-CM

## 2017-10-09 DIAGNOSIS — O0990 Supervision of high risk pregnancy, unspecified, unspecified trimester: Secondary | ICD-10-CM | POA: Diagnosis not present

## 2017-10-09 DIAGNOSIS — Z1379 Encounter for other screening for genetic and chromosomal anomalies: Secondary | ICD-10-CM | POA: Diagnosis not present

## 2017-10-09 DIAGNOSIS — O0993 Supervision of high risk pregnancy, unspecified, third trimester: Secondary | ICD-10-CM

## 2017-10-09 DIAGNOSIS — I10 Essential (primary) hypertension: Secondary | ICD-10-CM

## 2017-10-09 DIAGNOSIS — Z3A13 13 weeks gestation of pregnancy: Secondary | ICD-10-CM | POA: Diagnosis not present

## 2017-10-09 DIAGNOSIS — F17200 Nicotine dependence, unspecified, uncomplicated: Secondary | ICD-10-CM | POA: Diagnosis not present

## 2017-10-09 DIAGNOSIS — Z331 Pregnant state, incidental: Secondary | ICD-10-CM

## 2017-10-09 DIAGNOSIS — Z1389 Encounter for screening for other disorder: Secondary | ICD-10-CM

## 2017-10-09 LAB — POCT URINALYSIS DIPSTICK OB
Blood, UA: NEGATIVE
Glucose, UA: NEGATIVE — AB
KETONES UA: NEGATIVE
Nitrite, UA: NEGATIVE
PROTEIN: NEGATIVE

## 2017-10-09 MED ORDER — LABETALOL HCL 300 MG PO TABS: 300.0000 mg | ORAL_TABLET | Freq: Two times a day (BID) | ORAL | 6 refills | Status: DC

## 2017-10-09 MED ORDER — ASPIRIN EC 81 MG PO TBEC: 162.0000 mg | DELAYED_RELEASE_TABLET | Freq: Every day | ORAL | 6 refills | Status: DC

## 2017-10-09 NOTE — Progress Notes (Signed)
Subjective:    Natasha Bean is a G2P1001 3060w2d being seen today for her first obstetrical visit.  Her obstetrical history is significant for CHTN, on Norvasc 5mg  qd since last baby.  Pregnancy history fully reviewed. SVD w/o problems 2017  Patient reports some HAs, taking APAP.  Vitals:   10/09/17 0934  BP: 123/72  Pulse: 84  Weight: 158 lb (71.7 kg)    HISTORY: OB History  Gravida Para Term Preterm AB Living  2 1 1     1   SAB TAB Ectopic Multiple Live Births        0 1    # Outcome Date GA Lbr Len/2nd Weight Sex Delivery Anes PTL Lv  2 Current           1 Term 05/19/15 461w1d 14:39 / 00:29 5 lb 11.7 oz (2.6 kg) F Vag-Spont EPI N LIV     Complications: Gestational hypertension   Past Medical History:  Diagnosis Date  . Anemia   . Asthma   . Contraceptive management 06/10/2015  . Hematuria 01/06/2015  . History of marijuana use 11/01/2014   Repeat UDS- Neg (04/19/2015)  . Migraine   . Pregnancy induced hypertension   . Round ligament pain 01/06/2015  . Trichimoniasis 01/06/2015   Repeat neg- 01/17/15  . Urinary frequency 01/06/2015  . Vaginal discharge during pregnancy in second trimester 01/06/2015   Past Surgical History:  Procedure Laterality Date  . NO PAST SURGERIES     Family History  Problem Relation Age of Onset  . Cancer Mother        throat  . Hypertension Father   . Diabetes Father   . Cancer Father        colon  . Heart disease Father   . Thyroid disease Father   . Stroke Father   . Kidney failure Father   . Heart disease Paternal Grandmother   . Hypertension Paternal Grandmother   . Diabetes Paternal Grandmother   . Seizures Daughter      Exam                                      System:     Skin: normal coloration and turgor, no rashes    Neurologic: oriented, normal, normal mood   Extremities: normal strength, tone, and muscle mass   HEENT PERRLA   Mouth/Teeth mucous membranes moist, normal dentition   Neck supple and  no masses   Cardiovascular: regular rate and rhythm   Respiratory:  appears well, vitals normal, no respiratory distress, acyanotic   Abdomen: soft, non-tender        The nature of [facility] with multiple MDs and other Advanced Practice Providers was explained to patient; also emphasized that residents, students are part of our team.  Assessment:    Pregnancy: G2P1001 Patient Active Problem List   Diagnosis Date Noted  . Supervision of high risk pregnancy, antepartum 10/09/2017  . Smoker 10/09/2017  . Benign hypertension 10/09/2017     US 13+2 wks,measurements c/w dates,normal ovaries bilat,NB present,NT 1.6 mm,anterior placenta,crl 72.84 mm     Plan:     Initial labs drawn. Continue prenatal vitamins  Change to labetalol 300mg  BID--can take BP at work, let me k now if it's too high or too low Problem list reviewed and updated  Reviewed n/v relief measures and warning s/s to report  Reviewed recommended weight gain based on pre-gravid BMI  Encouraged well-balanced diet Genetic Screening discussed Integrated Screen: results reviewed.  Ultrasound discussed; fetal survey: requested.  Return in about 1 month (around 11/06/2017) for HROB.  Jacklyn Shell 10/09/2017

## 2017-10-09 NOTE — Patient Instructions (Signed)
 First Trimester of Pregnancy The first trimester of pregnancy is from week 1 until the end of week 12 (months 1 through 3). A week after a sperm fertilizes an egg, the egg will implant on the wall of the uterus. This embryo will begin to develop into a baby. Genes from you and your partner are forming the baby. The female genes determine whether the baby is a boy or a girl. At 6-8 weeks, the eyes and face are formed, and the heartbeat can be seen on ultrasound. At the end of 12 weeks, all the baby's organs are formed.  Now that you are pregnant, you will want to do everything you can to have a healthy baby. Two of the most important things are to get good prenatal care and to follow your health care provider's instructions. Prenatal care is all the medical care you receive before the baby's birth. This care will help prevent, find, and treat any problems during the pregnancy and childbirth. BODY CHANGES Your body goes through many changes during pregnancy. The changes vary from woman to woman.   You may gain or lose a couple of pounds at first.  You may feel sick to your stomach (nauseous) and throw up (vomit). If the vomiting is uncontrollable, call your health care provider.  You may tire easily.  You may develop headaches that can be relieved by medicines approved by your health care provider.  You may urinate more often. Painful urination may mean you have a bladder infection.  You may develop heartburn as a result of your pregnancy.  You may develop constipation because certain hormones are causing the muscles that push waste through your intestines to slow down.  You may develop hemorrhoids or swollen, bulging veins (varicose veins).  Your breasts may begin to grow larger and become tender. Your nipples may stick out more, and the tissue that surrounds them (areola) may become darker.  Your gums may bleed and may be sensitive to brushing and flossing.  Dark spots or blotches  (chloasma, mask of pregnancy) may develop on your face. This will likely fade after the baby is born.  Your menstrual periods will stop.  You may have a loss of appetite.  You may develop cravings for certain kinds of food.  You may have changes in your emotions from day to day, such as being excited to be pregnant or being concerned that something may go wrong with the pregnancy and baby.  You may have more vivid and strange dreams.  You may have changes in your hair. These can include thickening of your hair, rapid growth, and changes in texture. Some women also have hair loss during or after pregnancy, or hair that feels dry or thin. Your hair will most likely return to normal after your baby is born. WHAT TO EXPECT AT YOUR PRENATAL VISITS During a routine prenatal visit:  You will be weighed to make sure you and the baby are growing normally.  Your blood pressure will be taken.  Your abdomen will be measured to track your baby's growth.  The fetal heartbeat will be listened to starting around week 10 or 12 of your pregnancy.  Test results from any previous visits will be discussed. Your health care provider may ask you:  How you are feeling.  If you are feeling the baby move.  If you have had any abnormal symptoms, such as leaking fluid, bleeding, severe headaches, or abdominal cramping.  If you have any questions. Other   tests that may be performed during your first trimester include:  Blood tests to find your blood type and to check for the presence of any previous infections. They will also be used to check for low iron levels (anemia) and Rh antibodies. Later in the pregnancy, blood tests for diabetes will be done along with other tests if problems develop.  Urine tests to check for infections, diabetes, or protein in the urine.  An ultrasound to confirm the proper growth and development of the baby.  An amniocentesis to check for possible genetic problems.  Fetal  screens for spina bifida and Down syndrome.  You may need other tests to make sure you and the baby are doing well. HOME CARE INSTRUCTIONS  Medicines  Follow your health care provider's instructions regarding medicine use. Specific medicines may be either safe or unsafe to take during pregnancy.  Take your prenatal vitamins as directed.  If you develop constipation, try taking a stool softener if your health care provider approves. Diet  Eat regular, well-balanced meals. Choose a variety of foods, such as meat or vegetable-based protein, fish, milk and low-fat dairy products, vegetables, fruits, and whole grain breads and cereals. Your health care provider will help you determine the amount of weight gain that is right for you.  Avoid raw meat and uncooked cheese. These carry germs that can cause birth defects in the baby.  Eating four or five small meals rather than three large meals a day may help relieve nausea and vomiting. If you start to feel nauseous, eating a few soda crackers can be helpful. Drinking liquids between meals instead of during meals also seems to help nausea and vomiting.  If you develop constipation, eat more high-fiber foods, such as fresh vegetables or fruit and whole grains. Drink enough fluids to keep your urine clear or pale yellow. Activity and Exercise  Exercise only as directed by your health care provider. Exercising will help you:  Control your weight.  Stay in shape.  Be prepared for labor and delivery.  Experiencing pain or cramping in the lower abdomen or low back is a good sign that you should stop exercising. Check with your health care provider before continuing normal exercises.  Try to avoid standing for long periods of time. Move your legs often if you must stand in one place for a long time.  Avoid heavy lifting.  Wear low-heeled shoes, and practice good posture.  You may continue to have sex unless your health care provider directs you  otherwise. Relief of Pain or Discomfort  Wear a good support bra for breast tenderness.   Take warm sitz baths to soothe any pain or discomfort caused by hemorrhoids. Use hemorrhoid cream if your health care provider approves.   Rest with your legs elevated if you have leg cramps or low back pain.  If you develop varicose veins in your legs, wear support hose. Elevate your feet for 15 minutes, 3-4 times a day. Limit salt in your diet. Prenatal Care  Schedule your prenatal visits by the twelfth week of pregnancy. They are usually scheduled monthly at first, then more often in the last 2 months before delivery.  Write down your questions. Take them to your prenatal visits.  Keep all your prenatal visits as directed by your health care provider. Safety  Wear your seat belt at all times when driving.  Make a list of emergency phone numbers, including numbers for family, friends, the hospital, and police and fire departments. General   Tips  Ask your health care provider for a referral to a local prenatal education class. Begin classes no later than at the beginning of month 6 of your pregnancy.  Ask for help if you have counseling or nutritional needs during pregnancy. Your health care provider can offer advice or refer you to specialists for help with various needs.  Do not use hot tubs, steam rooms, or saunas.  Do not douche or use tampons or scented sanitary pads.  Do not cross your legs for long periods of time.  Avoid cat litter boxes and soil used by cats. These carry germs that can cause birth defects in the baby and possibly loss of the fetus by miscarriage or stillbirth.  Avoid all smoking, herbs, alcohol, and medicines not prescribed by your health care provider. Chemicals in these affect the formation and growth of the baby.  Schedule a dentist appointment. At home, brush your teeth with a soft toothbrush and be gentle when you floss. SEEK MEDICAL CARE IF:   You have  dizziness.  You have mild pelvic cramps, pelvic pressure, or nagging pain in the abdominal area.  You have persistent nausea, vomiting, or diarrhea.  You have a bad smelling vaginal discharge.  You have pain with urination.  You notice increased swelling in your face, hands, legs, or ankles. SEEK IMMEDIATE MEDICAL CARE IF:   You have a fever.  You are leaking fluid from your vagina.  You have spotting or bleeding from your vagina.  You have severe abdominal cramping or pain.  You have rapid weight gain or loss.  You vomit blood or material that looks like coffee grounds.  You are exposed to German measles and have never had them.  You are exposed to fifth disease or chickenpox.  You develop a severe headache.  You have shortness of breath.  You have any kind of trauma, such as from a fall or a car accident. Document Released: 02/20/2001 Document Revised: 07/13/2013 Document Reviewed: 01/06/2013 [employer] Patient Information 2015 [employer]. This information is not intended to replace advice given to you by your health care provider. Make sure you discuss any questions you have with your health care provider.   Nausea & Vomiting  Have saltine crackers or pretzels by your bed and eat a few bites before you raise your head out of bed in the morning  Eat small frequent meals throughout the day instead of large meals  Drink plenty of fluids throughout the day to stay hydrated, just don't drink a lot of fluids with your meals.  This can make your stomach fill up faster making you feel sick  Do not brush your teeth right after you eat  Products with real ginger are good for nausea, like ginger ale and ginger hard candy Make sure it says made with real ginger!  Sucking on sour candy like lemon heads is also good for nausea  If your prenatal vitamins make you nauseated, take them at night so you will sleep through the nausea  Sea Bands  If you feel like you need  medicine for the nausea & vomiting please let us know  If you are unable to keep any fluids or food down please let us know   Constipation  Drink plenty of fluid, preferably water, throughout the day  Eat foods high in fiber such as fruits, vegetables, and grains  Exercise, such as walking, is a good way to keep your bowels regular  Drink warm fluids, especially warm   prune juice, or decaf coffee  Eat a 1/2 cup of real oatmeal (not instant), 1/2 cup applesauce, and 1/2-1 cup warm prune juice every day  If needed, you may take Colace (docusate sodium) stool softener once or twice a day to help keep the stool soft. If you are pregnant, wait until you are out of your first trimester (12-14 weeks of pregnancy)  If you still are having problems with constipation, you may take Miralax once daily as needed to help keep your bowels regular.  If you are pregnant, wait until you are out of your first trimester (12-14 weeks of pregnancy)  Safe Medications in Pregnancy   Acne: Benzoyl Peroxide Salicylic Acid  Backache/Headache: Tylenol: 2 regular strength every 4 hours OR              2 Extra strength every 6 hours  Colds/Coughs/Allergies: Benadryl (alcohol free) 25 mg every 6 hours as needed Breath right strips Claritin Cepacol throat lozenges Chloraseptic throat spray Cold-Eeze- up to three times per day Cough drops, alcohol free Flonase (by prescription only) Guaifenesin Mucinex Robitussin DM (plain only, alcohol free) Saline nasal spray/drops Sudafed (pseudoephedrine) & Actifed ** use only after [redacted] weeks gestation and if you do not have high blood pressure Tylenol Vicks Vaporub Zinc lozenges Zyrtec   Constipation: Colace Ducolax suppositories Fleet enema Glycerin suppositories Metamucil Milk of magnesia Miralax Senokot Smooth move tea  Diarrhea: Kaopectate Imodium A-D  *NO pepto Bismol  Hemorrhoids: Anusol Anusol HC Preparation  H Tucks  Indigestion: Tums Maalox Mylanta Zantac  Pepcid  Insomnia: Benadryl (alcohol free) 25mg every 6 hours as needed Tylenol PM Unisom, no Gelcaps  Leg Cramps: Tums MagGel  Nausea/Vomiting:  Bonine Dramamine Emetrol Ginger extract Sea bands Meclizine  Nausea medication to take during pregnancy:  Unisom (doxylamine succinate 25 mg tablets) Take one tablet daily at bedtime. If symptoms are not adequately controlled, the dose can be increased to a maximum recommended dose of two tablets daily (1/2 tablet in the morning, 1/2 tablet mid-afternoon and one at bedtime). Vitamin B6 100mg tablets. Take one tablet twice a day (up to 200 mg per day).  Skin Rashes: Aveeno products Benadryl cream or 25mg every 6 hours as needed Calamine Lotion 1% cortisone cream  Yeast infection: Gyne-lotrimin 7 Monistat 7   **If taking multiple medications, please check labels to avoid duplicating the same active ingredients **take medication as directed on the label ** Do not exceed 4000 mg of tylenol in 24 hours **Do not take medications that contain aspirin or ibuprofen      

## 2017-10-09 NOTE — Progress Notes (Signed)
US 13+2 wks,measurements c/w dates,normal ovaries bilat,NB present,NT 1.6 mm,anterior placenta,crl 72.84 mm

## 2017-10-10 LAB — OBSTETRIC PANEL, INCLUDING HIV
Antibody Screen: NEGATIVE
Basophils Absolute: 0 10*3/uL (ref 0.0–0.2)
Basos: 1 %
EOS (ABSOLUTE): 0.2 10*3/uL (ref 0.0–0.4)
EOS: 2 %
HEP B S AG: NEGATIVE
HIV Screen 4th Generation wRfx: NONREACTIVE
Hematocrit: 31.1 % — ABNORMAL LOW (ref 34.0–46.6)
Hemoglobin: 10.2 g/dL — ABNORMAL LOW (ref 11.1–15.9)
IMMATURE GRANS (ABS): 0.1 10*3/uL (ref 0.0–0.1)
IMMATURE GRANULOCYTES: 1 %
Lymphocytes Absolute: 2.5 10*3/uL (ref 0.7–3.1)
Lymphs: 31 %
MCH: 29.8 pg (ref 26.6–33.0)
MCHC: 32.8 g/dL (ref 31.5–35.7)
MCV: 91 fL (ref 79–97)
MONOS ABS: 0.5 10*3/uL (ref 0.1–0.9)
Monocytes: 7 %
NEUTROS ABS: 4.8 10*3/uL (ref 1.4–7.0)
Neutrophils: 58 %
PLATELETS: 309 10*3/uL (ref 150–450)
RBC: 3.42 x10E6/uL — AB (ref 3.77–5.28)
RDW: 12.4 % (ref 12.3–15.4)
RH TYPE: POSITIVE
RPR: NONREACTIVE
Rubella Antibodies, IGG: 1.91 index (ref >0.99)
WBC: 8.1 10*3/uL (ref 3.4–10.8)

## 2017-10-10 LAB — URINALYSIS, ROUTINE W REFLEX MICROSCOPIC
Bilirubin, UA: NEGATIVE
GLUCOSE, UA: NEGATIVE
KETONES UA: NEGATIVE
Leukocytes, UA: NEGATIVE
NITRITE UA: NEGATIVE
Protein, UA: NEGATIVE
RBC, UA: NEGATIVE
Specific Gravity, UA: 1.011 (ref 1.005–1.030)
UUROB: 0.2 mg/dL (ref 0.2–1.0)
pH, UA: 5.5 (ref 5.0–7.5)

## 2017-10-11 LAB — PMP SCREEN PROFILE (10S), URINE
AMPHETAMINE SCREEN URINE: NEGATIVE ng/mL
BARBITURATE SCREEN URINE: NEGATIVE ng/mL
BENZODIAZEPINE SCREEN, URINE: NEGATIVE ng/mL
CANNABINOIDS UR QL SCN: POSITIVE ng/mL — AB
CREATININE(CRT), U: 58.7 mg/dL (ref 20.0–300.0)
Cocaine (Metab) Scrn, Ur: NEGATIVE ng/mL
METHADONE SCREEN, URINE: NEGATIVE ng/mL
OXYCODONE+OXYMORPHONE UR QL SCN: NEGATIVE ng/mL
Opiate Scrn, Ur: NEGATIVE ng/mL
PHENCYCLIDINE QUANTITATIVE URINE: NEGATIVE ng/mL
PROPOXYPHENE SCREEN URINE: NEGATIVE ng/mL
Ph of Urine: 5.8 (ref 4.5–8.9)

## 2017-10-11 LAB — INTEGRATED 1
CROWN RUMP LENGTH MAT SCREEN: 72.8 mm
Gest. Age on Collection Date: 13.1 weeks
Maternal Age at EDD: 33.9 yr
Nuchal Translucency (NT): 1.6 mm
Number of Fetuses: 1
PAPP-A Value: 3275.7 ng/mL
WEIGHT: 158 [lb_av]

## 2017-10-11 LAB — GC/CHLAMYDIA PROBE AMP
Chlamydia trachomatis, NAA: NEGATIVE
Neisseria gonorrhoeae by PCR: NEGATIVE

## 2017-10-11 LAB — URINE CULTURE: ORGANISM ID, BACTERIA: NO GROWTH

## 2017-10-12 ENCOUNTER — Encounter: Payer: Self-pay | Admitting: Advanced Practice Midwife

## 2017-10-14 ENCOUNTER — Other Ambulatory Visit: Payer: Self-pay | Admitting: Women's Health

## 2017-10-14 MED ORDER — LABETALOL HCL 200 MG PO TABS: 200.0000 mg | ORAL_TABLET | Freq: Two times a day (BID) | ORAL | 3 refills | Status: DC

## 2017-10-24 ENCOUNTER — Other Ambulatory Visit: Payer: Self-pay | Admitting: Advanced Practice Midwife

## 2017-10-24 MED ORDER — SERTRALINE HCL 25 MG PO TABS: 25.0000 mg | ORAL_TABLET | Freq: Every day | ORAL | 6 refills | Status: DC

## 2017-10-24 NOTE — Progress Notes (Signed)
zoloft for depression per request.  Reevaluate next visit

## 2017-11-06 ENCOUNTER — Ambulatory Visit (INDEPENDENT_AMBULATORY_CARE_PROVIDER_SITE_OTHER): Payer: Medicaid Other | Admitting: Advanced Practice Midwife

## 2017-11-06 VITALS — BP 114/73 | HR 89 | Wt 163.0 lb

## 2017-11-06 DIAGNOSIS — Z331 Pregnant state, incidental: Secondary | ICD-10-CM

## 2017-11-06 DIAGNOSIS — Z363 Encounter for antenatal screening for malformations: Secondary | ICD-10-CM

## 2017-11-06 DIAGNOSIS — Z1389 Encounter for screening for other disorder: Secondary | ICD-10-CM

## 2017-11-06 DIAGNOSIS — I10 Essential (primary) hypertension: Secondary | ICD-10-CM

## 2017-11-06 DIAGNOSIS — O0992 Supervision of high risk pregnancy, unspecified, second trimester: Secondary | ICD-10-CM

## 2017-11-06 DIAGNOSIS — O10912 Unspecified pre-existing hypertension complicating pregnancy, second trimester: Secondary | ICD-10-CM

## 2017-11-06 DIAGNOSIS — Z3A17 17 weeks gestation of pregnancy: Secondary | ICD-10-CM

## 2017-11-06 LAB — POCT URINALYSIS DIPSTICK OB
Glucose, UA: NEGATIVE
Ketones, UA: NEGATIVE
LEUKOCYTES UA: NEGATIVE
NITRITE UA: NEGATIVE
POC,PROTEIN,UA: NEGATIVE
RBC UA: NEGATIVE

## 2017-11-06 NOTE — Patient Instructions (Signed)
Natasha Bean, I greatly value your feedback.  If you receive a survey following your visit with us today, we appreciate you taking the time to fill it out.  Thanks, Cathie BeamsFran Cresenzo-Dishmon, CNM     Second Trimester of Pregnancy The second trimester is from week 14 through week 27 (months 4 through 6). The second trimester is often a time when you feel your best. Your body has adjusted to being pregnant, and you begin to feel better physically. Usually, morning sickness has lessened or quit completely, you may have more energy, and you may have an increase in appetite. The second trimester is also a time when the fetus is growing rapidly. At the end of the sixth month, the fetus is about 9 inches long and weighs about 1 pounds. You will likely begin to feel the baby move (quickening) between 16 and 20 weeks of pregnancy. Body changes during your second trimester Your body continues to go through many changes during your second trimester. The changes vary from woman to woman.  Your weight will continue to increase. You will notice your lower abdomen bulging out.  You may begin to get stretch marks on your hips, abdomen, and breasts.  You may develop headaches that can be relieved by medicines. The medicines should be approved by your health care provider.  You may urinate more often because the fetus is pressing on your bladder.  You may develop or continue to have heartburn as a result of your pregnancy.  You may develop constipation because certain hormones are causing the muscles that push waste through your intestines to slow down.  You may develop hemorrhoids or swollen, bulging veins (varicose veins).  You may have back pain. This is caused by: ? Weight gain. ? Pregnancy hormones that are relaxing the joints in your pelvis. ? A shift in weight and the muscles that support your balance.  Your breasts will continue to grow and they will continue to become tender.  Your gums may  bleed and may be sensitive to brushing and flossing.  Dark spots or blotches (chloasma, mask of pregnancy) may develop on your face. This will likely fade after the baby is born.  A dark line from your belly button to the pubic area (linea nigra) may appear. This will likely fade after the baby is born.  You may have changes in your hair. These can include thickening of your hair, rapid growth, and changes in texture. Some women also have hair loss during or after pregnancy, or hair that feels dry or thin. Your hair will most likely return to normal after your baby is born.  What to expect at prenatal visits During a routine prenatal visit:  You will be weighed to make sure you and the fetus are growing normally.  Your blood pressure will be taken.  Your abdomen will be measured to track your baby's growth.  The fetal heartbeat will be listened to.  Any test results from the previous visit will be discussed.  Your health care provider may ask you:  How you are feeling.  If you are feeling the baby move.  If you have had any abnormal symptoms, such as leaking fluid, bleeding, severe headaches, or abdominal cramping.  If you are using any tobacco products, including cigarettes, chewing tobacco, and electronic cigarettes.  If you have any questions.  Other tests that may be performed during your second trimester include:  Blood tests that check for: ? Low iron levels (anemia). ?  High blood sugar that affects pregnant women (gestational diabetes) between 20 and 28 weeks. ? Rh antibodies. This is to check for a protein on red blood cells (Rh factor).  Urine tests to check for infections, diabetes, or protein in the urine.  An ultrasound to confirm the proper growth and development of the baby.  An amniocentesis to check for possible genetic problems.  Fetal screens for spina bifida and Down syndrome.  HIV (human immunodeficiency virus) testing. Routine prenatal testing  includes screening for HIV, unless you choose not to have this test.  Follow these instructions at home: Medicines  Follow your health care provider's instructions regarding medicine use. Specific medicines may be either safe or unsafe to take during pregnancy.  Take a prenatal vitamin that contains at least 600 micrograms (mcg) of folic acid.  If you develop constipation, try taking a stool softener if your health care provider approves. Eating and drinking  Eat a balanced diet that includes fresh fruits and vegetables, whole grains, good sources of protein such as meat, eggs, or tofu, and low-fat dairy. Your health care provider will help you determine the amount of weight gain that is right for you.  Avoid raw meat and uncooked cheese. These carry germs that can cause birth defects in the baby.  If you have low calcium intake from food, talk to your health care provider about whether you should take a daily calcium supplement.  Limit foods that are high in fat and processed sugars, such as fried and sweet foods.  To prevent constipation: ? Drink enough fluid to keep your urine clear or pale yellow. ? Eat foods that are high in fiber, such as fresh fruits and vegetables, whole grains, and beans. Activity  Exercise only as directed by your health care provider. Most women can continue their usual exercise routine during pregnancy. Try to exercise for 30 minutes at least 5 days a week. Stop exercising if you experience uterine contractions.  Avoid heavy lifting, wear low heel shoes, and practice good posture.  A sexual relationship may be continued unless your health care provider directs you otherwise. Relieving pain and discomfort  Wear a good support bra to prevent discomfort from breast tenderness.  Take warm sitz baths to soothe any pain or discomfort caused by hemorrhoids. Use hemorrhoid cream if your health care provider approves.  Rest with your legs elevated if you have  leg cramps or low back pain.  If you develop varicose veins, wear support hose. Elevate your feet for 15 minutes, 3-4 times a day. Limit salt in your diet. Prenatal Care  Write down your questions. Take them to your prenatal visits.  Keep all your prenatal visits as told by your health care provider. This is important. Safety  Wear your seat belt at all times when driving.  Make a list of emergency phone numbers, including numbers for family, friends, the hospital, and police and fire departments. General instructions  Ask your health care provider for a referral to a local prenatal education class. Begin classes no later than the beginning of month 6 of your pregnancy.  Ask for help if you have counseling or nutritional needs during pregnancy. Your health care provider can offer advice or refer you to specialists for help with various needs.  Do not use hot tubs, steam rooms, or saunas.  Do not douche or use tampons or scented sanitary pads.  Do not cross your legs for long periods of time.  Avoid cat litter boxes and  soil used by cats. These carry germs that can cause birth defects in the baby and possibly loss of the fetus by miscarriage or stillbirth.  Avoid all smoking, herbs, alcohol, and unprescribed drugs. Chemicals in these products can affect the formation and growth of the baby.  Do not use any products that contain nicotine or tobacco, such as cigarettes and e-cigarettes. If you need help quitting, ask your health care provider.  Visit your dentist if you have not gone yet during your pregnancy. Use a soft toothbrush to brush your teeth and be gentle when you floss. Contact a health care provider if:  You have dizziness.  You have mild pelvic cramps, pelvic pressure, or nagging pain in the abdominal area.  You have persistent nausea, vomiting, or diarrhea.  You have a bad smelling vaginal discharge.  You have pain when you urinate. Get help right away if:  You  have a fever.  You are leaking fluid from your vagina.  You have spotting or bleeding from your vagina.  You have severe abdominal cramping or pain.  You have rapid weight gain or weight loss.  You have shortness of breath with chest pain.  You notice sudden or extreme swelling of your face, hands, ankles, feet, or legs.  You have not felt your baby move in over an hour.  You have severe headaches that do not go away when you take medicine.  You have vision changes. Summary  The second trimester is from week 14 through week 27 (months 4 through 6). It is also a time when the fetus is growing rapidly.  Your body goes through many changes during pregnancy. The changes vary from woman to woman.  Avoid all smoking, herbs, alcohol, and unprescribed drugs. These chemicals affect the formation and growth your baby.  Do not use any tobacco products, such as cigarettes, chewing tobacco, and e-cigarettes. If you need help quitting, ask your health care provider.  Contact your health care provider if you have any questions. Keep all prenatal visits as told by your health care provider. This is important. This information is not intended to replace advice given to you by your health care provider. Make sure you discuss any questions you have with your health care provider.      CHILDBIRTH CLASSES [phone] is the phone number for Pregnancy Classes or hospital tours at [place] will be referred to  [email or web address] for more information on childbirth classes  At this site you may register for classes. You may sign up for a waiting list if classes are full. Please SIGN UP FOR THIS!.   When the waiting list becomes long, sometimes new classes can be added.

## 2017-11-06 NOTE — Progress Notes (Signed)
HIGH-RISK PREGNANCY VISIT Patient name: Natasha Bean MRN [id number]  Date of birth: 06/30/1985 Chief Complaint:   Routine Prenatal Visit  History of Present Illness:   Natasha Bean is a 32 y.o. 622P1001 female at 4041w2d with an Estimated Date of Delivery: 04/14/18 being seen today for ongoing management of a high-risk pregnancy complicated by chronic HTN. BPs were dropping too much, so decreased labetalol to 200mg  daily. pt checks BP at work in the evenings and it's still normal   Today she reports no complaints. Contractions: Not present. Vag. Bleeding: None.  Movement: Present. denies leaking of fluid.  Review of Systems:   Pertinent items are noted in HPI Denies abnormal vaginal discharge w/ itching/odor/irritation, headaches, visual changes, shortness of breath, chest pain, abdominal pain, severe nausea/vomiting, or problems with urination or bowel movements unless otherwise stated above.    Pertinent History Reviewed:  Medical & Surgical Hx:   Past Medical History:  Diagnosis Date  . Anemia   . Asthma   . Contraceptive management 06/10/2015  . Hematuria 01/06/2015  . History of marijuana use 11/01/2014   Repeat UDS- Neg (04/19/2015)  . Migraine   . Pregnancy induced hypertension   . Round ligament pain 01/06/2015  . Trichimoniasis 01/06/2015   Repeat neg- 01/17/15  . Urinary frequency 01/06/2015  . Vaginal discharge during pregnancy in second trimester 01/06/2015   Past Surgical History:  Procedure Laterality Date  . NO PAST SURGERIES     Family History  Problem Relation Age of Onset  . Cancer Mother        throat  . Hypertension Father   . Diabetes Father   . Cancer Father        colon  . Heart disease Father   . Thyroid disease Father   . Stroke Father   . Kidney failure Father   . Heart disease Paternal Grandmother   . Hypertension Paternal Grandmother   . Diabetes Paternal Grandmother   . Seizures Daughter     Current Outpatient Medications:  .   acetaminophen (TYLENOL) 500 MG tablet, Take 1,000 mg by mouth 2 (two) times daily as needed., Disp: , Rfl:  .  albuterol (PROVENTIL HFA;VENTOLIN HFA) 108 (90 Base) MCG/ACT inhaler, Inhale into the lungs every 6 (six) hours as needed for wheezing or shortness of breath., Disp: , Rfl:  .  aspirin EC 81 MG tablet, Take 2 tablets (162 mg total) by mouth daily., Disp: 60 tablet, Rfl: 6 .  labetalol (NORMODYNE) 200 MG tablet, Take 1 tablet (200 mg total) by mouth 2 (two) times daily., Disp: 60 tablet, Rfl: 3 .  Prenatal Vit-Fe Fumarate-FA (PRENATAL COMPLETE) 14-0.4 MG TABS, Take 1 tablet by mouth daily., Disp: 60 each, Rfl: 1 .  sertraline (ZOLOFT) 25 MG tablet, Take 1 tablet (25 mg total) by mouth daily., Disp: 30 tablet, Rfl: 6 .  Doxylamine-Pyridoxine (DICLEGIS) 10-10 MG TBEC, 2 tabs q hs, if sx persist add 1 tab q am on day 3, if sx persist add 1 tab q afternoon on day 4 (Patient not taking: Reported on 10/09/2017), Disp: 100 tablet, Rfl: 6 .  promethazine (PHENERGAN) 25 MG tablet, Take 1 tablet (25 mg total) by mouth every 8 (eight) hours as needed for nausea or vomiting. (Patient not taking: Reported on 10/09/2017), Disp: 10 tablet, Rfl: 0 Social History: Reviewed -  reports that she has been smoking cigarettes. She has a 3.50 pack-year smoking history. She has never used smokeless tobacco.   Physical Assessment:  Vitals:   11/06/17 0949  BP: 114/73  Pulse: 89  Weight: 163 lb (73.9 kg)  Body mass index is 29.81 kg/m.           Physical Examination:   General appearance: alert, well appearing, and in no distress  Mental status: alert, oriented to person, place, and time  Skin: warm & dry   Extremities: Edema: None    Cardiovascular: normal heart rate noted  Respiratory: normal respiratory effort, no distress  Abdomen: gravid, soft, non-tender  Pelvic: Cervical exam deferred         Fetal Status:     Movement: Present    Fetal Surveillance Testing today: doppler  Results for orders  placed or performed in visit on 11/06/17 (from the past 24 hour(s))  POC Urinalysis Dipstick OB   Collection Time: 11/06/17  9:55 AM  Result Value Ref Range   Color, UA     Clarity, UA     Glucose, UA Negative Negative   Bilirubin, UA     Ketones, UA neg    Spec Grav, UA     Blood, UA neg    pH, UA     POC Protein UA Negative Negative, Trace   Urobilinogen, UA     Nitrite, UA neg    Leukocytes, UA Negative Negative   Appearance     Odor      Assessment & Plan:  1) High-risk pregnancy G2P1001 at [redacted]w[redacted]d with an Estimated Date of Delivery: 04/14/18   2) CHTN, stable  3) ,    Medications: Continue Labetalol 200mg  daily; asa 162mg   Treatment Plan:  Growth [place] 20-28-34-38 Twice weekly testing >32 weeks  Follow-up: Return in about 2 weeks (around 11/20/2017) for HROB, ZO:XWRUEAV. 2nd IT  Orders Placed This Encounter  Procedures  . US OB Comp + 14 Wk  . POC Urinalysis Dipstick OB   Jacklyn Shell CNM 11/06/2017 1:19 PM

## 2017-11-22 ENCOUNTER — Encounter: Payer: Self-pay | Admitting: Obstetrics and Gynecology

## 2017-11-22 ENCOUNTER — Ambulatory Visit (INDEPENDENT_AMBULATORY_CARE_PROVIDER_SITE_OTHER): Payer: Medicaid Other | Admitting: Obstetrics and Gynecology

## 2017-11-22 ENCOUNTER — Telehealth: Payer: Self-pay | Admitting: *Deleted

## 2017-11-22 ENCOUNTER — Ambulatory Visit (INDEPENDENT_AMBULATORY_CARE_PROVIDER_SITE_OTHER): Payer: Medicaid Other

## 2017-11-22 VITALS — BP 119/66 | HR 80 | Wt 160.6 lb

## 2017-11-22 DIAGNOSIS — Z363 Encounter for antenatal screening for malformations: Secondary | ICD-10-CM

## 2017-11-22 DIAGNOSIS — O10912 Unspecified pre-existing hypertension complicating pregnancy, second trimester: Secondary | ICD-10-CM

## 2017-11-22 DIAGNOSIS — Z331 Pregnant state, incidental: Secondary | ICD-10-CM

## 2017-11-22 DIAGNOSIS — Z1379 Encounter for other screening for genetic and chromosomal anomalies: Secondary | ICD-10-CM | POA: Diagnosis not present

## 2017-11-22 DIAGNOSIS — Z3A19 19 weeks gestation of pregnancy: Secondary | ICD-10-CM

## 2017-11-22 DIAGNOSIS — O0990 Supervision of high risk pregnancy, unspecified, unspecified trimester: Secondary | ICD-10-CM

## 2017-11-22 DIAGNOSIS — O0992 Supervision of high risk pregnancy, unspecified, second trimester: Secondary | ICD-10-CM

## 2017-11-22 DIAGNOSIS — I10 Essential (primary) hypertension: Secondary | ICD-10-CM

## 2017-11-22 DIAGNOSIS — Z1389 Encounter for screening for other disorder: Secondary | ICD-10-CM

## 2017-11-22 LAB — POCT URINALYSIS DIPSTICK OB
Blood, UA: NEGATIVE
GLUCOSE, UA: NEGATIVE
KETONES UA: NEGATIVE
Leukocytes, UA: NEGATIVE
Nitrite, UA: NEGATIVE
POC,PROTEIN,UA: NEGATIVE

## 2017-11-22 MED ORDER — FERRALET 90 90-1 MG PO TABS: 1.0000 | ORAL_TABLET | Freq: Every day | ORAL | 3 refills | Status: DC

## 2017-11-22 NOTE — Progress Notes (Signed)
Patient ID: ZARINA PE, female   DOB: [birth year], 32 y.o.   MRN: [id number]    [facility] PREGNANCY VISIT Patient name: AZIZA STUCKERT MRN [id number]  Date of birth: 1986/01/17 Chief Complaint:   High Risk Gestation (ultrasound)  History of Present Illness:   Sharryn Belding Spivey is a 32 y.o. G47P1001 female at [redacted]w[redacted]d with an Estimated Date of Delivery: 04/14/18 being seen today for ongoing management of a high-risk pregnancy complicated by chronic HTN. anemia Was taking iron supplements but stopped after taking seeing nephrologist and being told HGB was improved Would like to get her tubes tied due to blood pressure flucuation. Today she reports no complaints.  .  .  Movement: Present. denies leaking of fluid.  Review of Systems:   Pertinent items are noted in HPI Denies abnormal vaginal discharge w/ itching/odor/irritation, headaches, visual changes, shortness of breath, chest pain, abdominal pain, severe nausea/vomiting, or problems with urination or bowel movements unless otherwise stated above. Pertinent History Reviewed:  Reviewed past medical,surgical, social, obstetrical and family history.  Reviewed problem list, medications and allergies. Physical Assessment:   Vitals:   11/22/17 1021  BP: 119/66  Pulse: 80  Weight: 160 lb 9.6 oz (72.8 kg)  Body mass index is 29.37 kg/m.           Physical Examination:   General appearance: alert, well appearing, and in no distress and oriented to person, place, and time  Mental status: alert, oriented to person, place, and time, normal mood, behavior, speech, dress, motor activity, and thought processes, affect appropriate to mood  Skin: warm & dry   Extremities: Edema: None    Cardiovascular: normal heart rate noted  Respiratory: normal respiratory effort, no distress  Abdomen: gravid, soft, non-tender  Pelvic: Cervical exam deferred         Fetal Status: Fetal Heart Rate (bpm): 138 u/s Fundal Height: 22 cm Movement: Present    Fetal  Surveillance Testing today: [place] 19+4 wks,cephalic,anterior pl gr 0,normal ovaries bilat,cx 4 cm,svp of fluid 5.1 cm,fhr 138 bpm,efw 307 g 50%,anatomy complete,no obvious abnormalities    Results for orders placed or performed in visit on 11/22/17 (from the past 24 hour(s))  POC Urinalysis Dipstick OB   Collection Time: 11/22/17 10:20 AM  Result Value Ref Range   Color, UA     Clarity, UA     Glucose, UA Negative Negative   Bilirubin, UA     Ketones, UA neg    Spec Grav, UA     Blood, UA neg    pH, UA     POC Protein UA Negative Negative, Trace   Urobilinogen, UA     Nitrite, UA neg    Leukocytes, UA Negative Negative   Appearance     Odor      Assessment & Plan:  1) High-risk pregnancy G2P1001 at [redacted]w[redacted]d with an Estimated Date of Delivery: 04/14/18   2) CHTN, stable 3. Anemia of pregnancy recurrent => Ferralet 90   Meds: No orders of the defined types were placed in this encounter.   Labs/procedures today: U/s, 2nd ITs  Treatment Plan:   Rx Ferralet once a day F/u 4 weeks HROB  Follow-up: Return in about 4 weeks (around 12/20/2017) for HROB.  Orders Placed This Encounter  Procedures  . INTEGRATED 2  . POC Urinalysis Dipstick OB   By signing my name below, I, Arnette Norris, attest that this documentation has been prepared under the direction and in the presence of Christin Bach  V, MD. Electronically Signed: Arnette NorrisMari Johnson Medical Scribe. 11/22/17. 10:35 AM.  I personally performed the services described in this documentation, which was SCRIBED in my presence. The recorded information has been reviewed and considered accurate. It has been edited as necessary during review. Tilda BurrowJohn V Shalea Tomczak, MD

## 2017-11-22 NOTE — Progress Notes (Signed)
US 19+4 wks,cephalic,anterior pl gr 0,normal ovaries bilat,cx 4 cm,svp of fluid 5.1 cm,fhr 138 bpm,efw 307 g 50%,anatomy complete,no obvious abnormalities

## 2017-11-22 NOTE — Telephone Encounter (Signed)
Ferrelet not covered by Medicaid. Will change to Ferrous Sulfate 325mg  BID per Dr Emelda FearFerguson.

## 2017-11-26 LAB — INTEGRATED 2
AFP MARKER: 89 ng/mL
AFP MoM: 1.73
CROWN RUMP LENGTH: 72.8 mm
DIA MOM: 1.73
DIA Value: 318.8 pg/mL
Estriol, Unconjugated: 1.66 ng/mL
GESTATIONAL AGE: 19.4 wk
Gest. Age on Collection Date: 13.1 weeks
HCG MOM: 1.28
MATERNAL AGE AT EDD: 33.9 a
NUCHAL TRANSLUCENCY (NT): 1.6 mm
NUCHAL TRANSLUCENCY MOM: 0.96
Number of Fetuses: 1
PAPP-A MOM: 3.02
PAPP-A Value: 3275.7 ng/mL
Test Results:: NEGATIVE
Weight: 158 [lb_av]
Weight: 158 [lb_av]
hCG Value: 28.3 IU/mL
uE3 MoM: 0.98

## 2017-12-20 ENCOUNTER — Encounter: Payer: Self-pay | Admitting: Obstetrics & Gynecology

## 2017-12-20 ENCOUNTER — Ambulatory Visit (INDEPENDENT_AMBULATORY_CARE_PROVIDER_SITE_OTHER): Payer: Medicaid Other | Admitting: Obstetrics & Gynecology

## 2017-12-20 ENCOUNTER — Other Ambulatory Visit: Payer: Self-pay

## 2017-12-20 VITALS — BP 126/88 | HR 74 | Wt 165.0 lb

## 2017-12-20 DIAGNOSIS — O99012 Anemia complicating pregnancy, second trimester: Secondary | ICD-10-CM | POA: Diagnosis not present

## 2017-12-20 DIAGNOSIS — Z3A23 23 weeks gestation of pregnancy: Secondary | ICD-10-CM

## 2017-12-20 DIAGNOSIS — O0992 Supervision of high risk pregnancy, unspecified, second trimester: Secondary | ICD-10-CM | POA: Diagnosis not present

## 2017-12-20 DIAGNOSIS — Z331 Pregnant state, incidental: Secondary | ICD-10-CM

## 2017-12-20 DIAGNOSIS — O10912 Unspecified pre-existing hypertension complicating pregnancy, second trimester: Secondary | ICD-10-CM

## 2017-12-20 DIAGNOSIS — Z1389 Encounter for screening for other disorder: Secondary | ICD-10-CM

## 2017-12-20 LAB — POCT URINALYSIS DIPSTICK OB
Blood, UA: NEGATIVE
Glucose, UA: NEGATIVE
Ketones, UA: NEGATIVE
Leukocytes, UA: NEGATIVE
Nitrite, UA: NEGATIVE

## 2017-12-20 LAB — POCT HEMOGLOBIN: Hemoglobin: 9.7 g/dL — AB (ref 12.2–16.2)

## 2017-12-20 MED ORDER — OMEPRAZOLE 20 MG PO CPDR
20.0000 mg | DELAYED_RELEASE_CAPSULE | Freq: Every day | ORAL | 6 refills | Status: DC
Start: 2017-12-20 — End: 2018-04-09

## 2017-12-20 MED ORDER — FERROUS SULFATE DRIED 200 (65 FE) MG PO TABS: 1.0000 | ORAL_TABLET | Freq: Two times a day (BID) | ORAL | 11 refills | Status: DC

## 2017-12-20 NOTE — Progress Notes (Signed)
HIGH-RISK PREGNANCY VISIT Patient name: Natasha Bean MRN [id number]  Date of birth: 1986-03-06 Chief Complaint:   High Risk Gestation  History of Present Illness:   Natasha Bean is a 32 y.o. G71P1001 female at [redacted]w[redacted]d with an Estimated Date of Delivery: 04/14/18 being seen today for ongoing management of a high-risk pregnancy complicated by chronic HTN.  Today she reports no complaints.  . Vag. Bleeding: None.  Movement: Present. denies leaking of fluid.  Review of Systems:   Pertinent items are noted in HPI Denies abnormal vaginal discharge w/ itching/odor/irritation, headaches, visual changes, shortness of breath, chest pain, abdominal pain, severe nausea/vomiting, or problems with urination or bowel movements unless otherwise stated above. Pertinent History Reviewed:  Reviewed past medical,surgical, social, obstetrical and family history.  Reviewed problem list, medications and allergies. Physical Assessment:   Vitals:   12/20/17 0959  BP: 126/88  Pulse: 74  Weight: 165 lb (74.8 kg)  Body mass index is 30.18 kg/m.           Physical Examination:   General appearance: alert, well appearing, and in no distress  Mental status: alert, oriented to person, place, and time  Skin: warm & dry   Extremities: Edema: None    Cardiovascular: normal heart rate noted  Respiratory: normal respiratory effort, no distress  Abdomen: gravid, soft, non-tender  Pelvic: Cervical exam deferred         Fetal Status:     Movement: Present    Fetal Surveillance Testing today: FHR   Results for orders placed or performed in visit on 12/20/17 (from the past 24 hour(s))  POC Urinalysis Dipstick OB   Collection Time: 12/20/17  9:58 AM  Result Value Ref Range   Color, UA     Clarity, UA     Glucose, UA Negative Negative   Bilirubin, UA     Ketones, UA neg    Spec Grav, UA     Blood, UA neg    pH, UA     POC Protein UA Trace Negative, Trace   Urobilinogen, UA     Nitrite, UA neg    Leukocytes, UA Negative Negative   Appearance     Odor    POCT hemoglobin   Collection Time: 12/20/17 10:01 AM  Result Value Ref Range   Hemoglobin 9.7 (A) 12.2 - 16.2 g/dL    Assessment & Plan:  1) High-risk pregnancy G2P1001 at [redacted]w[redacted]d with an Estimated Date of Delivery: 04/14/18   2) CHTN on labetalol, stable  3) Anemia, stable, begin iron therapy in addition to PNV  4) GERD, begin omeprazole  Meds:  Meds ordered this encounter  Medications  . Ferrous Sulfate Dried (FERROUS SULFATE IRON) 200 (65 Fe) MG TABS    Sig: Take 1 tablet by mouth 2 (two) times daily.    Dispense:  60 tablet    Refill:  11  . omeprazole (PRILOSEC) 20 MG capsule    Sig: Take 1 capsule (20 mg total) by mouth daily. 1 tablet a day    Dispense:  30 capsule    Refill:  6    Labs/procedures today:   Treatment Plan:  Sonogram 4 weeks for EFW  Reviewed: Preterm labor symptoms and general obstetric precautions including but not limited to vaginal bleeding, contractions, leaking of fluid and fetal movement were reviewed in detail with the patient.  All questions were answered.  Follow-up: Return in about 4 weeks (around 01/17/2018) for sonogram, HROB.  Orders Placed This Encounter  Procedures  . US OB Follow Up  . POC Urinalysis Dipstick OB  . POCT hemoglobin   Lazaro Arms  12/20/2017 10:27 AM

## 2017-12-21 ENCOUNTER — Other Ambulatory Visit: Payer: Self-pay

## 2017-12-21 ENCOUNTER — Encounter (HOSPITAL_COMMUNITY): Payer: Self-pay

## 2017-12-21 ENCOUNTER — Emergency Department (HOSPITAL_COMMUNITY)
Admission: EM | Admit: 2017-12-21 | Discharge: 2017-12-22 | Disposition: A | Discharge status: Home / Self Care | Payer: Medicaid Other | Attending: Emergency Medicine | Admitting: Emergency Medicine

## 2017-12-21 DIAGNOSIS — J029 Acute pharyngitis, unspecified: Secondary | ICD-10-CM | POA: Diagnosis not present

## 2017-12-21 DIAGNOSIS — Z7982 Long term (current) use of aspirin: Secondary | ICD-10-CM | POA: Insufficient documentation

## 2017-12-21 DIAGNOSIS — F17210 Nicotine dependence, cigarettes, uncomplicated: Secondary | ICD-10-CM | POA: Insufficient documentation

## 2017-12-21 DIAGNOSIS — I10 Essential (primary) hypertension: Secondary | ICD-10-CM | POA: Insufficient documentation

## 2017-12-21 DIAGNOSIS — J45909 Unspecified asthma, uncomplicated: Secondary | ICD-10-CM | POA: Diagnosis not present

## 2017-12-21 DIAGNOSIS — Z79899 Other long term (current) drug therapy: Secondary | ICD-10-CM | POA: Diagnosis not present

## 2017-12-21 DIAGNOSIS — O99512 Diseases of the respiratory system complicating pregnancy, second trimester: Secondary | ICD-10-CM | POA: Diagnosis not present

## 2017-12-21 DIAGNOSIS — R070 Pain in throat: Secondary | ICD-10-CM | POA: Diagnosis present

## 2017-12-21 DIAGNOSIS — Z3A23 23 weeks gestation of pregnancy: Secondary | ICD-10-CM | POA: Diagnosis not present

## 2017-12-21 NOTE — ED Triage Notes (Signed)
Pt c/o sore throat, headache, and ear pain that started approx 3 days ago. States painful swallowing, has tried robitussin/tylenol with no relief. Pt states she is [redacted] weeks pregnant and follows an OB regularly with her last check up on Oct 11th with no abnormalities.

## 2017-12-22 LAB — GROUP A STREP BY PCR: Group A Strep by PCR: NOT DETECTED

## 2017-12-22 MED ORDER — ACETAMINOPHEN 325 MG PO TABS
650.0000 mg | ORAL_TABLET | Freq: Once | ORAL | Status: AC
  Administered 2017-12-22: 650 mg via ORAL
  Filled 2017-12-22: qty 2

## 2017-12-22 MED ORDER — PENICILLIN G BENZATHINE 1200000 UNIT/2ML IM SUSP
1.2000 10*6.[IU] | Freq: Once | INTRAMUSCULAR | Status: AC
  Administered 2017-12-22: 1.2 10*6.[IU] via INTRAMUSCULAR
  Filled 2017-12-22: qty 2

## 2017-12-22 NOTE — Discharge Instructions (Addendum)
You were treated with penicillin.  Keep yourself hydrated.  Use Tylenol as needed for aches and fever.  Follow-up with your OB doctor.  Return to the ED with difficulty breathing, difficulty swallowing or other concerns.

## 2017-12-22 NOTE — ED Provider Notes (Signed)
[facility] EMERGENCY DEPARTMENT Provider Note   CSN: [id number] Arrival date & time: 12/21/17  2349     History   Chief Complaint Chief Complaint  Patient presents with  . Sore Throat    HPI Natasha Bean is a 32 y.o. female.  Patient presents with a 3-day history of sore throat and ear fullness and pain with swallowing.  She denies any fever, chills, nausea, vomiting.  No cough.  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  She is [redacted] weeks pregnant with her second pregnancy.  She saw her OB yesterday and had no problems with this pregnancy.  She does take chronic blood pressure medications but this is well controlled.  Denies any abdominal pain or vaginal bleeding.  States she is feeling baby move appropriately.  Did not receive a flu shot yet.  Denies any headache, body aches, neck pain, abdominal pain or back pain.  No cough or shortness of breath.  No chest pain.  The history is provided by the patient.  Sore Throat  Pertinent negatives include no chest pain, no abdominal pain, no headaches and no shortness of breath.    Past Medical History:  Diagnosis Date  . Anemia   . Asthma   . Contraceptive management 06/10/2015  . Hematuria 01/06/2015  . History of marijuana use 11/01/2014   Repeat UDS- Neg (04/19/2015)  . Migraine   . Pregnancy induced hypertension   . Round ligament pain 01/06/2015  . Trichimoniasis 01/06/2015   Repeat neg- 01/17/15  . Urinary frequency 01/06/2015  . Vaginal discharge during pregnancy in second trimester 01/06/2015    Patient Active Problem List   Diagnosis Date Noted  . Supervision of high risk pregnancy, antepartum 10/09/2017  . Smoker 10/09/2017  . Benign hypertension 10/09/2017  . Anemia affecting second pregnancy 02/14/2015    Past Surgical History:  Procedure Laterality Date  . NO PAST SURGERIES       OB History    Gravida  2   Para  1   Term  1   Preterm      AB      Living  1     SAB     TAB      Ectopic      Multiple  0   Live Births  1            Home Medications    Prior to Admission medications   Medication Sig Start Date End Date Taking? Authorizing Provider  acetaminophen (TYLENOL) 500 MG tablet Take 1,000 mg by mouth 2 (two) times daily as needed.    [provider]  albuterol (PROVENTIL HFA;VENTOLIN HFA) 108 (90 Base) MCG/ACT inhaler Inhale into the lungs every 6 (six) hours as needed for wheezing or shortness of breath.    [provider]  aspirin EC 81 MG tablet Take 2 tablets (162 mg total) by mouth daily. 10/09/17   Cresenzo-Dishmon, Scarlette Calico, CNM  Doxylamine-Pyridoxine (DICLEGIS) 10-10 MG TBEC 2 tabs q hs, if sx persist add 1 tab q am on day 3, if sx persist add 1 tab q afternoon on day 4 10/01/17   Shawna Clamp R, CNM  Fe Cbn-Fe Gluc-FA-B12-C-DSS (FERRALET 90) 90-1 MG TABS Take 1 tablet by mouth daily. 11/22/17 03/22/18  Tilda Burrow, MD  Ferrous Sulfate Dried (FERROUS SULFATE IRON) 200 (65 Fe) MG TABS Take 1 tablet by mouth 2 (two) times daily. 12/20/17   Lazaro Arms, MD  labetalol (NORMODYNE) 200 MG tablet Take 1 tablet (200 mg total) by mouth 2 (two) times daily. 10/14/17   Cheral Marker, CNM  omeprazole (PRILOSEC) 20 MG capsule Take 1 capsule (20 mg total) by mouth daily. 1 tablet a day 12/20/17   Lazaro Arms, MD  Prenatal Vit-Fe Fumarate-FA (PRENATAL COMPLETE) 14-0.4 MG TABS Take 1 tablet by mouth daily. 09/04/17   Zadie Rhine, MD  promethazine (PHENERGAN) 25 MG tablet Take 1 tablet (25 mg total) by mouth every 8 (eight) hours as needed for nausea or vomiting. Patient not taking: Reported on 12/20/2017 09/04/17   Zadie Rhine, MD  sertraline (ZOLOFT) 25 MG tablet Take 1 tablet (25 mg total) by mouth daily. 10/24/17   Jacklyn Shell, CNM    Family History Family History  Problem Relation Age of Onset  . Cancer Mother        throat  . Hypertension Father   . Diabetes Father   . Cancer Father         colon  . Heart disease Father   . Thyroid disease Father   . Stroke Father   . Kidney failure Father   . Heart disease Paternal Grandmother   . Hypertension Paternal Grandmother   . Diabetes Paternal Grandmother   . Seizures Daughter     Social History Social History   Tobacco Use  . Smoking status: Current Every Day Smoker    Packs/day: 0.25    Years: 14.00    Pack years: 3.50    Types: Cigarettes  . Smokeless tobacco: Never Used  . Tobacco comment: 2-3 per day  Substance Use Topics  . Alcohol use: Not Currently    Comment: wine sometimes  . Drug use: Not Currently    Types: Marijuana    Comment: last used 1 month ago     Allergies   Benadryl [diphenhydramine hcl] and Fioricet [butalbital-apap-caffeine]   Review of Systems Review of Systems  Constitutional: Negative for activity change, appetite change and fever.  HENT: Positive for congestion, ear pain, sore throat and trouble swallowing. Negative for facial swelling and rhinorrhea.   Respiratory: Negative for cough, chest tightness and shortness of breath.   Cardiovascular: Negative for chest pain.  Gastrointestinal: Negative for abdominal pain, nausea and vomiting.  Genitourinary: Negative for dysuria, hematuria, vaginal bleeding and vaginal discharge.  Musculoskeletal: Negative for arthralgias and myalgias.  Neurological: Negative for dizziness, weakness and headaches.    all other systems are negative except as noted in the HPI and PMH.    Physical Exam Updated Vital Signs BP 126/79 (BP Location: Right Arm)   Pulse 89   Temp 97.8 F (36.6 C) (Oral)   Resp 14   Ht 5\' 2"  (1.575 m)   Wt 74.8 kg   LMP 08/04/2017 (Approximate)   SpO2 100%   BMI 30.18 kg/m   Physical Exam  Constitutional: She is oriented to person, place, and time. She appears well-developed and well-nourished. No distress.  HENT:  Head: Normocephalic and atraumatic.  Mouth/Throat: Oropharynx is clear and moist. No oropharyngeal  exudate.  Oropharyngeal erythema.  Uvula midline, no asymmetry of soft palate  Eyes: Pupils are equal, round, and reactive to light. Conjunctivae and EOM are normal.  Neck: Normal range of motion. Neck supple.  No meningismus.  Cardiovascular: Normal rate, regular rhythm, normal heart sounds and intact distal pulses.  No murmur heard. Pulmonary/Chest: Effort normal and breath sounds normal. No respiratory distress.  Abdominal: Soft. There is no tenderness. There is no  rebound and no guarding.  Musculoskeletal: Normal range of motion. She exhibits no edema or tenderness.  Lymphadenopathy:    She has cervical adenopathy.  Neurological: She is alert and oriented to person, place, and time. No cranial nerve deficit. She exhibits normal muscle tone. Coordination normal.  No ataxia on finger to nose bilaterally. No pronator drift. 5/5 strength throughout. CN 2-12 intact.Equal grip strength. Sensation intact.   Skin: Skin is warm.  Psychiatric: She has a normal mood and affect. Her behavior is normal.  Nursing note and vitals reviewed.    ED Treatments / Results  Labs (all labs ordered are listed, but only abnormal results are displayed) Labs Reviewed  GROUP A STREP BY PCR    EKG None  Radiology No results found.  Procedures Procedures (including critical care time)  Medications Ordered in ED Medications  acetaminophen (TYLENOL) tablet 650 mg (has no administration in time range)     Initial Impression / Assessment and Plan / ED Course  I have reviewed the triage vital signs and the nursing notes.  Pertinent labs & imaging results that were available during my care of the patient were reviewed by me and considered in my medical decision making (see chart for details).     G2, P1 at [redacted] weeks gestation presenting with a 2-day history of sore throat and pain with swallowing.  No fever.  No abdominal pain vaginal bleeding or other pregnancy related complaints.  Rapid strep is  negative.  Patient is in no distress. Lungs are clear.  Ultrasound shows fetal movement present with fetal heart tones 150s to 160s.  She denies any pregnancy related complaints.  Reports worsening sore throat and pain with swallowing.  Discussed intermediate sensitivity of the rapid strep test and she requests empiric treatment which given her pregnancy does not seem unreasonable. 3/4 Centor criteria present.  Patient given IM Bicillin for pharyngitis.  She is tolerating p.o.  She is instructed on oral hydration at home, Tylenol as needed, OB follow-up.  Return precautions discussed.   EMERGENCY DEPARTMENT [place] PREGNANCY "Study: Limited Ultrasound of the Pelvis for Pregnancy"  INDICATIONS:Pregnancy(required) Multiple views of the uterus and pelvic cavity were obtained in real-time with a multi-frequency probe.  APPROACH:Transabdominal  PERFORMED BY: Myself IMAGES ARCHIVED?: Yes LIMITATIONS: Body habitus PREGNANCY FREE FLUID: None ADNEXAL FINDINGS:Left ovary not seen and Right ovary not seen GESTATIONAL AGE, ESTIMATE:    FETAL HEART RATE: 150-160s  INTERPRETATION: Fetal pole present and Fetal heart activity seen      Final Clinical Impressions(s) / ED Diagnoses   Final diagnoses:  Pharyngitis, unspecified etiology    ED Discharge Orders    None       Stephano Arrants, Jeannett Senior, MD 12/22/17 0225

## 2017-12-23 ENCOUNTER — Ambulatory Visit (INDEPENDENT_AMBULATORY_CARE_PROVIDER_SITE_OTHER): Payer: Medicaid Other | Admitting: Obstetrics and Gynecology

## 2017-12-23 ENCOUNTER — Encounter: Payer: Self-pay | Admitting: Obstetrics and Gynecology

## 2017-12-23 VITALS — BP 120/74 | HR 79 | Temp 98.4°F | Wt 164.4 lb

## 2017-12-23 DIAGNOSIS — O0992 Supervision of high risk pregnancy, unspecified, second trimester: Secondary | ICD-10-CM

## 2017-12-23 DIAGNOSIS — Z1389 Encounter for screening for other disorder: Secondary | ICD-10-CM

## 2017-12-23 DIAGNOSIS — Z720 Tobacco use: Secondary | ICD-10-CM

## 2017-12-23 DIAGNOSIS — J029 Acute pharyngitis, unspecified: Secondary | ICD-10-CM

## 2017-12-23 DIAGNOSIS — O9989 Other specified diseases and conditions complicating pregnancy, childbirth and the puerperium: Secondary | ICD-10-CM

## 2017-12-23 DIAGNOSIS — Z331 Pregnant state, incidental: Secondary | ICD-10-CM

## 2017-12-23 DIAGNOSIS — Z3A24 24 weeks gestation of pregnancy: Secondary | ICD-10-CM

## 2017-12-23 LAB — POCT URINALYSIS DIPSTICK OB
GLUCOSE, UA: NEGATIVE
Ketones, UA: NEGATIVE
LEUKOCYTES UA: NEGATIVE
Nitrite, UA: NEGATIVE
POC,PROTEIN,UA: NEGATIVE
RBC UA: NEGATIVE

## 2017-12-23 MED ORDER — BUTAMBEN-TETRACAINE-BENZOCAINE 2-2-14 % EX AERO: 1.0000 | INHALATION_SPRAY | CUTANEOUS | Status: AC | PRN

## 2017-12-23 NOTE — Patient Instructions (Signed)
.  pharyn Pharyngitis Pharyngitis is a sore throat (pharynx). There is redness, pain, and swelling of your throat. Follow these instructions at home:  Drink enough fluids to keep your pee (urine) clear or pale yellow.  Only take medicine as told by your doctor. ? You may get sick again if you do not take medicine as told. Finish your medicines, even if you start to feel better. ? Do not take aspirin.  Rest.  Rinse your mouth (gargle) with salt water ( tsp of salt per 1 qt of water) every 1-2 hours. This will help the pain.  If you are not at risk for choking, you can suck on hard candy or sore throat lozenges. Contact a doctor if:  You have large, tender lumps on your neck.  You have a rash.  You cough up green, yellow-brown, or bloody spit. Get help right away if:  You have a stiff neck.  You drool or cannot swallow liquids.  You throw up (vomit) or are not able to keep medicine or liquids down.  You have very bad pain that does not go away with medicine.  You have problems breathing (not from a stuffy nose). This information is not intended to replace advice given to you by your health care provider. Make sure you discuss any questions you have with your health care provider. Document Released: 08/15/2007 Document Revised: 08/04/2015 Document Reviewed: 11/03/2012 Elsevier Interactive Patient Education  2017 [employer].

## 2017-12-23 NOTE — Progress Notes (Signed)
Patient ID: RYLEA SELWAY, female   DOB: 1985/08/24, 32 y.o.   MRN: [id number]    [facility] Visit  @DATE @            Patient name: Natasha Bean MRN [id number]  Date of birth: [birth year]  CC & HPI:  Natasha Bean is a 32 y.o. female presenting today for f/u from ER for sore throat. Had had sore throat for 3 days when she went to ER.  ROS:  ROS +sore throat -fever -chills All systems are negative except as noted in the HPI and PMH.   Pertinent History Reviewed:   Reviewed: Medical         Past Medical History:  Diagnosis Date  . Anemia   . Asthma   . Contraceptive management 06/10/2015  . Hematuria 01/06/2015  . History of marijuana use 11/01/2014   Repeat UDS- Neg (04/19/2015)  . Migraine   . Pregnancy induced hypertension   . Round ligament pain 01/06/2015  . Trichimoniasis 01/06/2015   Repeat neg- 01/17/15  . Urinary frequency 01/06/2015  . Vaginal discharge during pregnancy in second trimester 01/06/2015                              Surgical Hx:    Past Surgical History:  Procedure Laterality Date  . NO PAST SURGERIES     Medications: Reviewed & Updated - see associated section                       Current Outpatient Medications:  .  acetaminophen (TYLENOL) 500 MG tablet, Take 1,000 mg by mouth 2 (two) times daily as needed., Disp: , Rfl:  .  aspirin EC 81 MG tablet, Take 2 tablets (162 mg total) by mouth daily., Disp: 60 tablet, Rfl: 6 .  Fe Cbn-Fe Gluc-FA-B12-C-DSS (FERRALET 90) 90-1 MG TABS, Take 1 tablet by mouth daily., Disp: 30 each, Rfl: 3 .  Ferrous Sulfate Dried (FERROUS SULFATE IRON) 200 (65 Fe) MG TABS, Take 1 tablet by mouth 2 (two) times daily., Disp: 60 tablet, Rfl: 11 .  labetalol (NORMODYNE) 200 MG tablet, Take 1 tablet (200 mg total) by mouth 2 (two) times daily., Disp: 60 tablet, Rfl: 3 .  omeprazole (PRILOSEC) 20 MG capsule, Take 1 capsule (20 mg total) by mouth daily. 1 tablet a day, Disp: 30 capsule, Rfl: 6 .  Prenatal Vit-Fe  Fumarate-FA (PRENATAL COMPLETE) 14-0.4 MG TABS, Take 1 tablet by mouth daily., Disp: 60 each, Rfl: 1 .  sertraline (ZOLOFT) 25 MG tablet, Take 1 tablet (25 mg total) by mouth daily., Disp: 30 tablet, Rfl: 6 .  albuterol (PROVENTIL HFA;VENTOLIN HFA) 108 (90 Base) MCG/ACT inhaler, Inhale into the lungs every 6 (six) hours as needed for wheezing or shortness of breath., Disp: , Rfl:  .  Doxylamine-Pyridoxine (DICLEGIS) 10-10 MG TBEC, 2 tabs q hs, if sx persist add 1 tab q am on day 3, if sx persist add 1 tab q afternoon on day 4 (Patient not taking: Reported on 12/23/2017), Disp: 100 tablet, Rfl: 6 .  promethazine (PHENERGAN) 25 MG tablet, Take 1 tablet (25 mg total) by mouth every 8 (eight) hours as needed for nausea or vomiting. (Patient not taking: Reported on 12/20/2017), Disp: 10 tablet, Rfl: 0   Social History: Reviewed -  reports that she has been smoking cigarettes. She has a 3.50 pack-year smoking history. She has never used smokeless  tobacco.  Objective Findings:  Vitals: Blood pressure 120/74, pulse 79, temperature 98.4 F (36.9 C), weight 164 lb 6.4 oz (74.6 kg), last menstrual period 08/04/2017.  PHYSICAL EXAMINATION General appearance - alert, well appearing, and in no distress and oriented to person, place, and time Mental status - alert, oriented to person, place, and time, normal mood, behavior, speech, dress, motor activity, and thought processes, affect appropriate to mood Throat: Lymph adenopathy bilaterally, no purulence.  Ears: TM right clear, TM left clear. No retraction no redness   PELVIC DOPPLER: 134 bpm, 24 cm  Assessment & Plan:   A:  1.  Sore throat, probably viral pharyngitis, mild 2. CHTN stable 3. Preg [redacted]w[redacted]d 4.   P:  Tx: Cetacaine spray. Chloraseptic OTC lozenges 1.  See back as scheduled for [facility] appt. 2.     By signing my name below, I, Arnette Norris, attest that this documentation has been prepared under the direction and in the presence of  Tilda Burrow, MD. Electronically Signed: Arnette Norris Medical Scribe. 12/23/17. 11:52 AM.  I personally performed the services described in this documentation, which was SCRIBED in my presence. The recorded information has been reviewed and considered accurate. It has been edited as necessary during review. Tilda Burrow, MD

## 2017-12-25 ENCOUNTER — Emergency Department (HOSPITAL_COMMUNITY)
Admission: EM | Admit: 2017-12-25 | Discharge: 2017-12-25 | Disposition: A | Discharge status: Home / Self Care | Payer: Medicaid Other | Attending: Emergency Medicine | Admitting: Emergency Medicine

## 2017-12-25 ENCOUNTER — Other Ambulatory Visit: Payer: Self-pay

## 2017-12-25 ENCOUNTER — Encounter (HOSPITAL_COMMUNITY): Payer: Self-pay | Admitting: Emergency Medicine

## 2017-12-25 DIAGNOSIS — K2100 Gastro-esophageal reflux disease with esophagitis, without bleeding: Secondary | ICD-10-CM

## 2017-12-25 DIAGNOSIS — Z7982 Long term (current) use of aspirin: Secondary | ICD-10-CM | POA: Diagnosis not present

## 2017-12-25 DIAGNOSIS — F1290 Cannabis use, unspecified, uncomplicated: Secondary | ICD-10-CM | POA: Diagnosis not present

## 2017-12-25 DIAGNOSIS — F17210 Nicotine dependence, cigarettes, uncomplicated: Secondary | ICD-10-CM | POA: Insufficient documentation

## 2017-12-25 DIAGNOSIS — I10 Essential (primary) hypertension: Secondary | ICD-10-CM | POA: Diagnosis not present

## 2017-12-25 DIAGNOSIS — J45909 Unspecified asthma, uncomplicated: Secondary | ICD-10-CM | POA: Insufficient documentation

## 2017-12-25 DIAGNOSIS — Z79899 Other long term (current) drug therapy: Secondary | ICD-10-CM | POA: Diagnosis not present

## 2017-12-25 DIAGNOSIS — J029 Acute pharyngitis, unspecified: Secondary | ICD-10-CM | POA: Diagnosis present

## 2017-12-25 LAB — BASIC METABOLIC PANEL
Anion gap: 7 (ref 5–15)
BUN: 9 mg/dL (ref 6–20)
CO2: 21 mmol/L — ABNORMAL LOW (ref 22–32)
CREATININE: 0.71 mg/dL (ref 0.44–1.00)
Calcium: 8.1 mg/dL — ABNORMAL LOW (ref 8.9–10.3)
Chloride: 108 mmol/L (ref 98–111)
Glucose, Bld: 79 mg/dL (ref 70–99)
Potassium: 3.8 mmol/L (ref 3.5–5.1)
SODIUM: 136 mmol/L (ref 135–145)

## 2017-12-25 LAB — CBC
HCT: 29.7 % — ABNORMAL LOW (ref 36.0–46.0)
Hemoglobin: 9.5 g/dL — ABNORMAL LOW (ref 12.0–15.0)
MCH: 30.7 pg (ref 26.0–34.0)
MCHC: 32 g/dL (ref 30.0–36.0)
MCV: 96.1 fL (ref 80.0–100.0)
Platelets: 261 10*3/uL (ref 150–400)
RBC: 3.09 MIL/uL — AB (ref 3.87–5.11)
RDW: 12.9 % (ref 11.5–15.5)
WBC: 9.4 10*3/uL (ref 4.0–10.5)
nRBC: 0 % (ref 0.0–0.2)

## 2017-12-25 MED ORDER — OMEPRAZOLE 2 MG/ML ORAL SUSPENSION: 20.0000 mg | Freq: Every day | ORAL | Status: DC

## 2017-12-25 MED ORDER — ONDANSETRON HCL 4 MG/2ML IJ SOLN
4.0000 mg | Freq: Once | INTRAMUSCULAR | Status: AC
  Administered 2017-12-25: 4 mg via INTRAVENOUS
  Filled 2017-12-25: qty 2

## 2017-12-25 MED ORDER — PANTOPRAZOLE SODIUM 40 MG PO PACK: 40.0000 mg | PACK | Freq: Every day | ORAL | 0 refills | Status: DC

## 2017-12-25 MED ORDER — PANTOPRAZOLE SODIUM 40 MG PO PACK
40.0000 mg | PACK | Freq: Every day | ORAL | Status: DC
  Administered 2017-12-25: 40 mg
  Filled 2017-12-25 (×4): qty 20

## 2017-12-25 MED ORDER — SODIUM CHLORIDE 0.9 % IV BOLUS
1000.0000 mL | Freq: Once | INTRAVENOUS | Status: AC
  Administered 2017-12-25: 1000 mL via INTRAVENOUS

## 2017-12-25 NOTE — ED Triage Notes (Signed)
Pt reports ongoing sore throat. Pt diagnosed with strep throat on Saturday and given an injection of penicillin. States that symptoms have worsened. Difficulty swallowing solid foods. Can tolerate liquids. Pt is [redacted] weeks pregnant. She is a patient of [facility]. EDD 05/04/2018.

## 2017-12-25 NOTE — Discharge Instructions (Signed)
Take the medications as prescribed, you can take over-the-counter Tylenol as needed for discomfort, follow-up with your doctor if the symptoms are not improving by the end of this week

## 2017-12-25 NOTE — ED Provider Notes (Signed)
[facility] EMERGENCY DEPARTMENT Provider Note   CSN: [id number] Arrival date & time: 12/25/17  1449     History   Chief Complaint Chief Complaint  Patient presents with  . Sore Throat    HPI Natasha Bean is a 32 y.o. female.  HPI Presents to the emergency room for persistent throat irritation.  Patient states she was seen in the emergency room on October 12.  She was diagnosed with strep throat and given a dose of antibiotics.  Patient states she continues to have throat discomfort although the pain has decreased she feels like she is having difficulty swallowing pointing towards her tracheal cartilage.  Patient states solid foods seem to get hung up.  She can tolerate liquids.  She denies any fevers.  No difficulty speaking.  Patient is [redacted] weeks pregnant but denies any complications.  She is not having any abdominal pain, vaginal bleeding or leakage of fluid. Past Medical History:  Diagnosis Date  . Anemia   . Asthma   . Contraceptive management 06/10/2015  . Hematuria 01/06/2015  . History of marijuana use 11/01/2014   Repeat UDS- Neg (04/19/2015)  . Migraine   . Pregnancy induced hypertension   . Round ligament pain 01/06/2015  . Trichimoniasis 01/06/2015   Repeat neg- 01/17/15  . Urinary frequency 01/06/2015  . Vaginal discharge during pregnancy in second trimester 01/06/2015    Patient Active Problem List   Diagnosis Date Noted  . Supervision of high risk pregnancy, antepartum 10/09/2017  . Smoker 10/09/2017  . Benign hypertension 10/09/2017  . Anemia affecting second pregnancy 02/14/2015    Past Surgical History:  Procedure Laterality Date  . NO PAST SURGERIES       OB History    Gravida  2   Para  1   Term  1   Preterm      AB      Living  1     SAB      TAB      Ectopic      Multiple  0   Live Births  1            Home Medications    Prior to Admission medications   Medication Sig Start Date End Date Taking? Authorizing  Provider  acetaminophen (TYLENOL) 500 MG tablet Take 1,000 mg by mouth 2 (two) times daily as needed.    [provider]  albuterol (PROVENTIL HFA;VENTOLIN HFA) 108 (90 Base) MCG/ACT inhaler Inhale into the lungs every 6 (six) hours as needed for wheezing or shortness of breath.    [provider]  aspirin EC 81 MG tablet Take 2 tablets (162 mg total) by mouth daily. 10/09/17   Cresenzo-Dishmon, Scarlette Calico, CNM  Doxylamine-Pyridoxine (DICLEGIS) 10-10 MG TBEC 2 tabs q hs, if sx persist add 1 tab q am on day 3, if sx persist add 1 tab q afternoon on day 4 Patient not taking: Reported on 12/23/2017 10/01/17   Cheral Marker, CNM  Fe Cbn-Fe Gluc-FA-B12-C-DSS (FERRALET 90) 90-1 MG TABS Take 1 tablet by mouth daily. 11/22/17 03/22/18  Tilda Burrow, MD  Ferrous Sulfate Dried (FERROUS SULFATE IRON) 200 (65 Fe) MG TABS Take 1 tablet by mouth 2 (two) times daily. 12/20/17   Lazaro Arms, MD  labetalol (NORMODYNE) 200 MG tablet Take 1 tablet (200 mg total) by mouth 2 (two) times daily. 10/14/17   Cheral Marker, CNM  omeprazole (PRILOSEC) 20 MG capsule Take 1 capsule (20 mg total)  by mouth daily. 1 tablet a day 12/20/17   Lazaro Arms, MD  pantoprazole sodium (PROTONIX) 40 mg/20 mL PACK Take 20 mLs (40 mg total) by mouth daily. 12/25/17   Linwood Dibbles, MD  Prenatal Vit-Fe Fumarate-FA (PRENATAL COMPLETE) 14-0.4 MG TABS Take 1 tablet by mouth daily. 09/04/17   Zadie Rhine, MD  promethazine (PHENERGAN) 25 MG tablet Take 1 tablet (25 mg total) by mouth every 8 (eight) hours as needed for nausea or vomiting. Patient not taking: Reported on 12/20/2017 09/04/17   Zadie Rhine, MD  sertraline (ZOLOFT) 25 MG tablet Take 1 tablet (25 mg total) by mouth daily. 10/24/17   Jacklyn Shell, CNM    Family History Family History  Problem Relation Age of Onset  . Cancer Mother        throat  . Hypertension Father   . Diabetes Father   . Cancer Father        colon  . Heart disease  Father   . Thyroid disease Father   . Stroke Father   . Kidney failure Father   . Heart disease Paternal Grandmother   . Hypertension Paternal Grandmother   . Diabetes Paternal Grandmother   . Seizures Daughter     Social History Social History   Tobacco Use  . Smoking status: Current Every Day Smoker    Packs/day: 0.25    Years: 14.00    Pack years: 3.50    Types: Cigarettes  . Smokeless tobacco: Never Used  . Tobacco comment: 2-3 per day  Substance Use Topics  . Alcohol use: Not Currently    Comment: wine sometimes  . Drug use: Not Currently    Types: Marijuana    Comment: last used 1 month ago     Allergies   Benadryl [diphenhydramine hcl] and Fioricet [butalbital-apap-caffeine]   Review of Systems Review of Systems  All other systems reviewed and are negative.    Physical Exam Updated Vital Signs BP (!) 114/58 (BP Location: Left Arm)   Pulse 77   Temp 97.9 F (36.6 C) (Oral)   Resp 14   Ht 1.575 m (5\' 2" )   Wt 74.8 kg   LMP 08/04/2017 (Approximate)   SpO2 98%   BMI 30.18 kg/m   Physical Exam  Constitutional: She appears well-developed and well-nourished. No distress.  HENT:  Head: Normocephalic and atraumatic.  Right Ear: External ear normal. No swelling.  Left Ear: External ear normal. No swelling.  Mouth/Throat: Uvula is midline and oropharynx is clear and moist. No oral lesions. No uvula swelling. No oropharyngeal exudate, posterior oropharyngeal edema, posterior oropharyngeal erythema or tonsillar abscesses.  Eyes: Conjunctivae are normal. Right eye exhibits no discharge. Left eye exhibits no discharge. No scleral icterus.  Neck: Neck supple. No tracheal deviation present.  Cardiovascular: Normal rate, regular rhythm and intact distal pulses.  Pulmonary/Chest: Effort normal and breath sounds normal. No stridor. No respiratory distress. She has no wheezes. She has no rales.  Abdominal: Soft. Bowel sounds are normal. She exhibits no distension.  There is no tenderness. There is no rebound and no guarding.  Gravid uterus  Musculoskeletal: She exhibits no edema or tenderness.  Lymphadenopathy:    She has no cervical adenopathy.  Neurological: She is alert. She has normal strength. No cranial nerve deficit (no facial droop, extraocular movements intact, no slurred speech) or sensory deficit. She exhibits normal muscle tone. She displays no seizure activity. Coordination normal.  Skin: Skin is warm and dry. No rash noted.  Psychiatric:  She has a normal mood and affect.  Nursing note and vitals reviewed.    ED Treatments / Results  Labs (all labs ordered are listed, but only abnormal results are displayed) Labs Reviewed  CBC - Abnormal; Notable for the following components:      Result Value   RBC 3.09 (*)    Hemoglobin 9.5 (*)    HCT 29.7 (*)    All other components within normal limits  BASIC METABOLIC PANEL - Abnormal; Notable for the following components:   CO2 21 (*)    Calcium 8.1 (*)    All other components within normal limits     Procedures Procedures (including critical care time)  Medications Ordered in ED Medications  pantoprazole sodium (PROTONIX) 40 mg/20 mL oral suspension 40 mg (40 mg Per Tube Given 12/25/17 1628)  sodium chloride 0.9 % bolus 1,000 mL (1,000 mLs Intravenous New Bag/Given 12/25/17 1611)  ondansetron (ZOFRAN) injection 4 mg (4 mg Intravenous Given 12/25/17 1611)     Initial Impression / Assessment and Plan / ED Course  I have reviewed the triage vital signs and the nursing notes.  Pertinent labs & imaging results that were available during my care of the patient were reviewed by me and considered in my medical decision making (see chart for details).  Clinical Course as of Dec 26 1718  Wed Dec 25, 2017  1659 CBC reviewed.  Anemia stable   [JK]    Clinical Course User Index [JK] Linwood Dibbles, MD    Presented to the emergency room with complaints of throat discomfort.  Her symptoms  are more suggestive of dysphasia at this time.  Patient does not have any trouble with her secretions.  She is able to tolerate liquids.  On exam she does not have any evidence of persistent pharyngitis or peritonsillar abscess.  I will have the patient start on some antacids.  I will have her follow-up with her primary care or GYN doctor.  At This time she does not appear to be having any issues with her pregnancy. Final Clinical Impressions(s) / ED Diagnoses   Final diagnoses:  Gastroesophageal reflux disease with esophagitis    ED Discharge Orders         Ordered    pantoprazole sodium (PROTONIX) 40 mg/20 mL PACK  Daily     12/25/17 1719           Linwood Dibbles, MD 12/25/17 1721

## 2017-12-25 NOTE — ED Notes (Signed)
Have notified pharmacy to change medication to Protonix

## 2017-12-28 DIAGNOSIS — J020 Streptococcal pharyngitis: Secondary | ICD-10-CM | POA: Diagnosis not present

## 2017-12-28 DIAGNOSIS — Z87891 Personal history of nicotine dependence: Secondary | ICD-10-CM | POA: Diagnosis not present

## 2017-12-28 DIAGNOSIS — K219 Gastro-esophageal reflux disease without esophagitis: Secondary | ICD-10-CM | POA: Diagnosis not present

## 2017-12-28 DIAGNOSIS — I10 Essential (primary) hypertension: Secondary | ICD-10-CM | POA: Diagnosis not present

## 2017-12-28 DIAGNOSIS — Z888 Allergy status to other drugs, medicaments and biological substances status: Secondary | ICD-10-CM | POA: Diagnosis not present

## 2017-12-28 DIAGNOSIS — B3781 Candidal esophagitis: Secondary | ICD-10-CM | POA: Diagnosis not present

## 2017-12-28 DIAGNOSIS — J45909 Unspecified asthma, uncomplicated: Secondary | ICD-10-CM | POA: Diagnosis not present

## 2018-01-17 ENCOUNTER — Other Ambulatory Visit: Payer: Self-pay

## 2018-01-17 ENCOUNTER — Ambulatory Visit (INDEPENDENT_AMBULATORY_CARE_PROVIDER_SITE_OTHER): Payer: Medicaid Other

## 2018-01-17 ENCOUNTER — Other Ambulatory Visit: Payer: Medicaid Other

## 2018-01-17 ENCOUNTER — Ambulatory Visit (INDEPENDENT_AMBULATORY_CARE_PROVIDER_SITE_OTHER): Payer: Medicaid Other | Admitting: Women's Health

## 2018-01-17 ENCOUNTER — Encounter: Payer: Self-pay | Admitting: Women's Health

## 2018-01-17 VITALS — BP 124/71 | HR 90 | Wt 167.0 lb

## 2018-01-17 DIAGNOSIS — O0990 Supervision of high risk pregnancy, unspecified, unspecified trimester: Secondary | ICD-10-CM

## 2018-01-17 DIAGNOSIS — O10919 Unspecified pre-existing hypertension complicating pregnancy, unspecified trimester: Secondary | ICD-10-CM | POA: Diagnosis not present

## 2018-01-17 DIAGNOSIS — O0992 Supervision of high risk pregnancy, unspecified, second trimester: Secondary | ICD-10-CM

## 2018-01-17 DIAGNOSIS — Z23 Encounter for immunization: Secondary | ICD-10-CM

## 2018-01-17 DIAGNOSIS — O10912 Unspecified pre-existing hypertension complicating pregnancy, second trimester: Secondary | ICD-10-CM | POA: Diagnosis not present

## 2018-01-17 DIAGNOSIS — Z331 Pregnant state, incidental: Secondary | ICD-10-CM

## 2018-01-17 DIAGNOSIS — Z1389 Encounter for screening for other disorder: Secondary | ICD-10-CM

## 2018-01-17 DIAGNOSIS — I10 Essential (primary) hypertension: Secondary | ICD-10-CM

## 2018-01-17 DIAGNOSIS — O09299 Supervision of pregnancy with other poor reproductive or obstetric history, unspecified trimester: Secondary | ICD-10-CM | POA: Insufficient documentation

## 2018-01-17 DIAGNOSIS — Z3A27 27 weeks gestation of pregnancy: Secondary | ICD-10-CM

## 2018-01-17 LAB — POCT URINALYSIS DIPSTICK OB
GLUCOSE, UA: NEGATIVE
Ketones, UA: NEGATIVE
LEUKOCYTES UA: NEGATIVE
NITRITE UA: NEGATIVE
POC,PROTEIN,UA: NEGATIVE
RBC UA: NEGATIVE

## 2018-01-17 NOTE — Patient Instructions (Addendum)
Your baby's weight is right where it should be on your ultrasound today.  Your baby weighs 1079 grams, which is 2.4 pounds, and that is considered in the 34% of all babies at this time in pregnancy. Anything less than 10% is considered 'small', so your baby is well above this right now, and has been gaining weight the entire pregnancy just like she should. We will continue to do ultrasounds during the pregnancy to check the weight to make sure the growth continues to remain normal.    Call the office [phone]) or go to [facility] if:  You begin to have strong, frequent contractions  Your water breaks.  Sometimes it is a big gush of fluid, sometimes it is just a trickle that keeps getting your panties wet or running down your legs  You have vaginal bleeding.  It is normal to have a small amount of spotting if your cervix was checked.   You don't feel your baby moving like normal.  If you don't, get you something to eat and drink and lay down and focus on feeling your baby move.  You should feel at least 10 movements in 2 hours.  If you don't, you should call the office or go to [facility].    Tdap Vaccine  It is recommended that you get the Tdap vaccine during the third trimester of EACH pregnancy to help protect your baby from getting pertussis (whooping cough)  27-36 weeks is the BEST time to do this so that you can pass the protection on to your baby. During pregnancy is better than after pregnancy, but if you are unable to get it during pregnancy it will be offered at the hospital.   You can get this vaccine with [place], at the health department, your family doctor, or some local pharmacies  Everyone who will be around your baby should also be up-to-date on their vaccines before the baby comes. Adults (who are not pregnant) only need 1 dose of Tdap during adulthood.   Third Trimester of Pregnancy The third trimester is from week 29 through week 42, months 7 through 9. The third  trimester is a time when the fetus is growing rapidly. At the end of the ninth month, the fetus is about 20 inches in length and weighs 6-10 pounds.  BODY CHANGES Your body goes through many changes during pregnancy. The changes vary from woman to woman.   Your weight will continue to increase. You can expect to gain 25-35 pounds (11-16 kg) by the end of the pregnancy.  You may begin to get stretch marks on your hips, abdomen, and breasts.  You may urinate more often because the fetus is moving lower into your pelvis and pressing on your bladder.  You may develop or continue to have heartburn as a result of your pregnancy.  You may develop constipation because certain hormones are causing the muscles that push waste through your intestines to slow down.  You may develop hemorrhoids or swollen, bulging veins (varicose veins).  You may have pelvic pain because of the weight gain and pregnancy hormones relaxing your joints between the bones in your pelvis. Backaches may result from overexertion of the muscles supporting your posture.  You may have changes in your hair. These can include thickening of your hair, rapid growth, and changes in texture. Some women also have hair loss during or after pregnancy, or hair that feels dry or thin. Your hair will most likely return to normal after  your baby is born.  Your breasts will continue to grow and be tender. A yellow discharge may leak from your breasts called colostrum.  Your belly button may stick out.  You may feel short of breath because of your expanding uterus.  You may notice the fetus "dropping," or moving lower in your abdomen.  You may have a bloody mucus discharge. This usually occurs a few days to a week before labor begins.  Your cervix becomes thin and soft (effaced) near your due date. WHAT TO EXPECT AT YOUR PRENATAL EXAMS  You will have prenatal exams every 2 weeks until week 36. Then, you will have weekly prenatal exams.  During a routine prenatal visit:  You will be weighed to make sure you and the fetus are growing normally.  Your blood pressure is taken.  Your abdomen will be measured to track your baby's growth.  The fetal heartbeat will be listened to.  Any test results from the previous visit will be discussed.  You may have a cervical check near your due date to see if you have effaced. At around 36 weeks, your caregiver will check your cervix. At the same time, your caregiver will also perform a test on the secretions of the vaginal tissue. This test is to determine if a type of bacteria, Group B streptococcus, is present. Your caregiver will explain this further. Your caregiver may ask you:  What your birth plan is.  How you are feeling.  If you are feeling the baby move.  If you have had any abnormal symptoms, such as leaking fluid, bleeding, severe headaches, or abdominal cramping.  If you have any questions. Other tests or screenings that may be performed during your third trimester include:  Blood tests that check for low iron levels (anemia).  Fetal testing to check the health, activity level, and growth of the fetus. Testing is done if you have certain medical conditions or if there are problems during the pregnancy. FALSE LABOR You may feel small, irregular contractions that eventually go away. These are called Braxton Hicks contractions, or false labor. Contractions may last for hours, days, or even weeks before true labor sets in. If contractions come at regular intervals, intensify, or become painful, it is best to be seen by your caregiver.  SIGNS OF LABOR   Menstrual-like cramps.  Contractions that are 5 minutes apart or less.  Contractions that start on the top of the uterus and spread down to the lower abdomen and back.  A sense of increased pelvic pressure or back pain.  A watery or bloody mucus discharge that comes from the vagina. If you have any of these signs  before the 37th week of pregnancy, call your caregiver right away. You need to go to the hospital to get checked immediately. HOME CARE INSTRUCTIONS   Avoid all smoking, herbs, alcohol, and unprescribed drugs. These chemicals affect the formation and growth of the baby.  Follow your caregiver's instructions regarding medicine use. There are medicines that are either safe or unsafe to take during pregnancy.  Exercise only as directed by your caregiver. Experiencing uterine cramps is a good sign to stop exercising.  Continue to eat regular, healthy meals.  Wear a good support bra for breast tenderness.  Do not use hot tubs, steam rooms, or saunas.  Wear your seat belt at all times when driving.  Avoid raw meat, uncooked cheese, cat litter boxes, and soil used by cats. These carry germs that can cause birth  defects in the baby.  Take your prenatal vitamins.  Try taking a stool softener (if your caregiver approves) if you develop constipation. Eat more high-fiber foods, such as fresh vegetables or fruit and whole grains. Drink plenty of fluids to keep your urine clear or pale yellow.  Take warm sitz baths to soothe any pain or discomfort caused by hemorrhoids. Use hemorrhoid cream if your caregiver approves.  If you develop varicose veins, wear support hose. Elevate your feet for 15 minutes, 3-4 times a day. Limit salt in your diet.  Avoid heavy lifting, wear low heal shoes, and practice good posture.  Rest a lot with your legs elevated if you have leg cramps or low back pain.  Visit your dentist if you have not gone during your pregnancy. Use a soft toothbrush to brush your teeth and be gentle when you floss.  A sexual relationship may be continued unless your caregiver directs you otherwise.  Do not travel far distances unless it is absolutely necessary and only with the approval of your caregiver.  Take prenatal classes to understand, practice, and ask questions about the labor  and delivery.  Make a trial run to the hospital.  Pack your hospital bag.  Prepare the baby's nursery.  Continue to go to all your prenatal visits as directed by your caregiver. SEEK MEDICAL CARE IF:  You are unsure if you are in labor or if your water has broken.  You have dizziness.  You have mild pelvic cramps, pelvic pressure, or nagging pain in your abdominal area.  You have persistent nausea, vomiting, or diarrhea.  You have a bad smelling vaginal discharge.  You have pain with urination. SEEK IMMEDIATE MEDICAL CARE IF:   You have a fever.  You are leaking fluid from your vagina.  You have spotting or bleeding from your vagina.  You have severe abdominal cramping or pain.  You have rapid weight loss or gain.  You have shortness of breath with chest pain.  You notice sudden or extreme swelling of your face, hands, ankles, feet, or legs.  You have not felt your baby move in over an hour.  You have severe headaches that do not go away with medicine.  You have vision changes. Document Released: 02/20/2001 Document Revised: 03/03/2013 Document Reviewed: 04/29/2012 [facility] Patient Information 2015 [address], [place]. This information is not intended to replace advice given to you by your health care provider. Make sure you discuss any questions you have with your health care provider.

## 2018-01-17 NOTE — Progress Notes (Signed)
HIGH-RISK PREGNANCY VISIT Patient name: Natasha Bean MRN [id number]  Date of birth: 1985-08-27 Chief Complaint:   High Risk Gestation (PN2)  History of Present Illness:   Natasha Bean is a 32 y.o. G64P1001 female at [redacted]w[redacted]d with an Estimated Date of Delivery: 04/14/18 being seen today for ongoing management of a high-risk pregnancy complicated by [facility] on Labetalol.  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 Wants BTL. Contractions: Not present. Vag. Bleeding: None.  Movement: Present. denies leaking of fluid.  Review of Systems:   Pertinent items are noted in HPI Denies abnormal vaginal discharge w/ itching/odor/irritation, headaches, visual changes, shortness of breath, chest pain, abdominal pain, severe nausea/vomiting, or problems with urination or bowel movements unless otherwise stated above. Pertinent History Reviewed:  Reviewed past medical,surgical, social, obstetrical and family history.  Reviewed problem list, medications and allergies. Physical Assessment:   Vitals:   01/17/18 1008  BP: 124/71  Pulse: 90  Weight: 167 lb (75.8 kg)  Body mass index is 30.54 kg/m.           Physical Examination:   General appearance: alert, well appearing, and in no distress  Mental status: alert, oriented to person, place, and time  Skin: warm & dry   Extremities: Edema: None    Cardiovascular: normal heart rate noted  Respiratory: normal respiratory effort, no distress  Abdomen: gravid, soft, non-tender  Pelvic: Cervical exam deferred         Fetal Status: Fetal Heart Rate (bpm): 142 u/s Fundal Height: 25 cm Movement: Present    Fetal Surveillance Testing today:  [place] 27+4 wks,cephalic,cx 3.4 cm,anterior placenta gr 1,normal ovaries bilat,afi 15 cm,fhr 142 bpm,efw 1079 g 34%  Results for orders placed or performed in visit on 01/17/18 (from the past 24 hour(s))  POC Urinalysis Dipstick OB   Collection Time: 01/17/18 10:06 AM  Result Value Ref Range   Color,  UA     Clarity, UA     Glucose, UA Negative Negative   Bilirubin, UA     Ketones, UA neg    Spec Grav, UA     Blood, UA neg    pH, UA     POC,PROTEIN,UA Negative Negative, Trace   Urobilinogen, UA     Nitrite, UA neg    Leukocytes, UA Negative Negative   Appearance     Odor      Assessment & Plan:  1) High-risk pregnancy G2P1001 at [redacted]w[redacted]d with an Estimated Date of Delivery: 04/14/18   2) CHTN, stable on Labetalol, continue ASA, get baseline labs w/ pn2 today. EFW 34% today. Pt wanted me to speak w/ FOB on phone, who is concerned about baby's weight being low, explained baby is completely normal weight today, he started getting ill and raising voice, asking why baby hasn't gained any weight in 1st or 2nd trimesters until now, told him I didn't understand question, baby's weight is normal, he continued to get ill, and then hung up. Thoroughly explained to pt, baby's weight is normal at 34%, anything <10% considered small, we will continue to do u/s during pregnancy to check on baby's weight d/t her CHTN which does put her at increased r/f FGR.   3) H/O pre-e, then pp HELLP> continue ASA, get baseline labs today  4) Anemia> recheck today, continue Fe  5) Wants BTL> discussed risks/benefits, consent signed today  Meds: No orders of the defined types were placed in this encounter.  Labs/procedures today: pn2, flu & tdap  shots, efw/afi u/s  Treatment Plan:    Growth u/s @  34, 38wks     2x/wk testing nst/sono @ 32wks or weekly BPP   Deliver @ 39wks (meds):______   Reviewed: Preterm labor symptoms and general obstetric precautions including but not limited to vaginal bleeding, contractions, leaking of fluid and fetal movement were reviewed in detail with the patient.  All questions were answered.  Follow-up: Return in about 3 weeks (around 02/07/2018) for HROB, Sign BTL consent today.  Orders Placed This Encounter  Procedures  . Tdap vaccine greater than or equal to 7yo IM  . Flu Vaccine  QUAD 36+ mos IM  . Comprehensive metabolic panel  . Protein, urine, 24 hour  . POC Urinalysis Dipstick OB   Cheral Marker CNM, [facility] 01/17/2018 10:57 AM

## 2018-01-17 NOTE — Progress Notes (Signed)
[place] 27+4 wks,cephalic,cx 3.4 cm,anterior placenta gr 1,normal ovaries bilat,afi 15 cm,fhr 142 bpm,efw 1079 g 34%

## 2018-01-18 LAB — COMPREHENSIVE METABOLIC PANEL
ALBUMIN: 3.5 g/dL (ref 3.5–5.5)
ALT: 7 IU/L (ref 0–32)
AST: 15 IU/L (ref 0–40)
Albumin/Globulin Ratio: 1.4 (ref 1.2–2.2)
Alkaline Phosphatase: 63 IU/L (ref 39–117)
BILIRUBIN TOTAL: 0.4 mg/dL (ref 0.0–1.2)
BUN / CREAT RATIO: 13 (ref 9–23)
BUN: 11 mg/dL (ref 6–20)
CO2: 19 mmol/L — AB (ref 20–29)
CREATININE: 0.84 mg/dL (ref 0.57–1.00)
Calcium: 8.6 mg/dL — ABNORMAL LOW (ref 8.7–10.2)
Chloride: 105 mmol/L (ref 96–106)
GFR calc non Af Amer: 92 mL/min/{1.73_m2} (ref >59)
GFR, EST AFRICAN AMERICAN: 106 mL/min/{1.73_m2} (ref >59)
GLUCOSE: 71 mg/dL (ref 65–99)
Globulin, Total: 2.5 g/dL (ref 1.5–4.5)
Potassium: 3.9 mmol/L (ref 3.5–5.2)
Sodium: 137 mmol/L (ref 134–144)
TOTAL PROTEIN: 6 g/dL (ref 6.0–8.5)

## 2018-01-18 LAB — GLUCOSE TOLERANCE, 2 HOURS W/ 1HR
Glucose, 1 hour: 95 mg/dL (ref 65–179)
Glucose, 2 hour: 66 mg/dL (ref 65–152)
Glucose, Fasting: 76 mg/dL (ref 65–91)

## 2018-01-18 LAB — ANTIBODY SCREEN: Antibody Screen: NEGATIVE

## 2018-01-18 LAB — CBC
HEMATOCRIT: 26.6 % — AB (ref 34.0–46.6)
HEMOGLOBIN: 9.4 g/dL — AB (ref 11.1–15.9)
MCH: 30.9 pg (ref 26.6–33.0)
MCHC: 35.3 g/dL (ref 31.5–35.7)
MCV: 88 fL (ref 79–97)
Platelets: 297 10*3/uL (ref 150–450)
RBC: 3.04 x10E6/uL — ABNORMAL LOW (ref 3.77–5.28)
RDW: 11.8 % — ABNORMAL LOW (ref 12.3–15.4)
WBC: 10.4 10*3/uL (ref 3.4–10.8)

## 2018-01-18 LAB — RPR: RPR: NONREACTIVE

## 2018-01-18 LAB — HIV ANTIBODY (ROUTINE TESTING W REFLEX): HIV Screen 4th Generation wRfx: NONREACTIVE

## 2018-01-23 ENCOUNTER — Other Ambulatory Visit: Payer: Self-pay | Admitting: Women's Health

## 2018-01-23 DIAGNOSIS — O10919 Unspecified pre-existing hypertension complicating pregnancy, unspecified trimester: Secondary | ICD-10-CM | POA: Diagnosis not present

## 2018-01-23 DIAGNOSIS — O09299 Supervision of pregnancy with other poor reproductive or obstetric history, unspecified trimester: Secondary | ICD-10-CM | POA: Diagnosis not present

## 2018-01-23 DIAGNOSIS — E559 Vitamin D deficiency, unspecified: Secondary | ICD-10-CM | POA: Diagnosis not present

## 2018-01-23 DIAGNOSIS — I10 Essential (primary) hypertension: Secondary | ICD-10-CM | POA: Diagnosis not present

## 2018-01-23 DIAGNOSIS — N183 Chronic kidney disease, stage 3 (moderate): Secondary | ICD-10-CM | POA: Diagnosis not present

## 2018-01-23 DIAGNOSIS — D649 Anemia, unspecified: Secondary | ICD-10-CM | POA: Diagnosis not present

## 2018-01-23 DIAGNOSIS — Z79899 Other long term (current) drug therapy: Secondary | ICD-10-CM | POA: Diagnosis not present

## 2018-01-23 DIAGNOSIS — R809 Proteinuria, unspecified: Secondary | ICD-10-CM | POA: Diagnosis not present

## 2018-01-24 LAB — PROTEIN, URINE, 24 HOUR
Protein, 24H Urine: 98 mg/24 hr (ref 30–150)
Protein, Ur: 15 mg/dL

## 2018-01-29 DIAGNOSIS — R809 Proteinuria, unspecified: Secondary | ICD-10-CM | POA: Diagnosis not present

## 2018-01-29 DIAGNOSIS — I10 Essential (primary) hypertension: Secondary | ICD-10-CM | POA: Diagnosis not present

## 2018-01-29 DIAGNOSIS — E559 Vitamin D deficiency, unspecified: Secondary | ICD-10-CM | POA: Diagnosis not present

## 2018-01-29 DIAGNOSIS — E872 Acidosis: Secondary | ICD-10-CM | POA: Diagnosis not present

## 2018-02-10 ENCOUNTER — Ambulatory Visit (INDEPENDENT_AMBULATORY_CARE_PROVIDER_SITE_OTHER): Payer: Medicaid Other | Admitting: Obstetrics & Gynecology

## 2018-02-10 VITALS — BP 129/59 | HR 65 | Wt 168.0 lb

## 2018-02-10 DIAGNOSIS — O10919 Unspecified pre-existing hypertension complicating pregnancy, unspecified trimester: Secondary | ICD-10-CM

## 2018-02-10 DIAGNOSIS — O10913 Unspecified pre-existing hypertension complicating pregnancy, third trimester: Secondary | ICD-10-CM

## 2018-02-10 DIAGNOSIS — Z331 Pregnant state, incidental: Secondary | ICD-10-CM

## 2018-02-10 DIAGNOSIS — Z3A31 31 weeks gestation of pregnancy: Secondary | ICD-10-CM

## 2018-02-10 DIAGNOSIS — O0993 Supervision of high risk pregnancy, unspecified, third trimester: Secondary | ICD-10-CM

## 2018-02-10 DIAGNOSIS — Z1389 Encounter for screening for other disorder: Secondary | ICD-10-CM

## 2018-02-10 LAB — POCT URINALYSIS DIPSTICK OB
GLUCOSE, UA: NEGATIVE
Ketones, UA: NEGATIVE
LEUKOCYTES UA: NEGATIVE
Nitrite, UA: NEGATIVE
POC,PROTEIN,UA: NEGATIVE
RBC UA: NEGATIVE

## 2018-02-10 NOTE — Progress Notes (Signed)
   HIGH-RISK PREGNANCY VISIT Patient name: Natasha Bean MRN [id number]  Date of birth: 01/07/1986 Chief Complaint:   Routine Prenatal Visit  History of Present Illness:   Natasha Bean is a 32 y.o. 262P1001 female at 4456w0d with an Estimated Date of Delivery: 04/14/18 being seen today for ongoing management of a high-risk pregnancy complicated by chronic HTN.  Today she reports no complaints. Contractions: Not present. Vag. Bleeding: None.  Movement: Present. denies leaking of fluid.  Review of Systems:   Pertinent items are noted in HPI Denies abnormal vaginal discharge w/ itching/odor/irritation, headaches, visual changes, shortness of breath, chest pain, abdominal pain, severe nausea/vomiting, or problems with urination or bowel movements unless otherwise stated above. Pertinent History Reviewed:  Reviewed past medical,surgical, social, obstetrical and family history.  Reviewed problem list, medications and allergies. Physical Assessment:   Vitals:   02/10/18 0942  BP: (!) 129/59  Pulse: 65  Weight: 168 lb (76.2 kg)  Body mass index is 30.73 kg/m.           Physical Examination:   General appearance: alert, well appearing, and in no distress  Mental status: alert, oriented to person, place, and time  Skin: warm & dry   Extremities: Edema: None    Cardiovascular: normal heart rate noted  Respiratory: normal respiratory effort, no distress  Abdomen: gravid, soft, non-tender  Pelvic: Cervical exam deferred         Fetal Status: Fetal Heart Rate (bpm): 133 Fundal Height: 31 cm Movement: Present    Fetal Surveillance Testing today: FHR 133 FH 31   Results for orders placed or performed in visit on 02/10/18 (from the past 24 hour(s))  POC Urinalysis Dipstick OB   Collection Time: 02/10/18  9:48 AM  Result Value Ref Range   Color, UA     Clarity, UA     Glucose, UA Negative Negative   Bilirubin, UA     Ketones, UA neg    Spec Grav, UA     Blood, UA neg    pH, UA     POC,PROTEIN,UA Negative Negative, Trace, Small (1+), Moderate (2+), Large (3+), 4+   Urobilinogen, UA     Nitrite, UA neg    Leukocytes, UA Negative Negative   Appearance     Odor      Assessment & Plan:  1) High-risk pregnancy G2P1001 at 4056w0d with an Estimated Date of Delivery: 04/14/18   2) CHTN, stable  3) Anemia, stable  Meds: No orders of the defined types were placed in this encounter.   Labs/procedures today:   Treatment Plan:  Twice weekly surveillance, sonogram alternating with NST, induction at 39 weeks or as clinically indicated   Reviewed: Preterm labor symptoms and general obstetric precautions including but not limited to vaginal bleeding, contractions, leaking of fluid and fetal movement were reviewed in detail with the patient.  All questions were answered.  Follow-up: Return in about 1 week (around 02/17/2018) for BPP/sono, HROB.  Orders Placed This Encounter  Procedures  . US Fetal BPP W/O Non Stress  . US UA Cord Doppler  . US OB Follow Up  . POC Urinalysis Dipstick OB   Amaryllis DykeLuther H  02/10/2018 10:05 AM

## 2018-02-17 ENCOUNTER — Encounter: Payer: Self-pay | Admitting: Women's Health

## 2018-02-17 ENCOUNTER — Ambulatory Visit (INDEPENDENT_AMBULATORY_CARE_PROVIDER_SITE_OTHER): Payer: Medicaid Other | Admitting: Women's Health

## 2018-02-17 ENCOUNTER — Ambulatory Visit (INDEPENDENT_AMBULATORY_CARE_PROVIDER_SITE_OTHER): Payer: Medicaid Other

## 2018-02-17 VITALS — BP 128/76 | HR 85 | Wt 173.5 lb

## 2018-02-17 DIAGNOSIS — Z331 Pregnant state, incidental: Secondary | ICD-10-CM

## 2018-02-17 DIAGNOSIS — O10913 Unspecified pre-existing hypertension complicating pregnancy, third trimester: Secondary | ICD-10-CM

## 2018-02-17 DIAGNOSIS — O0993 Supervision of high risk pregnancy, unspecified, third trimester: Secondary | ICD-10-CM

## 2018-02-17 DIAGNOSIS — O10919 Unspecified pre-existing hypertension complicating pregnancy, unspecified trimester: Secondary | ICD-10-CM | POA: Diagnosis not present

## 2018-02-17 DIAGNOSIS — Z3A32 32 weeks gestation of pregnancy: Secondary | ICD-10-CM

## 2018-02-17 DIAGNOSIS — O0990 Supervision of high risk pregnancy, unspecified, unspecified trimester: Secondary | ICD-10-CM

## 2018-02-17 DIAGNOSIS — Z1389 Encounter for screening for other disorder: Secondary | ICD-10-CM

## 2018-02-17 LAB — POCT URINALYSIS DIPSTICK OB
Glucose, UA: NEGATIVE
KETONES UA: NEGATIVE
Leukocytes, UA: NEGATIVE
Nitrite, UA: NEGATIVE
PROTEIN: NEGATIVE
RBC UA: NEGATIVE

## 2018-02-17 NOTE — Progress Notes (Signed)
US 32 wks,cephalic,fhr 142 bpm,BPP 8/8,anterior placenta gr 2,normal ovaries bilat,afi 14 cm,RI .62,.67,.66,.66=73%,EFW 1968 g 52%

## 2018-02-17 NOTE — Progress Notes (Signed)
   HIGH-RISK PREGNANCY VISIT Patient name: Natasha NestleChristy A Bean MRN [id number]  Date of birth: 10/07/1985 Chief Complaint:   High Risk Gestation (US today; back pain and pain between legs; right hand numb in am )  History of Present Illness:   Natasha Bean is a 32 y.o. 172P1001 female at 6511w0d with an Estimated Date of Delivery: 04/14/18 being seen today for ongoing management of a high-risk pregnancy [facility]  Today she reports occ Lt back/flank pain- thinks r/t work. Some numbness Rt hand in am. Contractions: Not present. Vag. Bleeding: None.  Movement: Present. denies leaking of fluid.  Review of Systems:   Pertinent items are noted in HPI Denies abnormal vaginal discharge w/ itching/odor/irritation, headaches, visual changes, shortness of breath, chest pain, abdominal pain, severe nausea/vomiting, or problems with urination or bowel movements unless otherwise stated above. Pertinent History Reviewed:  Reviewed past medical,surgical, social, obstetrical and family history.  Reviewed problem list, medications and allergies. Physical Assessment:   Vitals:   02/17/18 1145  BP: 128/76  Pulse: 85  Weight: 173 lb 8 oz (78.7 kg)  Body mass index is 31.73 kg/m.           Physical Examination:   General appearance: alert, well appearing, and in no distress  Mental status: alert, oriented to person, place, and time  Skin: warm & dry   Extremities: Edema: None    Cardiovascular: normal heart rate noted  Respiratory: normal respiratory effort, no distress  Abdomen: gravid, soft, non-tender  Pelvic: Cervical exam deferred         Fetal Status: Fetal Heart Rate (bpm): 142 u/s   Movement: Present    Fetal Surveillance Testing today:  US 32 wks,cephalic,fhr 142 bpm,BPP 8/8,anterior placenta gr 2,normal ovaries bilat,afi 14 cm,RI .62,.67,.66,.66=73%,EFW 1968 g 52% Results for orders placed or performed in visit on 02/17/18 (from the past 24 hour(s))  POC Urinalysis  Dipstick OB   Collection Time: 02/17/18 11:39 AM  Result Value Ref Range   Color, UA     Clarity, UA     Glucose, UA Negative Negative   Bilirubin, UA     Ketones, UA neg    Spec Grav, UA     Blood, UA neg    pH, UA     POC,PROTEIN,UA Negative Negative, Trace, Small (1+), Moderate (2+), Large (3+), 4+   Urobilinogen, UA     Nitrite, UA neg    Leukocytes, UA Negative Negative   Appearance     Odor      Assessment & Plan:  1) High-risk pregnancy G2P1001 at 5811w0d with an Estimated Date of Delivery: 04/14/18   2) CHTN, stable, continue Labetalol 200mg  BID, ASA  3) H/O Pre-e & pp HELLP> ASA  Meds: No orders of the defined types were placed in this encounter.  Labs/procedures today: u/s  Treatment Plan: efw @ 36wks, begin 2x/wk testing nst alt w/ bpp/dopp now, IOL @ 39wks  Reviewed: Preterm labor symptoms and general obstetric precautions including but not limited to vaginal bleeding, contractions, leaking of fluid and fetal movement were reviewed in detail with the patient.  All questions were answered.  Follow-up: Return for Thurs for hrob/nst, start Mon bpp/dopp/hrob & thurs hrob/nst x 4wks.  Orders Placed This Encounter  Procedures  . POC Urinalysis Dipstick OB   Cheral MarkerKimberly R Mayana Irigoyen CNM, [facility]NP[facility] 02/17/2018 12:36 PM

## 2018-02-17 NOTE — Patient Instructions (Addendum)
Natasha Bean, I greatly value your feedback.  If you receive a survey following your visit with [place] today, we appreciate you taking the time to fill it out.  Thanks, Joellyn Haff, CNM, WHNP-BC   Call the office [phone]) or go to [facility] if:  You begin to have strong, frequent contractions  Your water breaks.  Sometimes it is a big gush of fluid, sometimes it is just a trickle that keeps getting your panties wet or running down your legs  You have vaginal bleeding.  It is normal to have a small amount of spotting if your cervix was checked.   You don't feel your baby moving like normal.  If you don't, get you something to eat and drink and lay down and focus on feeling your baby move.  You should feel at least 10 movements in 2 hours.  If you don't, you should call the office or go to [facility].   Call the office [phone]) or go to [facility] hospital for these signs of pre-eclampsia:  Severe headache that does not go away with Tylenol  Visual changes- seeing spots, double, blurred vision  Pain under your right breast or upper abdomen that does not go away with Tums or heartburn medicine  Nausea and/or vomiting  Severe swelling in your hands, feet, and face      Preterm Labor and Birth Information The normal length of a pregnancy is 39-41 weeks. Preterm labor is when labor starts before 37 completed weeks of pregnancy. What are the risk factors for preterm labor? Preterm labor is more likely to occur in women who:  Have certain infections during pregnancy such as a bladder infection, sexually transmitted infection, or infection inside the uterus (chorioamnionitis).  Have a shorter-than-normal cervix.  Have gone into preterm labor before.  Have had surgery on their cervix.  Are younger than age 83 or older than age 44.  Are African American.  Are pregnant with twins or multiple babies (multiple gestation).  Take street drugs or smoke while  pregnant.  Do not gain enough weight while pregnant.  Became pregnant shortly after having been pregnant.  What are the symptoms of preterm labor? Symptoms of preterm labor include:  Cramps similar to those that can happen during a menstrual period. The cramps may happen with diarrhea.  Pain in the abdomen or lower back.  Regular uterine contractions that may feel like tightening of the abdomen.  A feeling of increased pressure in the pelvis.  Increased watery or bloody mucus discharge from the vagina.  Water breaking (ruptured amniotic sac).  Why is it important to recognize signs of preterm labor? It is important to recognize signs of preterm labor because babies who are born prematurely may not be fully developed. This can put them at an increased risk for:  Long-term (chronic) heart and lung problems.  Difficulty immediately after birth with regulating body systems, including blood sugar, body temperature, heart rate, and breathing rate.  Bleeding in the brain.  Cerebral palsy.  Learning difficulties.  Death.  These risks are highest for babies who are born before 34 weeks of pregnancy. How is preterm labor treated? Treatment depends on the length of your pregnancy, your condition, and the health of your baby. It may involve:  Having a stitch (suture) placed in your cervix to prevent your cervix from opening too early (cerclage).  Taking or being given medicines, such as: ? Hormone medicines. These may be given early in pregnancy to help support  the pregnancy. ? Medicine to stop contractions. ? Medicines to help mature the baby's lungs. These may be prescribed if the risk of delivery is high. ? Medicines to prevent your baby from developing cerebral palsy.  If the labor happens before 34 weeks of pregnancy, you may need to stay in the hospital. What should I do if I think I am in preterm labor? If you think that you are going into preterm labor, call your health  care provider right away. How can I prevent preterm labor in future pregnancies? To increase your chance of having a full-term pregnancy:  Do not use any tobacco products, such as cigarettes, chewing tobacco, and e-cigarettes. If you need help quitting, ask your health care provider.  Do not use street drugs or medicines that have not been prescribed to you during your pregnancy.  Talk with your health care provider before taking any herbal supplements, even if you have been taking them regularly.  Make sure you gain a healthy amount of weight during your pregnancy.  Watch for infection. If you think that you might have an infection, get it checked right away.  Make sure to tell your health care provider if you have gone into preterm labor before.  This information is not intended to replace advice given to you by your health care provider. Make sure you discuss any questions you have with your health care provider. Document Released: 05/19/2003 Document Revised: 08/09/2015 Document Reviewed: 07/20/2015 Elsevier Interactive Patient Education  2018 [employer]

## 2018-02-20 ENCOUNTER — Other Ambulatory Visit: Payer: Self-pay

## 2018-02-20 ENCOUNTER — Encounter: Payer: Self-pay | Admitting: Obstetrics & Gynecology

## 2018-02-20 ENCOUNTER — Ambulatory Visit (INDEPENDENT_AMBULATORY_CARE_PROVIDER_SITE_OTHER): Payer: Medicaid Other | Admitting: Obstetrics & Gynecology

## 2018-02-20 VITALS — BP 118/59 | HR 73 | Wt 173.0 lb

## 2018-02-20 DIAGNOSIS — Z3A32 32 weeks gestation of pregnancy: Secondary | ICD-10-CM

## 2018-02-20 DIAGNOSIS — O09299 Supervision of pregnancy with other poor reproductive or obstetric history, unspecified trimester: Secondary | ICD-10-CM

## 2018-02-20 DIAGNOSIS — O0993 Supervision of high risk pregnancy, unspecified, third trimester: Secondary | ICD-10-CM

## 2018-02-20 DIAGNOSIS — Z331 Pregnant state, incidental: Secondary | ICD-10-CM

## 2018-02-20 DIAGNOSIS — I10 Essential (primary) hypertension: Secondary | ICD-10-CM | POA: Diagnosis not present

## 2018-02-20 DIAGNOSIS — D509 Iron deficiency anemia, unspecified: Secondary | ICD-10-CM | POA: Diagnosis not present

## 2018-02-20 DIAGNOSIS — O10913 Unspecified pre-existing hypertension complicating pregnancy, third trimester: Secondary | ICD-10-CM | POA: Diagnosis not present

## 2018-02-20 DIAGNOSIS — Z1389 Encounter for screening for other disorder: Secondary | ICD-10-CM

## 2018-02-20 DIAGNOSIS — O09293 Supervision of pregnancy with other poor reproductive or obstetric history, third trimester: Secondary | ICD-10-CM | POA: Diagnosis not present

## 2018-02-20 DIAGNOSIS — O10919 Unspecified pre-existing hypertension complicating pregnancy, unspecified trimester: Secondary | ICD-10-CM

## 2018-02-20 DIAGNOSIS — R809 Proteinuria, unspecified: Secondary | ICD-10-CM | POA: Diagnosis not present

## 2018-02-20 DIAGNOSIS — E559 Vitamin D deficiency, unspecified: Secondary | ICD-10-CM | POA: Diagnosis not present

## 2018-02-20 DIAGNOSIS — Z79899 Other long term (current) drug therapy: Secondary | ICD-10-CM | POA: Diagnosis not present

## 2018-02-20 DIAGNOSIS — N183 Chronic kidney disease, stage 3 (moderate): Secondary | ICD-10-CM | POA: Diagnosis not present

## 2018-02-20 LAB — POCT URINALYSIS DIPSTICK OB
Blood, UA: NEGATIVE
Glucose, UA: NEGATIVE
Ketones, UA: NEGATIVE
Leukocytes, UA: NEGATIVE
Nitrite, UA: NEGATIVE
POC,PROTEIN,UA: NEGATIVE

## 2018-02-20 NOTE — Progress Notes (Signed)
   HIGH-RISK PREGNANCY VISIT Patient name: Natasha NestleChristy A Bean MRN [id number]  Date of birth: 04/16/1985 Chief Complaint:   High Risk Gestation (NST)  History of Present Illness:   Natasha Bean is a 32 y.o. 632P1001 female at 6046w3d with an Estimated Date of Delivery: 04/14/18 being seen today for ongoing management of a high-risk pregnancy complicated by chronic HTN, hx of HELLP.  Today she reports no complaints. Contractions: Not present. Vag. Bleeding: None.  Movement: Present. denies leaking of fluid.  Review of Systems:   Pertinent items are noted in HPI Denies abnormal vaginal discharge w/ itching/odor/irritation, headaches, visual changes, shortness of breath, chest pain, abdominal pain, severe nausea/vomiting, or problems with urination or bowel movements unless otherwise stated above. Pertinent History Reviewed:  Reviewed past medical,surgical, social, obstetrical and family history.  Reviewed problem list, medications and allergies. Physical Assessment:   Vitals:   02/20/18 0944  BP: (!) 118/59  Pulse: 73  Weight: 173 lb (78.5 kg)  Body mass index is 31.64 kg/m.           Physical Examination:   General appearance: alert, well appearing, and in no distress  Mental status: alert, oriented to person, place, and time  Skin: warm & dry   Extremities: Edema: None    Cardiovascular: normal heart rate noted  Respiratory: normal respiratory effort, no distress  Abdomen: gravid, soft, non-tender  Pelvic: Cervical exam deferred         Fetal Status: Fetal Heart Rate (bpm): 135 Fundal Height: 34 cm Movement: Present    Fetal Surveillance Testing today: reactive NST   Results for orders placed or performed in visit on 02/20/18 (from the past 24 hour(s))  POC Urinalysis Dipstick OB   Collection Time: 02/20/18  9:43 AM  Result Value Ref Range   Color, UA     Clarity, UA     Glucose, UA Negative Negative   Bilirubin, UA     Ketones, UA neg    Spec Grav, UA     Blood, UA neg      pH, UA     POC,PROTEIN,UA Negative Negative, Trace, Small (1+), Moderate (2+), Large (3+), 4+   Urobilinogen, UA     Nitrite, UA neg    Leukocytes, UA Negative Negative   Appearance     Odor      Assessment & Plan:  1) High-risk pregnancy G2P1001 at 846w3d with an Estimated Date of Delivery: 04/14/18   2) CHTN, stable, labetalol 200 daily and on baby ASA 162 mg daily  3) Hx of severe pre eclampsia and HELLP, stable  Meds: No orders of the defined types were placed in this encounter.   Labs/procedures today: reactive NST  Treatment Plan:  Twice weekly surveillance IOL 39 weeks or as clinically indicated  Reviewed: Preterm labor symptoms and general obstetric precautions including but not limited to vaginal bleeding, contractions, leaking of fluid and fetal movement were reviewed in detail with the patient.  All questions were answered.  Follow-up: Return in about 4 days (around 02/24/2018) for sonogram + , HROB.  Orders Placed This Encounter  Procedures  . POC Urinalysis Dipstick OB   Lazaro ArmsLuther H Eure  02/20/2018 10:42 AM

## 2018-02-21 ENCOUNTER — Other Ambulatory Visit: Payer: Self-pay | Admitting: *Deleted

## 2018-02-21 DIAGNOSIS — O10919 Unspecified pre-existing hypertension complicating pregnancy, unspecified trimester: Secondary | ICD-10-CM

## 2018-02-24 ENCOUNTER — Other Ambulatory Visit: Payer: Self-pay

## 2018-02-24 ENCOUNTER — Ambulatory Visit (INDEPENDENT_AMBULATORY_CARE_PROVIDER_SITE_OTHER): Payer: Medicaid Other | Admitting: Women's Health

## 2018-02-24 ENCOUNTER — Encounter: Payer: Self-pay | Admitting: Women's Health

## 2018-02-24 ENCOUNTER — Ambulatory Visit (INDEPENDENT_AMBULATORY_CARE_PROVIDER_SITE_OTHER): Payer: Medicaid Other

## 2018-02-24 VITALS — BP 131/82 | HR 80 | Wt 174.0 lb

## 2018-02-24 DIAGNOSIS — O10919 Unspecified pre-existing hypertension complicating pregnancy, unspecified trimester: Secondary | ICD-10-CM

## 2018-02-24 DIAGNOSIS — O10913 Unspecified pre-existing hypertension complicating pregnancy, third trimester: Secondary | ICD-10-CM

## 2018-02-24 DIAGNOSIS — O0993 Supervision of high risk pregnancy, unspecified, third trimester: Secondary | ICD-10-CM

## 2018-02-24 DIAGNOSIS — Z3A33 33 weeks gestation of pregnancy: Secondary | ICD-10-CM

## 2018-02-24 DIAGNOSIS — Z331 Pregnant state, incidental: Secondary | ICD-10-CM

## 2018-02-24 DIAGNOSIS — Z1389 Encounter for screening for other disorder: Secondary | ICD-10-CM

## 2018-02-24 DIAGNOSIS — O0990 Supervision of high risk pregnancy, unspecified, unspecified trimester: Secondary | ICD-10-CM

## 2018-02-24 LAB — POCT URINALYSIS DIPSTICK OB
Blood, UA: NEGATIVE
GLUCOSE, UA: NEGATIVE
Ketones, UA: NEGATIVE
Leukocytes, UA: NEGATIVE
Nitrite, UA: NEGATIVE
POC,PROTEIN,UA: NEGATIVE

## 2018-02-24 NOTE — Progress Notes (Signed)
   HIGH-RISK PREGNANCY VISIT Patient name: Natasha NestleChristy A Bean MRN [id number]  Date of birth: 02/19/1986 Chief Complaint:   High Risk Gestation (u/s today)  History of Present Illness:   Natasha Bean is a 32 y.o. 712P1001 female at 5375w0d with an Estimated Date of Delivery: 04/14/18 being seen today for ongoing management of a high-risk pregnancy [facility], h/o pre-e and pp HELLP.  Today she reports no complaints. Contractions: Not present. Vag. Bleeding: None.  Movement: Present. denies leaking of fluid.  Review of Systems:   Pertinent items are noted in HPI Denies abnormal vaginal discharge w/ itching/odor/irritation, headaches, visual changes, shortness of breath, chest pain, abdominal pain, severe nausea/vomiting, or problems with urination or bowel movements unless otherwise stated above. Pertinent History Reviewed:  Reviewed past medical,surgical, social, obstetrical and family history.  Reviewed problem list, medications and allergies. Physical Assessment:   Vitals:   02/24/18 1205  BP: 131/82  Pulse: 80  Weight: 174 lb (78.9 kg)  Body mass index is 31.83 kg/m.           Physical Examination:   General appearance: alert, well appearing, and in no distress  Mental status: alert, oriented to person, place, and time  Skin: warm & dry   Extremities: Edema: None    Cardiovascular: normal heart rate noted  Respiratory: normal respiratory effort, no distress  Abdomen: gravid, soft, non-tender  Pelvic: Cervical exam deferred         Fetal Status:     Movement: Present    Fetal Surveillance Testing today:  US 33 wks,cephalic,BPP 8/8,fhr 140 bpm,anterior placenta gr 2,cx 3.5 cm,normal ovaries bilat,afi 17.5 cm,RI .65,.64,.54,.65=58%  Results for orders placed or performed in visit on 02/24/18 (from the past 24 hour(s))  POC Urinalysis Dipstick OB   Collection Time: 02/24/18 12:05 PM  Result Value Ref Range   Color, UA     Clarity, UA     Glucose, UA  Negative Negative   Bilirubin, UA     Ketones, UA neg    Spec Grav, UA     Blood, UA neg    pH, UA     POC,PROTEIN,UA Negative Negative, Trace, Small (1+), Moderate (2+), Large (3+), 4+   Urobilinogen, UA     Nitrite, UA neg    Leukocytes, UA Negative Negative   Appearance     Odor      Assessment & Plan:  1) High-risk pregnancy G2P1001 at 2275w0d with an Estimated Date of Delivery: 04/14/18   2) CHTN, stable, continue Labetalol, ASA  3) H/O pre-e and pp HELLP, continue ASA  Meds: No orders of the defined types were placed in this encounter.   Labs/procedures today: bpp/dopp u/s  Treatment Plan:   Growth u/s @ 36wks     2x/wk testing nst/sono @ 32wks or weekly BPP   Deliver @ 39wks (meds)  Reviewed: Preterm labor symptoms and general obstetric precautions including but not limited to vaginal bleeding, contractions, leaking of fluid and fetal movement were reviewed in detail with the patient.  All questions were answered.  Follow-up: Return for As scheduled Thurs for hrob/nst.  Orders Placed This Encounter  Procedures  . POC Urinalysis Dipstick OB   Cheral MarkerKimberly R Secundino Ellithorpe CNM, [facility]NP-BC 02/24/2018 12:46 PM

## 2018-02-24 NOTE — Patient Instructions (Signed)
Natasha Bean, I greatly value your feedback.  If you receive a survey following your visit with us today, we appreciate you taking the time to fill it out.  Thanks, Joellyn HaffKim Francisco Ostrovsky, CNM, WHNP-BC   Call the office [phone]) or go to [facility] if:  You begin to have strong, frequent contractions  Your water breaks.  Sometimes it is a big gush of fluid, sometimes it is just a trickle that keeps getting your panties wet or running down your legs  You have vaginal bleeding.  It is normal to have a small amount of spotting if your cervix was checked.   You don't feel your baby moving like normal.  If you don't, get you something to eat and drink and lay down and focus on feeling your baby move.  You should feel at least 10 movements in 2 hours.  If you don't, you should call the office or go to [facility].     Preterm Labor and Birth Information The normal length of a pregnancy is 39-41 weeks. Preterm labor is when labor starts before 37 completed weeks of pregnancy. What are the risk factors for preterm labor? Preterm labor is more likely to occur in women who:  Have certain infections during pregnancy such as a bladder infection, sexually transmitted infection, or infection inside the uterus (chorioamnionitis).  Have a shorter-than-normal cervix.  Have gone into preterm labor before.  Have had surgery on their cervix.  Are younger than age 917 or older than age 33.  Are African American.  Are pregnant with twins or multiple babies (multiple gestation).  Take street drugs or smoke while pregnant.  Do not gain enough weight while pregnant.  Became pregnant shortly after having been pregnant.  What are the symptoms of preterm labor? Symptoms of preterm labor include:  Cramps similar to those that can happen during a menstrual period. The cramps may happen with diarrhea.  Pain in the abdomen or lower back.  Regular uterine contractions that may feel like tightening  of the abdomen.  A feeling of increased pressure in the pelvis.  Increased watery or bloody mucus discharge from the vagina.  Water breaking (ruptured amniotic sac).  Why is it important to recognize signs of preterm labor? It is important to recognize signs of preterm labor because babies who are born prematurely may not be fully developed. This can put them at an increased risk for:  Long-term (chronic) heart and lung problems.  Difficulty immediately after birth with regulating body systems, including blood sugar, body temperature, heart rate, and breathing rate.  Bleeding in the brain.  Cerebral palsy.  Learning difficulties.  Death.  These risks are highest for babies who are born before 34 weeks of pregnancy. How is preterm labor treated? Treatment depends on the length of your pregnancy, your condition, and the health of your baby. It may involve:  Having a stitch (suture) placed in your cervix to prevent your cervix from opening too early (cerclage).  Taking or being given medicines, such as: ? Hormone medicines. These may be given early in pregnancy to help support the pregnancy. ? Medicine to stop contractions. ? Medicines to help mature the baby's lungs. These may be prescribed if the risk of delivery is high. ? Medicines to prevent your baby from developing cerebral palsy.  If the labor happens before 34 weeks of pregnancy, you may need to stay in the hospital. What should I do if I think I am in preterm labor? If  you think that you are going into preterm labor, call your health care provider right away. How can I prevent preterm labor in future pregnancies? To increase your chance of having a full-term pregnancy:  Do not use any tobacco products, such as cigarettes, chewing tobacco, and e-cigarettes. If you need help quitting, ask your health care provider.  Do not use street drugs or medicines that have not been prescribed to you during your pregnancy.  Talk  with your health care provider before taking any herbal supplements, even if you have been taking them regularly.  Make sure you gain a healthy amount of weight during your pregnancy.  Watch for infection. If you think that you might have an infection, get it checked right away.  Make sure to tell your health care provider if you have gone into preterm labor before.  This information is not intended to replace advice given to you by your health care provider. Make sure you discuss any questions you have with your health care provider. Document Released: 05/19/2003 Document Revised: 08/09/2015 Document Reviewed: 07/20/2015 Elsevier Interactive Patient Education  2018 [employer].

## 2018-02-24 NOTE — Progress Notes (Signed)
US 33 wks,cephalic,BPP 8/8,fhr 140 bpm,anterior placenta gr 2,cx 3.5 cm,normal ovaries bilat,afi 17.5 cm,RI .65,.64,.54,.65=58%

## 2018-02-27 ENCOUNTER — Ambulatory Visit (INDEPENDENT_AMBULATORY_CARE_PROVIDER_SITE_OTHER): Payer: Medicaid Other | Admitting: Obstetrics and Gynecology

## 2018-02-27 VITALS — BP 132/78 | HR 88 | Wt 173.4 lb

## 2018-02-27 DIAGNOSIS — Z1389 Encounter for screening for other disorder: Secondary | ICD-10-CM

## 2018-02-27 DIAGNOSIS — O10913 Unspecified pre-existing hypertension complicating pregnancy, third trimester: Secondary | ICD-10-CM | POA: Diagnosis not present

## 2018-02-27 DIAGNOSIS — Z3A33 33 weeks gestation of pregnancy: Secondary | ICD-10-CM | POA: Diagnosis not present

## 2018-02-27 DIAGNOSIS — O10919 Unspecified pre-existing hypertension complicating pregnancy, unspecified trimester: Secondary | ICD-10-CM

## 2018-02-27 DIAGNOSIS — O0993 Supervision of high risk pregnancy, unspecified, third trimester: Secondary | ICD-10-CM | POA: Diagnosis not present

## 2018-02-27 DIAGNOSIS — Z331 Pregnant state, incidental: Secondary | ICD-10-CM

## 2018-02-27 DIAGNOSIS — O0990 Supervision of high risk pregnancy, unspecified, unspecified trimester: Secondary | ICD-10-CM

## 2018-02-27 LAB — POCT URINALYSIS DIPSTICK OB
Blood, UA: NEGATIVE
GLUCOSE, UA: NEGATIVE
Ketones, UA: NEGATIVE
Leukocytes, UA: NEGATIVE
Nitrite, UA: NEGATIVE
POC,PROTEIN,UA: NEGATIVE

## 2018-02-27 NOTE — Progress Notes (Signed)
Patient ID: Natasha Bean Jaskot, female   DOB: 02/28/1986, 32 y.o.   MRN: [id number]    [facility] Patient name: Natasha Bean Rashid MRN [id number]  Date of birth: 08/26/1985 Chief Complaint:   Routine Prenatal Visit (NST)  History of Present Illness:   Natasha Bean is Bean 32 y.o. 512P1001 female at 4091w3d with an Estimated Date of Delivery: 04/14/18 being seen today for ongoing management of Bean high-risk pregnancy complicated by chronic HTN.  Today she reports no complaints. Contractions: Not present. Vag. Bleeding: None.  Movement: Present. denies leaking of fluid.  Review of Systems:   Pertinent items are noted in HPI Denies abnormal vaginal discharge w/ itching/odor/irritation, headaches, visual changes, shortness of breath, chest pain, abdominal pain, severe nausea/vomiting, or problems with urination or bowel movements unless otherwise stated above. Pertinent History Reviewed:  Reviewed past medical,surgical, social, obstetrical and family history.  Reviewed problem list, medications and allergies. Physical Assessment:   Vitals:   02/27/18 0946  BP: 132/78  Pulse: 88  Weight: 173 lb 6.4 oz (78.7 kg)  Body mass index is 31.72 kg/m.           Physical Examination:   General appearance: alert, well appearing, and in no distress and oriented to person, place, and time  Mental status: alert, oriented to person, place, and time, normal mood, behavior, speech, dress, motor activity, and thought processes, affect appropriate to mood  Skin: warm & dry   Extremities: Edema: None    Cardiovascular: normal heart rate noted  Respiratory: normal respiratory effort, no distress  Abdomen: gravid, soft, non-tender  Pelvic: Cervical exam deferred         Fetal Status:   Fundal Height: 35 cm Movement: Present    Fetal Surveillance Testing today: NST reactive  Results for orders placed or performed in visit on 02/27/18 (from the past 24 hour(s))  POC Urinalysis Dipstick OB   Collection Time: 02/27/18  9:49 AM  Result Value Ref Range   Color, UA     Clarity, UA     Glucose, UA Negative Negative   Bilirubin, UA     Ketones, UA neg    Spec Grav, UA     Blood, UA neg    pH, UA     POC,PROTEIN,UA Negative Negative, Trace, Small (1+), Moderate (2+), Large (3+), 4+   Urobilinogen, UA     Nitrite, UA neg    Leukocytes, UA Negative Negative   Appearance     Odor      Assessment & Plan:  1) High-risk pregnancy G2P1001 at 9491w3d with an Estimated Date of Delivery: 04/14/18   2) CHTN, stable, continue Labetolol  Meds: No orders of the defined types were placed in this encounter.   Labs/procedures today: NST  Treatment Plan:  BPP 03/03/18  Follow-up: Return in about 4 days (around 03/03/2018) for BPP.  Orders Placed This Encounter  Procedures  . POC Urinalysis Dipstick OB   By signing my name below, I, Arnette NorrisMari Johnson, attest that this documentation has been prepared under the direction and in the presence of Tilda BurrowFerguson, Lizabeth Fellner V, MD. Electronically Signed: Arnette NorrisMari Johnson Medical Scribe. 02/27/18. 10:21 AM.  I personally performed the services described in this documentation, which was SCRIBED in my presence. The recorded information has been reviewed and considered accurate. It has been edited as necessary during review. Tilda BurrowJohn V Michael Ventresca, MD

## 2018-03-03 ENCOUNTER — Ambulatory Visit (HOSPITAL_COMMUNITY): Payer: Medicaid Other

## 2018-03-06 ENCOUNTER — Ambulatory Visit (INDEPENDENT_AMBULATORY_CARE_PROVIDER_SITE_OTHER): Payer: Medicaid Other | Admitting: Obstetrics and Gynecology

## 2018-03-06 ENCOUNTER — Encounter: Payer: Self-pay | Admitting: Obstetrics and Gynecology

## 2018-03-06 VITALS — BP 133/83 | HR 89 | Wt 176.0 lb

## 2018-03-06 DIAGNOSIS — O0990 Supervision of high risk pregnancy, unspecified, unspecified trimester: Secondary | ICD-10-CM

## 2018-03-06 DIAGNOSIS — O0993 Supervision of high risk pregnancy, unspecified, third trimester: Secondary | ICD-10-CM | POA: Diagnosis not present

## 2018-03-06 DIAGNOSIS — O09293 Supervision of pregnancy with other poor reproductive or obstetric history, third trimester: Secondary | ICD-10-CM

## 2018-03-06 DIAGNOSIS — O10919 Unspecified pre-existing hypertension complicating pregnancy, unspecified trimester: Secondary | ICD-10-CM

## 2018-03-06 DIAGNOSIS — Z3A34 34 weeks gestation of pregnancy: Secondary | ICD-10-CM

## 2018-03-06 DIAGNOSIS — O10913 Unspecified pre-existing hypertension complicating pregnancy, third trimester: Secondary | ICD-10-CM

## 2018-03-06 DIAGNOSIS — O09299 Supervision of pregnancy with other poor reproductive or obstetric history, unspecified trimester: Secondary | ICD-10-CM

## 2018-03-06 DIAGNOSIS — Z1389 Encounter for screening for other disorder: Secondary | ICD-10-CM

## 2018-03-06 DIAGNOSIS — Z331 Pregnant state, incidental: Secondary | ICD-10-CM

## 2018-03-06 LAB — POCT URINALYSIS DIPSTICK OB
Blood, UA: NEGATIVE
Glucose, UA: NEGATIVE
Ketones, UA: NEGATIVE
Leukocytes, UA: NEGATIVE
Nitrite, UA: NEGATIVE
POC,PROTEIN,UA: NEGATIVE

## 2018-03-06 NOTE — Progress Notes (Signed)
Subjective:  Natasha Bean is a 32 y.o. G2P1001 at 2562w3d being seen today for ongoing prenatal care.  She is currently monitored for the following issues for this high-risk pregnancy and has Anemia affecting second pregnancy; Supervision of high risk pregnancy, antepartum; Smoker; Chronic hypertension affecting pregnancy; and H/O preeclampsia then postpartum HELLP on their problem list.  Patient reports general doscomforts of pregnncy. Denies HA or visual changes.  Contractions: Not present. Vag. Bleeding: None.  Movement: Present. Denies leaking of fluid.   The following portions of the patient's history were reviewed and updated as appropriate: allergies, current medications, past family history, past medical history, past social history, past surgical history and problem list. Problem list updated.  Objective:   Vitals:   03/06/18 1008  BP: 133/83  Pulse: 89  Weight: 176 lb (79.8 kg)    Fetal Status:     Movement: Present     General:  Alert, oriented and cooperative. Patient is in no acute distress.  Skin: Skin is warm and dry. No rash noted.   Cardiovascular: Normal heart rate noted  Respiratory: Normal respiratory effort, no problems with respiration noted  Abdomen: Soft, gravid, appropriate for gestational age. Pain/Pressure: Absent     Pelvic:  Cervical exam deferred        Extremities: Normal range of motion.  Edema: None  Mental Status: Normal mood and affect. Normal behavior. Normal judgment and thought content.   Urinalysis:      Assessment and Plan:  Pregnancy: G2P1001 at 4762w3d  1. Supervision of high risk pregnancy, antepartum Stable RNST today  2. Screening for genitourinary condition  - POC Urinalysis Dipstick OB  3. Pregnant state, incidental  - POC Urinalysis Dipstick OB  4. Chronic hypertension affecting pregnancy BP stable on current regiment Continue with antenatal testing RNST today  5. H/O preeclampsia then postpartum HELLP No S/Sx  presently  Preterm labor symptoms and general obstetric precautions including but not limited to vaginal bleeding, contractions, leaking of fluid and fetal movement were reviewed in detail with the patient. Please refer to After Visit Summary for other counseling recommendations.  Return in about 1 week (around 03/13/2018) for OB visit.   Hermina StaggersErvin, Taj Nevins L, MD

## 2018-03-06 NOTE — Patient Instructions (Signed)
Third Trimester of Pregnancy The third trimester is from week 28 through week 40 (months 7 through 9). The third trimester is a time when the unborn baby (fetus) is growing rapidly. At the end of the ninth month, the fetus is about 20 inches in length and weighs 6-10 pounds. Body changes during your third trimester Your body will continue to go through many changes during pregnancy. The changes vary from woman to woman. During the third trimester:  Your weight will continue to increase. You can expect to gain 25-35 pounds (11-16 kg) by the end of the pregnancy.  You may begin to get stretch marks on your hips, abdomen, and breasts.  You may urinate more often because the fetus is moving lower into your pelvis and pressing on your bladder.  You may develop or continue to have heartburn. This is caused by increased hormones that slow down muscles in the digestive tract.  You may develop or continue to have constipation because increased hormones slow digestion and cause the muscles that push waste through your intestines to relax.  You may develop hemorrhoids. These are swollen veins (varicose veins) in the rectum that can itch or be painful.  You may develop swollen, bulging veins (varicose veins) in your legs.  You may have increased body aches in the pelvis, back, or thighs. This is due to weight gain and increased hormones that are relaxing your joints.  You may have changes in your hair. These can include thickening of your hair, rapid growth, and changes in texture. Some women also have hair loss during or after pregnancy, or hair that feels dry or thin. Your hair will most likely return to normal after your baby is born.  Your breasts will continue to grow and they will continue to become tender. A yellow fluid (colostrum) may leak from your breasts. This is the first milk you are producing for your baby.  Your belly button may stick out.  You may notice more swelling in your hands,  face, or ankles.  You may have increased tingling or numbness in your hands, arms, and legs. The skin on your belly may also feel numb.  You may feel short of breath because of your expanding uterus.  You may have more problems sleeping. This can be caused by the size of your belly, increased need to urinate, and an increase in your body's metabolism.  You may notice the fetus "dropping," or moving lower in your abdomen (lightening).  You may have increased vaginal discharge.  You may notice your joints feel loose and you may have pain around your pelvic bone. What to expect at prenatal visits You will have prenatal exams every 2 weeks until week 36. Then you will have weekly prenatal exams. During a routine prenatal visit:  You will be weighed to make sure you and the baby are growing normally.  Your blood pressure will be taken.  Your abdomen will be measured to track your baby's growth.  The fetal heartbeat will be listened to.  Any test results from the previous visit will be discussed.  You may have a cervical check near your due date to see if your cervix has softened or thinned (effaced).  You will be tested for Group B streptococcus. This happens between 35 and 37 weeks. Your health care provider may ask you:  What your birth plan is.  How you are feeling.  If you are feeling the baby move.  If you have had any abnormal   symptoms, such as leaking fluid, bleeding, severe headaches, or abdominal cramping.  If you are using any tobacco products, including cigarettes, chewing tobacco, and electronic cigarettes.  If you have any questions. Other tests or screenings that may be performed during your third trimester include:  Blood tests that check for low iron levels (anemia).  Fetal testing to check the health, activity level, and growth of the fetus. Testing is done if you have certain medical conditions or if there are problems during the pregnancy.  Nonstress test  (NST). This test checks the health of your baby to make sure there are no signs of problems, such as the baby not getting enough oxygen. During this test, a belt is placed around your belly. The baby is made to move, and its heart rate is monitored during movement. What is false labor? False labor is a condition in which you feel small, irregular tightenings of the muscles in the womb (contractions) that usually go away with rest, changing position, or drinking water. These are called Braxton Hicks contractions. Contractions may last for hours, days, or even weeks before true labor sets in. If contractions come at regular intervals, become more frequent, increase in intensity, or become painful, you should see your health care provider. What are the signs of labor?  Abdominal cramps.  Regular contractions that start at 10 minutes apart and become stronger and more frequent with time.  Contractions that start on the top of the uterus and spread down to the lower abdomen and back.  Increased pelvic pressure and dull back pain.  A watery or bloody mucus discharge that comes from the vagina.  Leaking of amniotic fluid. This is also known as your "water breaking." It could be a slow trickle or a gush. Let your health care provider know if it has a color or strange odor. If you have any of these signs, call your health care provider right away, even if it is before your due date. Follow these instructions at home: Medicines  Follow your health care provider's instructions regarding medicine use. Specific medicines may be either safe or unsafe to take during pregnancy.  Take a prenatal vitamin that contains at least 600 micrograms (mcg) of folic acid.  If you develop constipation, try taking a stool softener if your health care provider approves. Eating and drinking   Eat a balanced diet that includes fresh fruits and vegetables, whole grains, good sources of protein such as meat, eggs, or tofu,  and low-fat dairy. Your health care provider will help you determine the amount of weight gain that is right for you.  Avoid raw meat and uncooked cheese. These carry germs that can cause birth defects in the baby.  If you have low calcium intake from food, talk to your health care provider about whether you should take a daily calcium supplement.  Eat four or five small meals rather than three large meals a day.  Limit foods that are high in fat and processed sugars, such as fried and sweet foods.  To prevent constipation: ? Drink enough fluid to keep your urine clear or pale yellow. ? Eat foods that are high in fiber, such as fresh fruits and vegetables, whole grains, and beans. Activity  Exercise only as directed by your health care provider. Most women can continue their usual exercise routine during pregnancy. Try to exercise for 30 minutes at least 5 days a week. Stop exercising if you experience uterine contractions.  Avoid heavy lifting.  Do   not exercise in extreme heat or humidity, or at high altitudes.  Wear low-heel, comfortable shoes.  Practice good posture.  You may continue to have sex unless your health care provider tells you otherwise. Relieving pain and discomfort  Take frequent breaks and rest with your legs elevated if you have leg cramps or low back pain.  Take warm sitz baths to soothe any pain or discomfort caused by hemorrhoids. Use hemorrhoid cream if your health care provider approves.  Wear a good support bra to prevent discomfort from breast tenderness.  If you develop varicose veins: ? Wear support pantyhose or compression stockings as told by your healthcare provider. ? Elevate your feet for 15 minutes, 3-4 times a day. Prenatal care  Write down your questions. Take them to your prenatal visits.  Keep all your prenatal visits as told by your health care provider. This is important. Safety  Wear your seat belt at all times when driving.  Make  a list of emergency phone numbers, including numbers for family, friends, the hospital, and police and fire departments. General instructions  Avoid cat litter boxes and soil used by cats. These carry germs that can cause birth defects in the baby. If you have a cat, ask someone to clean the litter box for you.  Do not travel far distances unless it is absolutely necessary and only with the approval of your health care provider.  Do not use hot tubs, steam rooms, or saunas.  Do not drink alcohol.  Do not use any products that contain nicotine or tobacco, such as cigarettes and e-cigarettes. If you need help quitting, ask your health care provider.  Do not use any medicinal herbs or unprescribed drugs. These chemicals affect the formation and growth of the baby.  Do not douche or use tampons or scented sanitary pads.  Do not cross your legs for long periods of time.  To prepare for the arrival of your baby: ? Take prenatal classes to understand, practice, and ask questions about labor and delivery. ? Make a trial run to the hospital. ? Visit the hospital and tour the maternity area. ? Arrange for maternity or paternity leave through employers. ? Arrange for family and friends to take care of pets while you are in the hospital. ? Purchase a rear-facing car seat and make sure you know how to install it in your car. ? Pack your hospital bag. ? Prepare the baby's nursery. Make sure to remove all pillows and stuffed animals from the baby's crib to prevent suffocation.  Visit your dentist if you have not gone during your pregnancy. Use a soft toothbrush to brush your teeth and be gentle when you floss. Contact a health care provider if:  You are unsure if you are in labor or if your water has broken.  You become dizzy.  You have mild pelvic cramps, pelvic pressure, or nagging pain in your abdominal area.  You have lower back pain.  You have persistent nausea, vomiting, or  diarrhea.  You have an unusual or bad smelling vaginal discharge.  You have pain when you urinate. Get help right away if:  Your water breaks before 37 weeks.  You have regular contractions less than 5 minutes apart before 37 weeks.  You have a fever.  You are leaking fluid from your vagina.  You have spotting or bleeding from your vagina.  You have severe abdominal pain or cramping.  You have rapid weight loss or weight gain.  You have   shortness of breath with chest pain.  You notice sudden or extreme swelling of your face, hands, ankles, feet, or legs.  Your baby makes fewer than 10 movements in 2 hours.  You have severe headaches that do not go away when you take medicine.  You have vision changes. Summary  The third trimester is from week 28 through week 40, months 7 through 9. The third trimester is a time when the unborn baby (fetus) is growing rapidly.  During the third trimester, your discomfort may increase as you and your baby continue to gain weight. You may have abdominal, leg, and back pain, sleeping problems, and an increased need to urinate.  During the third trimester your breasts will keep growing and they will continue to become tender. A yellow fluid (colostrum) may leak from your breasts. This is the first milk you are producing for your baby.  False labor is a condition in which you feel small, irregular tightenings of the muscles in the womb (contractions) that eventually go away. These are called Braxton Hicks contractions. Contractions may last for hours, days, or even weeks before true labor sets in.  Signs of labor can include: abdominal cramps; regular contractions that start at 10 minutes apart and become stronger and more frequent with time; watery or bloody mucus discharge that comes from the vagina; increased pelvic pressure and dull back pain; and leaking of amniotic fluid. This information is not intended to replace advice given to you by your  health care provider. Make sure you discuss any questions you have with your health care provider. Document Released: 02/20/2001 Document Revised: 04/03/2016 Document Reviewed: 04/03/2016 Elsevier Interactive Patient Education  2019 [employer]  

## 2018-03-10 ENCOUNTER — Other Ambulatory Visit (HOSPITAL_COMMUNITY): Payer: Medicaid Other

## 2018-03-12 NOTE — L&D Delivery Note (Signed)
Patient: DARRA JANOSKY MRN: [id number]  GBS status: negative, IAP given: not indicated  Patient is a 33 y.o. now G2P2 s/p NSVD at [redacted]w[redacted]d, who was admitted for IOL for cHTN. AROM 0h 76m prior to delivery with clear fluid.    Delivery Note At 2:52 PM a viable female was delivered via Vaginal, Spontaneous (Presentation: cephalic; ROA).  APGAR: 9, 9; weight: pending (appears AGA).   Placenta status: intact, 3-vessel cord.  Cord:  with the following complications: none.  Anesthesia: epidural   Episiotomy: None Lacerations: None Suture Repair: None Est. Blood Loss (mL): 175  Mom to postpartum.  Baby to Couplet care / Skin to Skin.  Head delivered ROA. No nuchal cord present. Shoulder and body delivered in usual fashion. Infant with spontaneous cry, placed on mother's abdomen, dried and bulb suctioned. Cord clamped x 2 after 1-minute delay, and cut by family member. Cord blood drawn. Placenta delivered spontaneously with gentle cord traction. Fundus firm with massage and Pitocin. Perineum inspected and found to have no lacerations, with good hemostasis achieved.  Peggyann Shoals, DO PGY-1  Gwenevere Abbot 04/07/2018, 3:32 PM

## 2018-03-13 ENCOUNTER — Other Ambulatory Visit: Payer: Medicaid Other | Admitting: Obstetrics & Gynecology

## 2018-03-13 ENCOUNTER — Other Ambulatory Visit: Payer: Self-pay

## 2018-03-13 ENCOUNTER — Ambulatory Visit (INDEPENDENT_AMBULATORY_CARE_PROVIDER_SITE_OTHER): Payer: Medicaid Other | Admitting: Obstetrics & Gynecology

## 2018-03-13 VITALS — BP 130/80 | HR 81 | Wt 181.0 lb

## 2018-03-13 DIAGNOSIS — O10913 Unspecified pre-existing hypertension complicating pregnancy, third trimester: Secondary | ICD-10-CM

## 2018-03-13 DIAGNOSIS — O0993 Supervision of high risk pregnancy, unspecified, third trimester: Secondary | ICD-10-CM

## 2018-03-13 DIAGNOSIS — Z3A35 35 weeks gestation of pregnancy: Secondary | ICD-10-CM | POA: Diagnosis not present

## 2018-03-13 DIAGNOSIS — Z331 Pregnant state, incidental: Secondary | ICD-10-CM

## 2018-03-13 DIAGNOSIS — O10919 Unspecified pre-existing hypertension complicating pregnancy, unspecified trimester: Secondary | ICD-10-CM

## 2018-03-13 DIAGNOSIS — Z1389 Encounter for screening for other disorder: Secondary | ICD-10-CM

## 2018-03-13 LAB — POCT URINALYSIS DIPSTICK OB
Blood, UA: NEGATIVE
Glucose, UA: NEGATIVE
KETONES UA: NEGATIVE
Leukocytes, UA: NEGATIVE
Nitrite, UA: NEGATIVE

## 2018-03-13 MED ORDER — SILVER SULFADIAZINE 1 % EX CREA: TOPICAL_CREAM | CUTANEOUS | 11 refills | Status: DC

## 2018-03-13 NOTE — Progress Notes (Signed)
   HIGH-RISK PREGNANCY VISIT Patient name: Natasha Bean MRN [id number]  Date of birth: August 19, 1985 Chief Complaint:   High Risk Gestation (NST)  History of Present Illness:   Natasha Bean is a 33 y.o. G3P1001 female at [redacted]w[redacted]d with an Estimated Date of Delivery: 04/14/18 being seen today for ongoing management of a high-risk pregnancy complicated by chronic HTN.  Today she reports no complaints. Contractions: Not present. Vag. Bleeding: None.  Movement: Present. denies leaking of fluid.  Review of Systems:   Pertinent items are noted in HPI Denies abnormal vaginal discharge w/ itching/odor/irritation, headaches, visual changes, shortness of breath, chest pain, abdominal pain, severe nausea/vomiting, or problems with urination or bowel movements unless otherwise stated above. Pertinent History Reviewed:  Reviewed past medical,surgical, social, obstetrical and family history.  Reviewed problem list, medications and allergies. Physical Assessment:   Vitals:   03/13/18 1529  BP: 130/80  Pulse: 81  Weight: 181 lb (82.1 kg)  Body mass index is 33.11 kg/m.           Physical Examination:   General appearance: alert, well appearing, and in no distress  Mental status: alert, oriented to person, place, and time  Skin: warm & dry   Extremities: Edema: Trace    Cardiovascular: normal heart rate noted  Respiratory: normal respiratory effort, no distress  Abdomen: gravid, soft, non-tender  Pelvic: Cervical exam deferred         Fetal Status:     Movement: Present    Fetal Surveillance Testing today: reactive NST   Results for orders placed or performed in visit on 03/13/18 (from the past 24 hour(s))  POC Urinalysis Dipstick OB   Collection Time: 03/13/18  3:30 PM  Result Value Ref Range   Color, UA     Clarity, UA     Glucose, UA Negative Negative   Bilirubin, UA     Ketones, UA neg    Spec Grav, UA     Blood, UA neg    pH, UA     POC,PROTEIN,UA Trace Negative, Trace, Small  (1+), Moderate (2+), Large (3+), 4+   Urobilinogen, UA     Nitrite, UA neg    Leukocytes, UA Negative Negative   Appearance     Odor      Assessment & Plan:  1) High-risk pregnancy G2P1001 at [redacted]w[redacted]d with an Estimated Date of Delivery: 04/14/18   2) CHTN, stable, labetalol 200 daily + baby ASA    Meds:  Meds ordered this encounter  Medications  . silver sulfADIAZINE (SILVADENE) 1 % cream    Sig: Use to area 3-4 times daily    Dispense:  50 g    Refill:  11    Labs/procedures today: reactive NST  Treatment Plan:  Twice weekly surveillance, sonogram alternating with NST, induction at 39 weeks or as clinically indicated   Reviewed: Term labor symptoms and general obstetric precautions including but not limited to vaginal bleeding, contractions, leaking of fluid and fetal movement were reviewed in detail with the patient.  All questions were answered.  Follow-up: Return in about 4 days (around 03/17/2018) for BPP/sono, HROB.  Orders Placed This Encounter  Procedures  . POC Urinalysis Dipstick OB   Lazaro Arms  03/13/2018 3:59 PM

## 2018-03-14 ENCOUNTER — Other Ambulatory Visit: Payer: Self-pay | Admitting: Obstetrics & Gynecology

## 2018-03-14 DIAGNOSIS — O10919 Unspecified pre-existing hypertension complicating pregnancy, unspecified trimester: Secondary | ICD-10-CM

## 2018-03-17 ENCOUNTER — Ambulatory Visit (INDEPENDENT_AMBULATORY_CARE_PROVIDER_SITE_OTHER): Payer: Medicaid Other | Admitting: Obstetrics & Gynecology

## 2018-03-17 ENCOUNTER — Ambulatory Visit (INDEPENDENT_AMBULATORY_CARE_PROVIDER_SITE_OTHER): Payer: Medicaid Other

## 2018-03-17 VITALS — BP 137/84 | HR 91 | Wt 178.0 lb

## 2018-03-17 DIAGNOSIS — Z1389 Encounter for screening for other disorder: Secondary | ICD-10-CM | POA: Diagnosis not present

## 2018-03-17 DIAGNOSIS — O0990 Supervision of high risk pregnancy, unspecified, unspecified trimester: Secondary | ICD-10-CM

## 2018-03-17 DIAGNOSIS — O10919 Unspecified pre-existing hypertension complicating pregnancy, unspecified trimester: Secondary | ICD-10-CM

## 2018-03-17 DIAGNOSIS — O0993 Supervision of high risk pregnancy, unspecified, third trimester: Secondary | ICD-10-CM

## 2018-03-17 DIAGNOSIS — Z3A36 36 weeks gestation of pregnancy: Secondary | ICD-10-CM

## 2018-03-17 DIAGNOSIS — O10913 Unspecified pre-existing hypertension complicating pregnancy, third trimester: Secondary | ICD-10-CM

## 2018-03-17 DIAGNOSIS — Z331 Pregnant state, incidental: Secondary | ICD-10-CM

## 2018-03-17 LAB — POCT URINALYSIS DIPSTICK OB
Glucose, UA: NEGATIVE
LEUKOCYTES UA: NEGATIVE
NITRITE UA: NEGATIVE

## 2018-03-17 NOTE — Progress Notes (Signed)
   HIGH-RISK PREGNANCY VISIT Patient name: Natasha Bean MRN [id number]  Date of birth: 09/17/1985 Chief Complaint:   High Risk Gestation (US today)  History of Present Illness:   Natasha Bean is a 33 y.o. 622P1001 female at 5821w0d with an Estimated Date of Delivery: 04/14/18 being seen today for ongoing management of a high-risk pregnancy complicated by chronic HTN.  Today she reports cramping. Contractions: Not present. Vag. Bleeding: None.  Movement: Present. denies leaking of fluid.  Review of Systems:   Pertinent items are noted in HPI Denies abnormal vaginal discharge w/ itching/odor/irritation, headaches, visual changes, shortness of breath, chest pain, abdominal pain, severe nausea/vomiting, or problems with urination or bowel movements unless otherwise stated above. Pertinent History Reviewed:  Reviewed past medical,surgical, social, obstetrical and family history.  Reviewed problem list, medications and allergies. Physical Assessment:   Vitals:   03/17/18 1009  BP: 137/84  Pulse: 91  Weight: 178 lb (80.7 kg)  Body mass index is 32.56 kg/m.           Physical Examination:   General appearance: alert, well appearing, and in no distress  Mental status: alert, oriented to person, place, and time  Skin: warm & dry   Extremities: Edema: Trace    Cardiovascular: normal heart rate noted  Respiratory: normal respiratory effort, no distress  Abdomen: gravid, soft, non-tender  Pelvic: Cervical exam performed  Dilation: Closed Effacement (%): Thick Station: -3LTC  Fetal Status: Fetal Heart Rate (bpm): 140 Fundal Height: 36 cm Movement: Present Presentation: Vertex  Fetal Surveillance Testing today: BPP 8/8   Results for orders placed or performed in visit on 03/17/18 (from the past 24 hour(s))  POC Urinalysis Dipstick OB   Collection Time: 03/17/18 10:13 AM  Result Value Ref Range   Color, UA     Clarity, UA     Glucose, UA Negative Negative   Bilirubin, UA     Ketones,  UA large    Spec Grav, UA     Blood, UA small    pH, UA     POC,PROTEIN,UA Small (1+) Negative, Trace, Small (1+), Moderate (2+), Large (3+), 4+   Urobilinogen, UA     Nitrite, UA neg    Leukocytes, UA Negative Negative   Appearance     Odor      Assessment & Plan:  1) High-risk pregnancy G2P1001 at 2221w0d with an Estimated Date of Delivery: 04/14/18   2) CHTN on labetalol 200 daily, stable, BPP 8/8 with normal Dopplers    Meds: No orders of the defined types were placed in this encounter.   Labs/procedures today: sonogram  Treatment Plan:  Twice weekly surveillance, sonogram alternating with NST, induction at 39 weeks or as clinically indicated   Reviewed: Term labor symptoms and general obstetric precautions including but not limited to vaginal bleeding, contractions, leaking of fluid and fetal movement were reviewed in detail with the patient.  All questions were answered.  Follow-up: Return in about 3 days (around 03/20/2018) for NST, HROB.  Orders Placed This Encounter  Procedures  . Urine Culture  . POC Urinalysis Dipstick OB   Lazaro ArmsLuther H Eure  03/17/2018 10:37 AM

## 2018-03-17 NOTE — Progress Notes (Signed)
[place] 36 wks,cephalic,BPP 8/8,anterior placenta gr 3,bilat adnexa's wnl,afi 14.5 cm,fhr 140 bpm,RI .62,.69,.62=81%,EFW 2973 g 67%,AC 94%

## 2018-03-19 LAB — URINE CULTURE

## 2018-03-20 ENCOUNTER — Ambulatory Visit (INDEPENDENT_AMBULATORY_CARE_PROVIDER_SITE_OTHER): Payer: Medicaid Other | Admitting: Women's Health

## 2018-03-20 ENCOUNTER — Encounter: Payer: Self-pay | Admitting: Women's Health

## 2018-03-20 VITALS — BP 132/82 | HR 76 | Wt 180.0 lb

## 2018-03-20 DIAGNOSIS — O10913 Unspecified pre-existing hypertension complicating pregnancy, third trimester: Secondary | ICD-10-CM | POA: Diagnosis not present

## 2018-03-20 DIAGNOSIS — Z331 Pregnant state, incidental: Secondary | ICD-10-CM

## 2018-03-20 DIAGNOSIS — O0993 Supervision of high risk pregnancy, unspecified, third trimester: Secondary | ICD-10-CM

## 2018-03-20 DIAGNOSIS — Z3483 Encounter for supervision of other normal pregnancy, third trimester: Secondary | ICD-10-CM | POA: Diagnosis not present

## 2018-03-20 DIAGNOSIS — Z3A36 36 weeks gestation of pregnancy: Secondary | ICD-10-CM

## 2018-03-20 DIAGNOSIS — O10919 Unspecified pre-existing hypertension complicating pregnancy, unspecified trimester: Secondary | ICD-10-CM | POA: Diagnosis not present

## 2018-03-20 DIAGNOSIS — Z1389 Encounter for screening for other disorder: Secondary | ICD-10-CM

## 2018-03-20 LAB — POCT URINALYSIS DIPSTICK OB
Glucose, UA: NEGATIVE
Ketones, UA: NEGATIVE
Leukocytes, UA: NEGATIVE
Nitrite, UA: NEGATIVE
POC,PROTEIN,UA: NEGATIVE

## 2018-03-20 LAB — OB RESULTS CONSOLE GC/CHLAMYDIA: Gonorrhea: NEGATIVE

## 2018-03-20 NOTE — Patient Instructions (Addendum)
Natasha Bean, I greatly value your feedback.  If you receive a survey following your visit with [place] today, we appreciate you taking the time to fill it out.  Thanks, Joellyn Haff, CNM, WHNP-BC   Call the office [phone]) or go to [facility] if:  You begin to have strong, frequent contractions  Your water breaks.  Sometimes it is a big gush of fluid, sometimes it is just a trickle that keeps getting your panties wet or running down your legs  You have vaginal bleeding.  It is normal to have a small amount of spotting if your cervix was checked.   You don't feel your baby moving like normal.  If you don't, get you something to eat and drink and lay down and focus on feeling your baby move.  You should feel at least 10 movements in 2 hours.  If you don't, you should call the office or go to [facility].  Call the office [phone]) or go to [facility] hospital for these signs of pre-eclampsia:  Severe headache that does not go away with Tylenol  Visual changes- seeing spots, double, blurred vision  Pain under your right breast or upper abdomen that does not go away with Tums or heartburn medicine  Nausea and/or vomiting  Severe swelling in your hands, feet, and face         Preterm Labor and Birth Information  The normal length of a pregnancy is 39-41 weeks. Preterm labor is when labor starts before 37 completed weeks of pregnancy. What are the risk factors for preterm labor? Preterm labor is more likely to occur in women who:  Have certain infections during pregnancy such as a bladder infection, sexually transmitted infection, or infection inside the uterus (chorioamnionitis).  Have a shorter-than-normal cervix.  Have gone into preterm labor before.  Have had surgery on their cervix.  Are younger than age 46 or older than age 28.  Are African American.  Are pregnant with twins or multiple babies (multiple gestation).  Take street drugs or smoke while  pregnant.  Do not gain enough weight while pregnant.  Became pregnant shortly after having been pregnant. What are the symptoms of preterm labor? Symptoms of preterm labor include:  Cramps similar to those that can happen during a menstrual period. The cramps may happen with diarrhea.  Pain in the abdomen or lower back.  Regular uterine contractions that may feel like tightening of the abdomen.  A feeling of increased pressure in the pelvis.  Increased watery or bloody mucus discharge from the vagina.  Water breaking (ruptured amniotic sac). Why is it important to recognize signs of preterm labor? It is important to recognize signs of preterm labor because babies who are born prematurely may not be fully developed. This can put them at an increased risk for:  Long-term (chronic) heart and lung problems.  Difficulty immediately after birth with regulating body systems, including blood sugar, body temperature, heart rate, and breathing rate.  Bleeding in the brain.  Cerebral palsy.  Learning difficulties.  Death. These risks are highest for babies who are born before 34 weeks of pregnancy. How is preterm labor treated? Treatment depends on the length of your pregnancy, your condition, and the health of your baby. It may involve:  Having a stitch (suture) placed in your cervix to prevent your cervix from opening too early (cerclage).  Taking or being given medicines, such as: ? Hormone medicines. These may be given early in pregnancy to help support  the pregnancy. ? Medicine to stop contractions. ? Medicines to help mature the baby's lungs. These may be prescribed if the risk of delivery is high. ? Medicines to prevent your baby from developing cerebral palsy. If the labor happens before 34 weeks of pregnancy, you may need to stay in the hospital. What should I do if I think I am in preterm labor? If you think that you are going into preterm labor, call your health care  provider right away. How can I prevent preterm labor in future pregnancies? To increase your chance of having a full-term pregnancy:  Do not use any tobacco products, such as cigarettes, chewing tobacco, and e-cigarettes. If you need help quitting, ask your health care provider.  Do not use street drugs or medicines that have not been prescribed to you during your pregnancy.  Talk with your health care provider before taking any herbal supplements, even if you have been taking them regularly.  Make sure you gain a healthy amount of weight during your pregnancy.  Watch for infection. If you think that you might have an infection, get it checked right away.  Make sure to tell your health care provider if you have gone into preterm labor before. This information is not intended to replace advice given to you by your health care provider. Make sure you discuss any questions you have with your health care provider. Document Released: 05/19/2003 Document Revised: 08/09/2015 Document Reviewed: 07/20/2015 Elsevier Interactive Patient Education  2019 [employer].

## 2018-03-20 NOTE — Progress Notes (Signed)
HIGH-RISK PREGNANCY VISIT Patient name: Natasha Bean MRN [id number]  Date of birth: 01/04/1986 Chief Complaint:   High Risk Gestation (NST/ threw up this morning)  History of Present Illness:   Natasha Bean is a 33 y.o. G10P1001 female at [redacted]w[redacted]d with an Estimated Date of Delivery: 04/14/18 being seen today for ongoing management of a high-risk pregnancy complicated by [facility] on Labetalol 200mg  daily, h/o pre-e and pp HELLP.  Today she reports nauseated, vomited this morning. No diarrhea. Occ headache- improves w/ apap, occ sees spots- none currently, tight feeling upper abdomen/epigastric area. Has not taken by meds yet this am d/t n/v. Usually takes w/ breakfast. Contractions: Not present. Vag. Bleeding: None.  Movement: Present. denies leaking of fluid.  Review of Systems:   Pertinent items are noted in HPI Denies abnormal vaginal discharge w/ itching/odor/irritation, headaches, visual changes, shortness of breath, chest pain, abdominal pain, severe nausea/vomiting, or problems with urination or bowel movements unless otherwise stated above. Pertinent History Reviewed:  Reviewed past medical,surgical, social, obstetrical and family history.  Reviewed problem list, medications and allergies. Physical Assessment:   Vitals:   03/20/18 0932 03/20/18 1305  BP: 138/87 132/82  Pulse: 76   Weight: 180 lb (81.6 kg)   Body mass index is 32.92 kg/m.           Physical Examination:   General appearance: alert, well appearing, and in no distress  Mental status: alert, oriented to person, place, and time  Skin: warm & dry   Extremities: Edema: Trace    Cardiovascular: normal heart rate noted  Respiratory: normal respiratory effort, no distress  Abdomen: gravid, soft, non-tender  Pelvic: Cervical exam performed  Dilation: Fingertip Effacement (%): Thick Station: -3 ? Small ~ 0.5cm cyst on cx felt w/ SVE  Fetal Status: Fetal Heart Rate (bpm): 120 Fundal Height: 37 cm Movement: Present  Presentation: Vertex  Fetal Surveillance Testing today: NST: Initially non-reactive (10x10s only) but w/ good variability, changed positions, gave apple juice while waiting to work in for BPP and became reactive FHR baseline 120 bpm, Variability: moderate, Accelerations:present, Decelerations:  Absent= Cat 1/Reactive Toco: irregular, mild     Results for orders placed or performed in visit on 03/20/18 (from the past 24 hour(s))  POC Urinalysis Dipstick OB   Collection Time: 03/20/18  9:34 AM  Result Value Ref Range   Color, UA     Clarity, UA     Glucose, UA Negative Negative   Bilirubin, UA     Ketones, UA neg    Spec Grav, UA     Blood, UA small    pH, UA     POC,PROTEIN,UA Negative Negative, Trace, Small (1+), Moderate (2+), Large (3+), 4+   Urobilinogen, UA     Nitrite, UA neg    Leukocytes, UA Negative Negative   Appearance     Odor      Assessment & Plan:  1) High-risk pregnancy G2P1001 at [redacted]w[redacted]d with an Estimated Date of Delivery: 04/14/18   2) CHTN, stable, on labetalol 200mg  daily, hasn't taken this am, and bp's normal  3) H/O pre-e and pp HELLP, bp normal, no proteinuria, has had some intermittent headaches, seeing spots, epigastric pain. Will check labs. Thoroughly reviewed pre-e s/s, reasons to seek care, gave printed info.   Meds: No orders of the defined types were placed in this encounter.  Labs/procedures today: nst, gbs, gc/ct, sve  Treatment Plan:  2x/wk testing nst/sono       Deliver @  39wks  Reviewed: Preterm labor symptoms and general obstetric precautions including but not limited to vaginal bleeding, contractions, leaking of fluid and fetal movement were reviewed in detail with the patient.  All questions were answered.  Follow-up: Return for Mon hrob, bpp/dopp u/s; thurs hrob/nst (through 1/23).  Orders Placed This Encounter  Procedures  . Strep Gp B NAA  . GC/Chlamydia Probe Amp  . US FETAL BPP WO NON STRESS  . US UA Cord Doppler  . CBC  .  Comprehensive metabolic panel  . Protein / creatinine ratio, urine  . POC Urinalysis Dipstick OB   Cheral MarkerKimberly R Calea Hribar CNM, [facility]NP-BC 03/20/2018 1:50 PM

## 2018-03-21 LAB — COMPREHENSIVE METABOLIC PANEL
A/G RATIO: 1.4 (ref 1.2–2.2)
ALT: 17 IU/L (ref 0–32)
AST: 32 IU/L (ref 0–40)
Albumin: 3.7 g/dL (ref 3.5–5.5)
Alkaline Phosphatase: 144 IU/L — ABNORMAL HIGH (ref 39–117)
BUN/Creatinine Ratio: 9 (ref 9–23)
BUN: 7 mg/dL (ref 6–20)
Bilirubin Total: 0.6 mg/dL (ref 0.0–1.2)
CO2: 19 mmol/L — ABNORMAL LOW (ref 20–29)
Calcium: 9.1 mg/dL (ref 8.7–10.2)
Chloride: 102 mmol/L (ref 96–106)
Creatinine, Ser: 0.8 mg/dL (ref 0.57–1.00)
GFR calc Af Amer: 112 mL/min/{1.73_m2} (ref >59)
GFR calc non Af Amer: 97 mL/min/{1.73_m2} (ref >59)
Globulin, Total: 2.6 g/dL (ref 1.5–4.5)
Glucose: 83 mg/dL (ref 65–99)
POTASSIUM: 4.4 mmol/L (ref 3.5–5.2)
Sodium: 143 mmol/L (ref 134–144)
Total Protein: 6.3 g/dL (ref 6.0–8.5)

## 2018-03-21 LAB — PROTEIN / CREATININE RATIO, URINE
Creatinine, Urine: 62.1 mg/dL
Protein, Ur: 14.6 mg/dL
Protein/Creat Ratio: 235 mg/g creat — ABNORMAL HIGH (ref 0–200)

## 2018-03-21 LAB — CBC
HEMATOCRIT: 29.2 % — AB (ref 34.0–46.6)
Hemoglobin: 9.7 g/dL — ABNORMAL LOW (ref 11.1–15.9)
MCH: 29.9 pg (ref 26.6–33.0)
MCHC: 33.2 g/dL (ref 31.5–35.7)
MCV: 90 fL (ref 79–97)
Platelets: 313 10*3/uL (ref 150–450)
RBC: 3.24 x10E6/uL — ABNORMAL LOW (ref 3.77–5.28)
RDW: 12.4 % (ref 11.7–15.4)
WBC: 9.1 10*3/uL (ref 3.4–10.8)

## 2018-03-22 LAB — STREP GP B NAA: Strep Gp B NAA: NEGATIVE

## 2018-03-23 ENCOUNTER — Emergency Department (HOSPITAL_COMMUNITY)
Admission: EM | Admit: 2018-03-23 | Discharge: 2018-03-23 | Disposition: A | Discharge status: Home / Self Care | Payer: Medicaid Other | Attending: Emergency Medicine | Admitting: Emergency Medicine

## 2018-03-23 ENCOUNTER — Encounter (HOSPITAL_COMMUNITY): Payer: Self-pay

## 2018-03-23 ENCOUNTER — Other Ambulatory Visit: Payer: Self-pay

## 2018-03-23 DIAGNOSIS — O23599 Infection of other part of genital tract in pregnancy, unspecified trimester: Secondary | ICD-10-CM

## 2018-03-23 DIAGNOSIS — Z3A36 36 weeks gestation of pregnancy: Secondary | ICD-10-CM | POA: Diagnosis not present

## 2018-03-23 DIAGNOSIS — O23593 Infection of other part of genital tract in pregnancy, third trimester: Secondary | ICD-10-CM | POA: Diagnosis not present

## 2018-03-23 DIAGNOSIS — O99333 Smoking (tobacco) complicating pregnancy, third trimester: Secondary | ICD-10-CM | POA: Diagnosis not present

## 2018-03-23 DIAGNOSIS — Z79899 Other long term (current) drug therapy: Secondary | ICD-10-CM | POA: Diagnosis not present

## 2018-03-23 DIAGNOSIS — O9989 Other specified diseases and conditions complicating pregnancy, childbirth and the puerperium: Secondary | ICD-10-CM | POA: Diagnosis present

## 2018-03-23 DIAGNOSIS — Z7982 Long term (current) use of aspirin: Secondary | ICD-10-CM | POA: Diagnosis not present

## 2018-03-23 DIAGNOSIS — B9689 Other specified bacterial agents as the cause of diseases classified elsewhere: Secondary | ICD-10-CM

## 2018-03-23 DIAGNOSIS — F17210 Nicotine dependence, cigarettes, uncomplicated: Secondary | ICD-10-CM | POA: Diagnosis not present

## 2018-03-23 DIAGNOSIS — O99513 Diseases of the respiratory system complicating pregnancy, third trimester: Secondary | ICD-10-CM | POA: Diagnosis not present

## 2018-03-23 DIAGNOSIS — J45909 Unspecified asthma, uncomplicated: Secondary | ICD-10-CM | POA: Insufficient documentation

## 2018-03-23 DIAGNOSIS — Z3A37 37 weeks gestation of pregnancy: Secondary | ICD-10-CM | POA: Diagnosis not present

## 2018-03-23 LAB — CBC WITH DIFFERENTIAL/PLATELET
Abs Immature Granulocytes: 0.06 10*3/uL (ref 0.00–0.07)
Basophils Absolute: 0 10*3/uL (ref 0.0–0.1)
Basophils Relative: 0 %
Eosinophils Absolute: 0.2 10*3/uL (ref 0.0–0.5)
Eosinophils Relative: 3 %
HEMATOCRIT: 25.7 % — AB (ref 36.0–46.0)
Hemoglobin: 8.1 g/dL — ABNORMAL LOW (ref 12.0–15.0)
Immature Granulocytes: 1 %
Lymphocytes Relative: 29 %
Lymphs Abs: 2.3 10*3/uL (ref 0.7–4.0)
MCH: 29.3 pg (ref 26.0–34.0)
MCHC: 31.5 g/dL (ref 30.0–36.0)
MCV: 93.1 fL (ref 80.0–100.0)
MONO ABS: 0.7 10*3/uL (ref 0.1–1.0)
MONOS PCT: 8 %
Neutro Abs: 4.8 10*3/uL (ref 1.7–7.7)
Neutrophils Relative %: 59 %
Platelets: 263 10*3/uL (ref 150–400)
RBC: 2.76 MIL/uL — ABNORMAL LOW (ref 3.87–5.11)
RDW: 13.9 % (ref 11.5–15.5)
WBC: 8.1 10*3/uL (ref 4.0–10.5)
nRBC: 0 % (ref 0.0–0.2)

## 2018-03-23 LAB — WET PREP, GENITAL
Sperm: NONE SEEN
Trich, Wet Prep: NONE SEEN
Yeast Wet Prep HPF POC: NONE SEEN

## 2018-03-23 LAB — URINALYSIS, ROUTINE W REFLEX MICROSCOPIC
Bilirubin Urine: NEGATIVE
GLUCOSE, UA: NEGATIVE mg/dL
Ketones, ur: NEGATIVE mg/dL
Leukocytes, UA: NEGATIVE
Nitrite: NEGATIVE
Protein, ur: NEGATIVE mg/dL
Specific Gravity, Urine: 1.012 (ref 1.005–1.030)
pH: 5 (ref 5.0–8.0)

## 2018-03-23 LAB — COMPREHENSIVE METABOLIC PANEL
ALT: 18 U/L (ref 0–44)
AST: 23 U/L (ref 15–41)
Albumin: 2.7 g/dL — ABNORMAL LOW (ref 3.5–5.0)
Alkaline Phosphatase: 111 U/L (ref 38–126)
Anion gap: 8 (ref 5–15)
BUN: 15 mg/dL (ref 6–20)
CO2: 20 mmol/L — ABNORMAL LOW (ref 22–32)
CREATININE: 0.88 mg/dL (ref 0.44–1.00)
Calcium: 8.2 mg/dL — ABNORMAL LOW (ref 8.9–10.3)
Chloride: 109 mmol/L (ref 98–111)
GFR calc Af Amer: >60 mL/min (ref >60)
GFR calc non Af Amer: >60 mL/min (ref >60)
Glucose, Bld: 94 mg/dL (ref 70–99)
Potassium: 3.6 mmol/L (ref 3.5–5.1)
Sodium: 137 mmol/L (ref 135–145)
Total Bilirubin: 0.7 mg/dL (ref 0.3–1.2)
Total Protein: 5.7 g/dL — ABNORMAL LOW (ref 6.5–8.1)

## 2018-03-23 LAB — GC/CHLAMYDIA PROBE AMP
Chlamydia trachomatis, NAA: NEGATIVE
Neisseria gonorrhoeae by PCR: NEGATIVE

## 2018-03-23 MED ORDER — METRONIDAZOLE 500 MG PO TABS: 500.0000 mg | ORAL_TABLET | Freq: Two times a day (BID) | ORAL | 0 refills | Status: DC

## 2018-03-23 MED ORDER — SODIUM CHLORIDE 0.9 % IV BOLUS
1000.0000 mL | Freq: Once | INTRAVENOUS | Status: AC
  Administered 2018-03-23: 1000 mL via INTRAVENOUS

## 2018-03-23 NOTE — Progress Notes (Signed)
Report given to Janit Bern RN.

## 2018-03-23 NOTE — ED Triage Notes (Addendum)
Pt reports abdominal pressure and burning in lower abdomen and burning. Pt will be [redacted] weeks pregnant tomorrow. Pt reports working all day and unable to take pain . Reports white discharge. Pt of family tree

## 2018-03-23 NOTE — ED Provider Notes (Signed)
[facility] EMERGENCY DEPARTMENT Provider Note   CSN: [id number] Arrival date & time: 03/23/18  1813     History   Chief Complaint Chief Complaint  Patient presents with  . Abdominal Pain    HPI Neysa BonitoChristy A Can is a 33 y.o. female.  HPI  Pt was seen at 1915. Per pt, c/o gradual onset and persistence of constant lower abd "pressure" that she noticed this morning when she woke up. Pt also states it "burns down between my legs" with "white" vaginal discharge. Hx G2P1001, EGA 1643w6d, EDD 04/14/2018. Denies LOF, no contractions, no vaginal bleeding, no N/V/D, no back pain, no upper abd pain, no rash, no fevers.    Past Medical History:  Diagnosis Date  . Anemia   . Asthma   . Contraceptive management 06/10/2015  . Hematuria 01/06/2015  . History of marijuana use 11/01/2014   Repeat UDS- Neg (04/19/2015)  . Migraine   . Pregnancy induced hypertension   . Round ligament pain 01/06/2015  . Trichimoniasis 01/06/2015   Repeat neg- 01/17/15  . Urinary frequency 01/06/2015  . Vaginal discharge during pregnancy in second trimester 01/06/2015    Patient Active Problem List   Diagnosis Date Noted  . H/O preeclampsia then postpartum HELLP 01/17/2018  . Supervision of high risk pregnancy, antepartum 10/09/2017  . Smoker 10/09/2017  . Chronic hypertension affecting pregnancy 10/09/2017  . Anemia affecting second pregnancy 02/14/2015    Past Surgical History:  Procedure Laterality Date  . NO PAST SURGERIES       OB History    Gravida  2   Para  1   Term  1   Preterm      AB      Living  1     SAB      TAB      Ectopic      Multiple  0   Live Births  1            Home Medications    Prior to Admission medications   Medication Sig Start Date End Date Taking? Authorizing Provider  acetaminophen (TYLENOL) 500 MG tablet Take 1,000 mg by mouth 2 (two) times daily as needed.   Yes [provider]  albuterol (PROVENTIL HFA;VENTOLIN HFA) 108 (90 Base)  MCG/ACT inhaler Inhale into the lungs every 6 (six) hours as needed for wheezing or shortness of breath.   Yes [provider]  aspirin EC 81 MG tablet Take 2 tablets (162 mg total) by mouth daily. 10/09/17  Yes Cresenzo-Dishmon, Scarlette CalicoFrances, CNM  Doxylamine-Pyridoxine (DICLEGIS) 10-10 MG TBEC 2 tabs q hs, if sx persist add 1 tab q am on day 3, if sx persist add 1 tab q afternoon on day 4 10/01/17  Yes Booker, Merlene LaughterKimberly R, CNM  Ferrous Sulfate Dried (FERROUS SULFATE IRON) 200 (65 Fe) MG TABS Take 1 tablet by mouth 2 (two) times daily. 12/20/17  Yes Lazaro ArmsEure, Luther H, MD  labetalol (NORMODYNE) 200 MG tablet Take 1 tablet (200 mg total) by mouth 2 (two) times daily. Patient taking differently: Take 200 mg by mouth once.  10/14/17  Yes Cheral MarkerBooker, Kimberly R, CNM  omeprazole (PRILOSEC) 20 MG capsule Take 1 capsule (20 mg total) by mouth daily. 1 tablet a day 12/20/17  Yes Lazaro ArmsEure, Luther H, MD  Prenatal Vit-Fe Fumarate-FA (PRENATAL COMPLETE) 14-0.4 MG TABS Take 1 tablet by mouth daily. 09/04/17  Yes Zadie RhineWickline, Donald, MD  sertraline (ZOLOFT) 25 MG tablet Take 1 tablet (25 mg total) by mouth  daily. 10/24/17  Yes Cresenzo-Dishmon, Scarlette Calico, CNM  silver sulfADIAZINE (SILVADENE) 1 % cream Use to area 3-4 times daily 03/13/18  Yes Eure, Amaryllis Dyke, MD    Family History Family History  Problem Relation Age of Onset  . Cancer Mother        throat  . Hypertension Father   . Diabetes Father   . Cancer Father        colon  . Heart disease Father   . Thyroid disease Father   . Stroke Father   . Kidney failure Father   . Heart disease Paternal Grandmother   . Hypertension Paternal Grandmother   . Diabetes Paternal Grandmother   . Seizures Daughter     Social History Social History   Tobacco Use  . Smoking status: Current Every Day Smoker    Packs/day: 0.25    Years: 14.00    Pack years: 3.50    Types: Cigarettes  . Smokeless tobacco: Never Used  . Tobacco comment: 2-3 per day  Substance Use Topics  .  Alcohol use: Not Currently    Comment: wine sometimes  . Drug use: Not Currently    Types: Marijuana    Comment: last used 1 month ago     Allergies   Benadryl [diphenhydramine hcl] and Fioricet [butalbital-apap-caffeine]   Review of Systems Review of Systems ROS: Statement: All systems negative except as marked or noted in the HPI; Constitutional: Negative for fever and chills. ; ; Eyes: Negative for eye pain, redness and discharge. ; ; ENMT: Negative for ear pain, hoarseness, nasal congestion, sinus pressure and sore throat. ; ; Cardiovascular: Negative for chest pain, palpitations, diaphoresis, dyspnea and peripheral edema. ; ; Respiratory: Negative for cough, wheezing and stridor. ; ; Gastrointestinal: Negative for nausea, vomiting, diarrhea, abdominal pain, blood in stool, hematemesis, jaundice and rectal bleeding. . ; ; Genitourinary: Negative for dysuria, flank pain and hematuria. ; ; GYN:  +suprapubic pelvic pain, no vaginal bleeding, +vaginal discharge, no vulvar pain. ;; Musculoskeletal: Negative for back pain and neck pain. Negative for swelling and trauma.; ; Skin: Negative for pruritus, rash, abrasions, blisters, bruising and skin lesion.; ; Neuro: Negative for headache, lightheadedness and neck stiffness. Negative for weakness, altered level of consciousness, altered mental status, extremity weakness, paresthesias, involuntary movement, seizure and syncope.       Physical Exam Updated Vital Signs BP 132/83   Pulse 71   Temp 97.8 F (36.6 C) (Oral)   Resp 20   LMP 08/04/2017 (Approximate)   SpO2 100%    BP (!) 131/91 (BP Location: Left Arm)   Pulse 78   Temp 97.8 F (36.6 C) (Oral)   Resp 18   LMP 08/04/2017 (Approximate)   SpO2 100%    Physical Exam 1920: Physical examination:  Nursing notes reviewed; Vital signs and O2 SAT reviewed;  Constitutional: Well developed, Well nourished, Well hydrated, In no acute distress; Head:  Normocephalic, atraumatic; Eyes: EOMI,  PERRL, No scleral icterus; ENMT: Mouth and pharynx normal, Mucous membranes moist; Neck: Supple, Full range of motion, No lymphadenopathy; Cardiovascular: Regular rate and rhythm, No gallop; Respiratory: Breath sounds clear & equal bilaterally, No wheezes.  Speaking full sentences with ease, Normal respiratory effort/excursion; Chest: Nontender, Movement normal; Abdomen: Soft, +gravid. Nontender, Nondistended, Normal bowel sounds; Genitourinary: No CVA tenderness. SVE performed with permission of pt and female ED RN assist during exam:  External genitalia w/o lesions. Vaginal vault with thin clear discharge, no bleeding. Wet prep obtained and sent to lab. Cervix  fingertip/posterior..;;; Extremities: Peripheral pulses normal, No tenderness, +tr pedal edema bilat. No calf edema or asymmetry.; Neuro: AA&Ox3, Major CN grossly intact.  Speech clear. No gross focal motor or sensory deficits in extremities. Climbs on and off stretcher easily by herself. Gait steady..; Skin: Color normal, Warm, Dry.   ED Treatments / Results  Labs (all labs ordered are listed, but only abnormal results are displayed)   EKG None  Radiology   Procedures Procedures (including critical care time)  Medications Ordered in ED Medications  sodium chloride 0.9 % bolus 1,000 mL ( Intravenous Rate/Dose Verify 03/23/18 2008)     Initial Impression / Assessment and Plan / ED Course  I have reviewed the triage vital signs and the nursing notes.  Pertinent labs & imaging results that were available during my care of the patient were reviewed by me and considered in my medical decision making (see chart for details).  MDM Reviewed: previous chart, nursing note and vitals Reviewed previous: labs Interpretation: labs   Results for orders placed or performed during the hospital encounter of 03/23/18  Wet prep, genital  Result Value Ref Range   Yeast Wet Prep HPF POC NONE SEEN NONE SEEN   Trich, Wet Prep NONE SEEN NONE SEEN    Clue Cells Wet Prep HPF POC PRESENT (A) NONE SEEN   WBC, Wet Prep HPF POC RARE (A) NONE SEEN   Sperm NONE SEEN   Urinalysis, Routine w reflex microscopic  Result Value Ref Range   Color, Urine YELLOW YELLOW   APPearance HAZY (A) CLEAR   Specific Gravity, Urine 1.012 1.005 - 1.030   pH 5.0 5.0 - 8.0   Glucose, UA NEGATIVE NEGATIVE mg/dL   Hgb urine dipstick SMALL (A) NEGATIVE   Bilirubin Urine NEGATIVE NEGATIVE   Ketones, ur NEGATIVE NEGATIVE mg/dL   Protein, ur NEGATIVE NEGATIVE mg/dL   Nitrite NEGATIVE NEGATIVE   Leukocytes, UA NEGATIVE NEGATIVE   RBC / HPF 0-5 0 - 5 RBC/hpf   WBC, UA 0-5 0 - 5 WBC/hpf   Bacteria, UA RARE (A) NONE SEEN   Squamous Epithelial / LPF 0-5 0 - 5   Mucus PRESENT   CBC with Differential  Result Value Ref Range   WBC 8.1 4.0 - 10.5 K/uL   RBC 2.76 (L) 3.87 - 5.11 MIL/uL   Hemoglobin 8.1 (L) 12.0 - 15.0 g/dL   HCT 16.125.7 (L) 09.636.0 - 04.546.0 %   MCV 93.1 80.0 - 100.0 fL   MCH 29.3 26.0 - 34.0 pg   MCHC 31.5 30.0 - 36.0 g/dL   RDW 40.913.9 81.111.5 - 91.415.5 %   Platelets 263 150 - 400 K/uL   nRBC 0.0 0.0 - 0.2 %   Neutrophils Relative % 59 %   Neutro Abs 4.8 1.7 - 7.7 K/uL   Lymphocytes Relative 29 %   Lymphs Abs 2.3 0.7 - 4.0 K/uL   Monocytes Relative 8 %   Monocytes Absolute 0.7 0.1 - 1.0 K/uL   Eosinophils Relative 3 %   Eosinophils Absolute 0.2 0.0 - 0.5 K/uL   Basophils Relative 0 %   Basophils Absolute 0.0 0.0 - 0.1 K/uL   Immature Granulocytes 1 %   Abs Immature Granulocytes 0.06 0.00 - 0.07 K/uL  Comprehensive metabolic panel  Result Value Ref Range   Sodium 137 135 - 145 mmol/L   Potassium 3.6 3.5 - 5.1 mmol/L   Chloride 109 98 - 111 mmol/L   CO2 20 (L) 22 - 32 mmol/L   Glucose,  Bld 94 70 - 99 mg/dL   BUN 15 6 - 20 mg/dL   Creatinine, Ser 4.08 0.44 - 1.00 mg/dL   Calcium 8.2 (L) 8.9 - 10.3 mg/dL   Total Protein 5.7 (L) 6.5 - 8.1 g/dL   Albumin 2.7 (L) 3.5 - 5.0 g/dL   AST 23 15 - 41 U/L   ALT 18 0 - 44 U/L   Alkaline Phosphatase 111 38 -  126 U/L   Total Bilirubin 0.7 0.3 - 1.2 mg/dL   GFR calc non Af Amer >60 >60 mL/min   GFR calc Af Amer >60 >60 mL/min   Anion gap 8 5 - 15    2050:  [facility] has been monitoring pt on toco/FHM: states baby looks good, no contractions, OK to d/c.  T/C returned from OB/GYN Dr. Despina Hidden, case discussed, including:  HPI, pertinent PM/SHx, VS/PE, dx testing, ED course and treatment:  OK to d/c, agrees tx for BV, have pt f/u in office tomorrow as previously scheduled. Dx and testing, as well as d/w OB/GYN MD, d/w pt.  Questions answered.  Verb understanding, agreeable to d/c home with outpt f/u.    Final Clinical Impressions(s) / ED Diagnoses   Final diagnoses:  None    ED Discharge Orders    None       Samuel Jester, DO 03/27/18 1152

## 2018-03-23 NOTE — Progress Notes (Signed)
RROB spoke with MD Eure informing him pt was at [facility] with complaints of abd pressure, lower abdominal burning, and white d/c. Informed MD orders for UA and U/Culture have been ordered, FHR tracing is reactive with uterine irritability. Pt was 0.5/thick/-3 on 03/20/2018. RROB will contact [facility] to receive pt update; from an OB POV pt is cleared.

## 2018-03-23 NOTE — Progress Notes (Signed)
RROB spoke with Ginger, RN caring for pt. Informed RN that pt is cleared from an OB POV. FHR tracing remains reactive with slight uterine irritability. MD at [facility] checked pt's cervix, still 0.5 cm.

## 2018-03-23 NOTE — Progress Notes (Addendum)
Received call from [facility]. Pt is a G2P1 at 17 6/[redacted] weeks gestation presenting with c/o lower abd pressure that started this morning. Says pt denies vaginal bleeding or leaking of fluid. Says pt's first delivery was vaginal at [redacted] weeks gestation. Pt gets her care at [facility].

## 2018-03-23 NOTE — Discharge Instructions (Addendum)
Take the prescription as directed. Avoid strenuous activity and do NOT place anything into your vagina, ie: no douching, no tampons, no sexual intercourse, no swimming or tub baths, until you are seen in follow up by your regular OB/GYN doctor.  Go to your regular OB/GYN doctor tomorrow as previously scheduled to be seen in follow up.  Return to the Emergency Department immediately if worsening.

## 2018-03-23 NOTE — Progress Notes (Signed)
FHR tracing belongs to a pt that had not been discharged out of their system. Pt Natasha Bean was discharged  And now we are veiwing Natasha Bean's FHR tracing.

## 2018-03-24 ENCOUNTER — Ambulatory Visit (INDEPENDENT_AMBULATORY_CARE_PROVIDER_SITE_OTHER): Payer: Medicaid Other | Admitting: Women's Health

## 2018-03-24 ENCOUNTER — Encounter: Payer: Self-pay | Admitting: Women's Health

## 2018-03-24 VITALS — BP 128/79 | HR 68 | Wt 181.0 lb

## 2018-03-24 DIAGNOSIS — O10913 Unspecified pre-existing hypertension complicating pregnancy, third trimester: Secondary | ICD-10-CM

## 2018-03-24 DIAGNOSIS — O0993 Supervision of high risk pregnancy, unspecified, third trimester: Secondary | ICD-10-CM | POA: Diagnosis not present

## 2018-03-24 DIAGNOSIS — O99019 Anemia complicating pregnancy, unspecified trimester: Secondary | ICD-10-CM

## 2018-03-24 DIAGNOSIS — Z3A37 37 weeks gestation of pregnancy: Secondary | ICD-10-CM

## 2018-03-24 DIAGNOSIS — Z1389 Encounter for screening for other disorder: Secondary | ICD-10-CM

## 2018-03-24 DIAGNOSIS — Z331 Pregnant state, incidental: Secondary | ICD-10-CM

## 2018-03-24 DIAGNOSIS — O10919 Unspecified pre-existing hypertension complicating pregnancy, unspecified trimester: Secondary | ICD-10-CM

## 2018-03-24 DIAGNOSIS — O99013 Anemia complicating pregnancy, third trimester: Secondary | ICD-10-CM

## 2018-03-24 LAB — POCT URINALYSIS DIPSTICK OB
Blood, UA: NEGATIVE
Glucose, UA: NEGATIVE
Ketones, UA: NEGATIVE
Leukocytes, UA: NEGATIVE
Nitrite, UA: NEGATIVE

## 2018-03-24 NOTE — Progress Notes (Signed)
HIGH-RISK PREGNANCY VISIT Patient name: Natasha Bean MRN [id number]  Date of birth: 1985-05-10 Chief Complaint:   High Risk Gestation (NST/ [facility]- contractions/ room# 12)  History of Present Illness:   Natasha Bean is a 33 y.o. G104P1001 female at [redacted]w[redacted]d with an Estimated Date of Delivery: 04/14/18 being seen today for ongoing management of a high-risk pregnancy complicated by [facility] on Labetalol 200mg  daily, h/o pre-e and pp HELLP.  Today she reports went to [facility] yesterday w/ pelvic pressure and vulvar burning and d/c, tx for BV, cx unchanged. Headache this am, hasn't eaten yet this am. Denies visual changes, ruq/epigastric pain. Hgb 8.1 yesterday, was 9.7 on Thurs. No active bleeding. Only taking Fe once daily instead of BID. Contractions: Not present.  .  Movement: Present. denies leaking of fluid.  Review of Systems:   Pertinent items are noted in HPI Denies abnormal vaginal discharge w/ itching/odor/irritation, headaches, visual changes, shortness of breath, chest pain, abdominal pain, severe nausea/vomiting, or problems with urination or bowel movements unless otherwise stated above. Pertinent History Reviewed:  Reviewed past medical,surgical, social, obstetrical and family history.  Reviewed problem list, medications and allergies. Physical Assessment:   Vitals:   03/24/18 1039  BP: 128/79  Pulse: 68  Weight: 181 lb (82.1 kg)  Body mass index is 33.11 kg/m.           Physical Examination:   General appearance: alert, well appearing, and in no distress  Mental status: alert, oriented to person, place, and time  Skin: warm & dry   Extremities: Edema: Trace    Cardiovascular: normal heart rate noted  Respiratory: normal respiratory effort, no distress  Abdomen: gravid, soft, non-tender  Pelvic: Cervical exam deferred         Fetal Status: Fetal Heart Rate (bpm): 135 Fundal Height: 38 cm Movement: Present   Fetal Surveillance Testing today: NST: FHR baseline  135 bpm, Variability: moderate, Accelerations:present, Decelerations:  Absent= Cat 1/Reactive Toco: irregular w/ ui, mild     Results for orders placed or performed in visit on 03/24/18 (from the past 24 hour(s))  POC Urinalysis Dipstick OB   Collection Time: 03/24/18 10:45 AM  Result Value Ref Range   Color, UA     Clarity, UA     Glucose, UA Negative Negative   Bilirubin, UA     Ketones, UA neg    Spec Grav, UA     Blood, UA neg    pH, UA     POC,PROTEIN,UA Trace Negative, Trace, Small (1+), Moderate (2+), Large (3+), 4+   Urobilinogen, UA     Nitrite, UA neg    Leukocytes, UA Negative Negative   Appearance     Odor    Results for orders placed or performed during the hospital encounter of 03/23/18 (from the past 24 hour(s))  CBC with Differential   Collection Time: 03/23/18  7:18 PM  Result Value Ref Range   WBC 8.1 4.0 - 10.5 K/uL   RBC 2.76 (L) 3.87 - 5.11 MIL/uL   Hemoglobin 8.1 (L) 12.0 - 15.0 g/dL   HCT 59.9 (L) 35.7 - 01.7 %   MCV 93.1 80.0 - 100.0 fL   MCH 29.3 26.0 - 34.0 pg   MCHC 31.5 30.0 - 36.0 g/dL   RDW 79.3 90.3 - 00.9 %   Platelets 263 150 - 400 K/uL   nRBC 0.0 0.0 - 0.2 %   Neutrophils Relative % 59 %   Neutro Abs 4.8 1.7 -  7.7 K/uL   Lymphocytes Relative 29 %   Lymphs Abs 2.3 0.7 - 4.0 K/uL   Monocytes Relative 8 %   Monocytes Absolute 0.7 0.1 - 1.0 K/uL   Eosinophils Relative 3 %   Eosinophils Absolute 0.2 0.0 - 0.5 K/uL   Basophils Relative 0 %   Basophils Absolute 0.0 0.0 - 0.1 K/uL   Immature Granulocytes 1 %   Abs Immature Granulocytes 0.06 0.00 - 0.07 K/uL  Comprehensive metabolic panel   Collection Time: 03/23/18  7:18 PM  Result Value Ref Range   Sodium 137 135 - 145 mmol/L   Potassium 3.6 3.5 - 5.1 mmol/L   Chloride 109 98 - 111 mmol/L   CO2 20 (L) 22 - 32 mmol/L   Glucose, Bld 94 70 - 99 mg/dL   BUN 15 6 - 20 mg/dL   Creatinine, Ser 1.61 0.44 - 1.00 mg/dL   Calcium 8.2 (L) 8.9 - 10.3 mg/dL   Total Protein 5.7 (L) 6.5 - 8.1 g/dL    Albumin 2.7 (L) 3.5 - 5.0 g/dL   AST 23 15 - 41 U/L   ALT 18 0 - 44 U/L   Alkaline Phosphatase 111 38 - 126 U/L   Total Bilirubin 0.7 0.3 - 1.2 mg/dL   GFR calc non Af Amer >60 >60 mL/min   GFR calc Af Amer >60 >60 mL/min   Anion gap 8 5 - 15  Wet prep, genital   Collection Time: 03/23/18  7:20 PM  Result Value Ref Range   Yeast Wet Prep HPF POC NONE SEEN NONE SEEN   Trich, Wet Prep NONE SEEN NONE SEEN   Clue Cells Wet Prep HPF POC PRESENT (A) NONE SEEN   WBC, Wet Prep HPF POC RARE (A) NONE SEEN   Sperm NONE SEEN   Urinalysis, Routine w reflex microscopic   Collection Time: 03/23/18  7:30 PM  Result Value Ref Range   Color, Urine YELLOW YELLOW   APPearance HAZY (A) CLEAR   Specific Gravity, Urine 1.012 1.005 - 1.030   pH 5.0 5.0 - 8.0   Glucose, UA NEGATIVE NEGATIVE mg/dL   Hgb urine dipstick SMALL (A) NEGATIVE   Bilirubin Urine NEGATIVE NEGATIVE   Ketones, ur NEGATIVE NEGATIVE mg/dL   Protein, ur NEGATIVE NEGATIVE mg/dL   Nitrite NEGATIVE NEGATIVE   Leukocytes, UA NEGATIVE NEGATIVE   RBC / HPF 0-5 0 - 5 RBC/hpf   WBC, UA 0-5 0 - 5 WBC/hpf   Bacteria, UA RARE (A) NONE SEEN   Squamous Epithelial / LPF 0-5 0 - 5   Mucus PRESENT     Assessment & Plan:  1) High-risk pregnancy G2P1001 at [redacted]w[redacted]d with an Estimated Date of Delivery: 04/14/18   2) CHTN, stable, continue Labetalol 200mg  daily  3) H/O pre-e and pp HELLP, bp's stable, labs normal Thursday- PCR 0.235, reviewed pre-e s/s, reasons to seek care, gave printed info  4) Anemia> discussed w/ Dr. Alvester Morin, will go ahead w/ IV Feraheme d/t being so near delivery, orders placed, Tish calling [facility] to schedule  Meds: No orders of the defined types were placed in this encounter.  Labs/procedures today: nst  Treatment Plan:  2x/wk testing nst alt w/ bpp/dopp, IOL @ 39wks  Reviewed: Term labor symptoms and general obstetric precautions including but not limited to vaginal bleeding, contractions, leaking of fluid and  fetal movement were reviewed in detail with the patient.  All questions were answered.  Follow-up: Return for As scheduled Thurs for hrob, bpp/dopp  u/s.  Orders Placed This Encounter  Procedures  . POC Urinalysis Dipstick OB   Cheral MarkerKimberly R Ashawna Hanback CNM, [facility] Center[facility]NP-BC 03/24/2018 12:42 PM

## 2018-03-24 NOTE — Patient Instructions (Signed)
Natasha Bean, I greatly value your feedback.  If you receive a survey following your visit with [place] today, we appreciate you taking the time to fill it out.  Thanks, Joellyn Haff, CNM, WHNP-BC   Call the office [phone]) or go to [facility] if:  You begin to have strong, frequent contractions  Your water breaks.  Sometimes it is a big gush of fluid, sometimes it is just a trickle that keeps getting your panties wet or running down your legs  You have vaginal bleeding.  It is normal to have a small amount of spotting if your cervix was checked.   You don't feel your baby moving like normal.  If you don't, get you something to eat and drink and lay down and focus on feeling your baby move.  You should feel at least 10 movements in 2 hours.  If you don't, you should call the office or go to [facility].   Call the office [phone]) or go to [facility] hospital for these signs of pre-eclampsia:  Severe headache that does not go away with Tylenol  Visual changes- seeing spots, double, blurred vision  Pain under your right breast or upper abdomen that does not go away with Tums or heartburn medicine  Nausea and/or vomiting  Severe swelling in your hands, feet, and face     Iron-Rich Diet  Iron is a mineral that helps your body to produce hemoglobin. Hemoglobin is a protein in red blood cells that carries oxygen to your body's tissues. Eating too little iron may cause you to feel weak and tired, and it can increase your risk of infection. Iron is naturally found in many foods, and many foods have iron added to them (iron-fortified foods). You may need to follow an iron-rich diet if you do not have enough iron in your body due to certain medical conditions. The amount of iron that you need each day depends on your age, your sex, and any medical conditions you have. Follow instructions from your health care provider or a diet and nutrition specialist (dietitian) about how much iron  you should eat each day. What are tips for following this plan? Reading food labels  Check food labels to see how many milligrams (mg) of iron are in each serving. Cooking  Cook foods in pots and pans that are made from iron.  Take these steps to make it easier for your body to absorb iron from certain foods: ? Soak beans overnight before cooking. ? Soak whole grains overnight and drain them before using. ? Ferment flours before baking, such as by using yeast in bread dough. Meal planning  When you eat foods that contain iron, you should eat them with foods that are high in vitamin C. These include oranges, peppers, tomatoes, potatoes, and mango. Vitamin C helps your body to absorb iron. General information  Take iron supplements only as told by your health care provider. An overdose of iron can be life-threatening. If you were prescribed iron supplements, take them with orange juice or a vitamin C supplement.  When you eat iron-fortified foods or take an iron supplement, you should also eat foods that naturally contain iron, such as meat, poultry, and fish. Eating naturally iron-rich foods helps your body to absorb the iron that is added to other foods or contained in a supplement.  Certain foods and drinks prevent your body from absorbing iron properly. Avoid eating these foods in the same meal as iron-rich foods or with  iron supplements. These foods include: ? Coffee, black tea, and red wine. ? Milk, dairy products, and foods that are high in calcium. ? Beans and soybeans. ? Whole grains. What foods should I eat? Fruits Prunes. Raisins. Eat fruits high in vitamin C, such as oranges, grapefruits, and strawberries, alongside iron-rich foods. Vegetables Spinach (cooked). Green peas. Broccoli. Fermented vegetables. Eat vegetables high in vitamin C, such as leafy greens, potatoes, bell peppers, and tomatoes, alongside iron-rich foods. Grains Iron-fortified breakfast cereal.  Iron-fortified whole-wheat bread. Enriched rice. Sprouted grains. Meats and other proteins Beef liver. Oysters. Beef. Shrimp. [place]. Chicken. Tuna. Sardines. Chickpeas. Nuts. Tofu. Pumpkin seeds. Beverages Tomato juice. Fresh orange juice. Prune juice. Hibiscus tea. Fortified instant breakfast shakes. Sweets and desserts Blackstrap molasses. Seasonings and condiments Tahini. Fermented soy sauce. Other foods Wheat germ. The items listed above may not be a complete list of recommended foods and beverages. Contact a dietitian for more information. What foods should I avoid? Grains Whole grains. Bran cereal. Bran flour. Oats. Meats and other proteins Soybeans. Products made from soy protein. Black beans. Lentils. Mung beans. Split peas. Dairy Milk. Cream. Cheese. Yogurt. Cottage cheese. Beverages Coffee. Black tea. Red wine. Sweets and desserts Cocoa. Chocolate. Ice cream. Other foods Basil. Oregano. Large amounts of parsley. The items listed above may not be a complete list of foods and beverages to avoid. Contact a dietitian for more information. Summary  Iron is a mineral that helps your body to produce hemoglobin. Hemoglobin is a protein in red blood cells that carries oxygen to your body's tissues.  Iron is naturally found in many foods, and many foods have iron added to them (iron-fortified foods).  When you eat foods that contain iron, you should eat them with foods that are high in vitamin C. Vitamin C helps your body to absorb iron.  Certain foods and drinks prevent your body from absorbing iron properly, such as whole grains and dairy products. You should avoid eating these foods in the same meal as iron-rich foods or with iron supplements. This information is not intended to replace advice given to you by your health care provider. Make sure you discuss any questions you have with your health care provider. Document Released: 10/10/2004 Document Revised: 01/22/2017  Document Reviewed: 01/22/2017 Elsevier Interactive Patient Education  2019 [employer].

## 2018-03-25 LAB — URINE CULTURE

## 2018-03-26 ENCOUNTER — Encounter (HOSPITAL_COMMUNITY)
Admission: RE | Admit: 2018-03-26 | Discharge: 2018-03-26 | Disposition: A | Discharge status: Home / Self Care | Payer: Medicaid Other | Source: Ambulatory Visit | Attending: Obstetrics & Gynecology | Admitting: Obstetrics & Gynecology

## 2018-03-26 DIAGNOSIS — O99013 Anemia complicating pregnancy, third trimester: Secondary | ICD-10-CM | POA: Insufficient documentation

## 2018-03-26 MED ORDER — SODIUM CHLORIDE 0.9 % IV SOLN
510.0000 mg | INTRAVENOUS | Status: DC
  Administered 2018-03-26: 510 mg via INTRAVENOUS
  Filled 2018-03-26: qty 510

## 2018-03-26 MED ORDER — SODIUM CHLORIDE 0.9 % IV SOLN
Freq: Once | INTRAVENOUS | Status: AC
  Administered 2018-03-26: 250 mL via INTRAVENOUS

## 2018-03-27 ENCOUNTER — Encounter: Payer: Self-pay | Admitting: Obstetrics & Gynecology

## 2018-03-27 ENCOUNTER — Other Ambulatory Visit: Payer: Self-pay

## 2018-03-27 ENCOUNTER — Ambulatory Visit (INDEPENDENT_AMBULATORY_CARE_PROVIDER_SITE_OTHER): Payer: Medicaid Other | Admitting: Obstetrics & Gynecology

## 2018-03-27 ENCOUNTER — Ambulatory Visit (INDEPENDENT_AMBULATORY_CARE_PROVIDER_SITE_OTHER): Payer: Medicaid Other

## 2018-03-27 VITALS — BP 138/90 | HR 85 | Wt 182.0 lb

## 2018-03-27 DIAGNOSIS — O10919 Unspecified pre-existing hypertension complicating pregnancy, unspecified trimester: Secondary | ICD-10-CM

## 2018-03-27 DIAGNOSIS — Z331 Pregnant state, incidental: Secondary | ICD-10-CM

## 2018-03-27 DIAGNOSIS — Z3A37 37 weeks gestation of pregnancy: Secondary | ICD-10-CM

## 2018-03-27 DIAGNOSIS — O10913 Unspecified pre-existing hypertension complicating pregnancy, third trimester: Secondary | ICD-10-CM

## 2018-03-27 DIAGNOSIS — O0990 Supervision of high risk pregnancy, unspecified, unspecified trimester: Secondary | ICD-10-CM

## 2018-03-27 DIAGNOSIS — O0993 Supervision of high risk pregnancy, unspecified, third trimester: Secondary | ICD-10-CM

## 2018-03-27 DIAGNOSIS — Z1389 Encounter for screening for other disorder: Secondary | ICD-10-CM

## 2018-03-27 LAB — POCT URINALYSIS DIPSTICK OB
Blood, UA: NEGATIVE
Glucose, UA: NEGATIVE
Ketones, UA: NEGATIVE
Leukocytes, UA: NEGATIVE
Nitrite, UA: NEGATIVE
POC,PROTEIN,UA: NEGATIVE

## 2018-03-27 NOTE — Progress Notes (Signed)
   HIGH-RISK PREGNANCY VISIT Patient name: Natasha NestleChristy A Lechuga MRN [id number]  Date of birth: 10/13/1985 Chief Complaint:   High Risk Gestation (u/s today)  History of Present Illness:   Natasha Bean is a 33 y.o. 212P1001 female at 9771w3d with an Estimated Date of Delivery: 04/14/18 being seen today for ongoing management of a high-risk pregnancy complicated by chronic HTN.  Today she reports no complaints. Contractions: Not present. Vag. Bleeding: None.  Movement: Present. denies leaking of fluid.  Review of Systems:   Pertinent items are noted in HPI Denies abnormal vaginal discharge w/ itching/odor/irritation, headaches, visual changes, shortness of breath, chest pain, abdominal pain, severe nausea/vomiting, or problems with urination or bowel movements unless otherwise stated above. Pertinent History Reviewed:  Reviewed past medical,surgical, social, obstetrical and family history.  Reviewed problem list, medications and allergies. Physical Assessment:   Vitals:   03/27/18 1232  BP: 138/90  Pulse: 85  Weight: 182 lb (82.6 kg)  Body mass index is 33.29 kg/m.           Physical Examination:   General appearance: alert, well appearing, and in no distress  Mental status: alert, oriented to person, place, and time  Skin: warm & dry   Extremities: Edema: Trace    Cardiovascular: normal heart rate noted  Respiratory: normal respiratory effort, no distress  Abdomen: gravid, soft, non-tender  Pelvic: Cervical exam deferred         Fetal Status:     Movement: Present    Fetal Surveillance Testing today: BPP 8/8 with normal Dopplers   No results found for this or any previous visit (from the past 24 hour(s)).  Assessment & Plan:  1) High-risk pregnancy G2P1001 at 6571w3d with an Estimated Date of Delivery: 04/14/18   2) CHTN, stable  3) Anemia, s/p IV feraheme x 2  Meds: No orders of the defined types were placed in this encounter.   Labs/procedures today: sonogram  normal  Treatment Plan:  Twice weekly surveillance, sonogram alternating with NST, induction at 39 weeks or as clinically indicated   Reviewed: Term labor symptoms and general obstetric precautions including but not limited to vaginal bleeding, contractions, leaking of fluid and fetal movement were reviewed in detail with the patient.  All questions were answered.  Follow-up: Return in about 4 days (around 03/31/2018) for NST, HROB.  Orders Placed This Encounter  Procedures  . POC Urinalysis Dipstick OB   Lazaro ArmsLuther H   04/06/2018 8:17 PM

## 2018-03-27 NOTE — Progress Notes (Signed)
[place] 37+3 wks,cephalic,anterior placenta gr 3,afi 13 cm,fhr 146 bpm,bilat adnexa's wnl,BPP 8/8,RI .72,.58,.55=78%

## 2018-03-31 ENCOUNTER — Ambulatory Visit (INDEPENDENT_AMBULATORY_CARE_PROVIDER_SITE_OTHER): Payer: Medicaid Other | Admitting: Obstetrics & Gynecology

## 2018-03-31 ENCOUNTER — Telehealth (HOSPITAL_COMMUNITY): Payer: Self-pay | Admitting: *Deleted

## 2018-03-31 VITALS — BP 133/84 | HR 86 | Wt 181.0 lb

## 2018-03-31 DIAGNOSIS — Z331 Pregnant state, incidental: Secondary | ICD-10-CM

## 2018-03-31 DIAGNOSIS — O0990 Supervision of high risk pregnancy, unspecified, unspecified trimester: Secondary | ICD-10-CM

## 2018-03-31 DIAGNOSIS — Z3A38 38 weeks gestation of pregnancy: Secondary | ICD-10-CM | POA: Diagnosis not present

## 2018-03-31 DIAGNOSIS — O0993 Supervision of high risk pregnancy, unspecified, third trimester: Secondary | ICD-10-CM | POA: Diagnosis not present

## 2018-03-31 DIAGNOSIS — O10913 Unspecified pre-existing hypertension complicating pregnancy, third trimester: Secondary | ICD-10-CM | POA: Diagnosis not present

## 2018-03-31 DIAGNOSIS — O10919 Unspecified pre-existing hypertension complicating pregnancy, unspecified trimester: Secondary | ICD-10-CM

## 2018-03-31 DIAGNOSIS — Z1389 Encounter for screening for other disorder: Secondary | ICD-10-CM

## 2018-03-31 LAB — POCT URINALYSIS DIPSTICK OB
Blood, UA: NEGATIVE
Glucose, UA: NEGATIVE
Ketones, UA: NEGATIVE
Leukocytes, UA: NEGATIVE
Nitrite, UA: NEGATIVE

## 2018-03-31 NOTE — Telephone Encounter (Signed)
Preadmission screen  

## 2018-03-31 NOTE — Progress Notes (Signed)
   HIGH-RISK PREGNANCY VISIT Patient name: Natasha Bean MRN [id number]  Date of birth: 01-04-1986 Chief Complaint:   High Risk Gestation (NST)  History of Present Illness:   Natasha Bean is a 33 y.o. G51P1001 female at [redacted]w[redacted]d with an Estimated Date of Delivery: 04/14/18 being seen today for ongoing management of a high-risk pregnancy complicated by chronic HTN, hx of pre eclampsia + PP HELLP.  Today she reports no complaints. Contractions: Irritability. Vag. Bleeding: None.  Movement: Present. denies leaking of fluid.  Review of Systems:   Pertinent items are noted in HPI Denies abnormal vaginal discharge w/ itching/odor/irritation, headaches, visual changes, shortness of breath, chest pain, abdominal pain, severe nausea/vomiting, or problems with urination or bowel movements unless otherwise stated above. Pertinent History Reviewed:  Reviewed past medical,surgical, social, obstetrical and family history.  Reviewed problem list, medications and allergies. Physical Assessment:   Vitals:   03/31/18 1000  BP: 133/84  Pulse: 86  Weight: 181 lb (82.1 kg)  Body mass index is 33.11 kg/m.           Physical Examination:   General appearance: alert, well appearing, and in no distress  Mental status: alert, oriented to person, place, and time  Skin: warm & dry   Extremities: Edema: Trace    Cardiovascular: normal heart rate noted  Respiratory: normal respiratory effort, no distress  Abdomen: gravid, soft, non-tender  Pelvic: Cervical exam performed       FT/th/-3/soft/post  Fetal Status: Fetal Heart Rate (bpm): 135 Fundal Height: 38 cm Movement: Present    Fetal Surveillance Testing today: reactive NST   Results for orders placed or performed in visit on 03/31/18 (from the past 24 hour(s))  POC Urinalysis Dipstick OB   Collection Time: 03/31/18 10:03 AM  Result Value Ref Range   Color, UA     Clarity, UA     Glucose, UA Negative Negative   Bilirubin, UA     Ketones, UA neg    Spec Grav, UA     Blood, UA neg    pH, UA     POC,PROTEIN,UA Small (1+) Negative, Trace, Small (1+), Moderate (2+), Large (3+), 4+   Urobilinogen, UA     Nitrite, UA neg    Leukocytes, UA Negative Negative   Appearance     Odor      Assessment & Plan:  1) High-risk pregnancy G2P1001 at [redacted]w[redacted]d with an Estimated Date of Delivery: 04/14/18   2) CHTN, stable, labetalol 100 MG BID(takes 1/2 dose)   3) Hx of pre eclmapsia with PP HELLP,   4) Anemia, scheduled for 2nd feraheme 04/02/2018  Meds: No orders of the defined types were placed in this encounter.   Labs/procedures today: Reactive NST  Treatment Plan:  IOL 39 weeks scheduled for @0700   Reviewed: Term labor symptoms and general obstetric precautions including but not limited to vaginal bleeding, contractions, leaking of fluid and fetal movement were reviewed in detail with the patient.  All questions were answered.  Follow-up: Return in about 3 days (around 04/03/2018) for BPP/sono, HROB.  Orders Placed This Encounter  Procedures  . POC Urinalysis Dipstick OB   Amaryllis Dyke Eure  03/31/2018 11:13 AM

## 2018-03-31 NOTE — Addendum Note (Signed)
Addended by: Lazaro Arms on: 03/31/2018 02:33 PM   Modules accepted: Orders, SmartSet

## 2018-03-31 NOTE — Treatment Plan (Signed)
   Induction Assessment Scheduling Form: [facility]ax to [facility]:  [phone]  Natasha Bean                                                                                   DOB:  03/05/1986                                                            MRN:  [id number]                                                                     Phone #:                            Provider:  [facility]  GP:  [id number]                                                            Estimated Date of Delivery: 04/14/18  Dating Criteria: early sonogram    Medical Indications for induction:  Chronic hypertension Admission Date/Time:  04/07/2018 0700 Gestational age on admission:  7386w0d   Filed Weights   03/31/18 1000  Weight: 181 lb (82.1 kg)   HIV:  Non Reactive (11/08 0855) GBS: Negative (01/09 1130)  2 cm dilated, 0 effaced, -3 station, cervical position post, consistency moderate, Bishop score 8, presenting part vertex   Method of induction(proposed):  cytotec   Scheduling Provider Signature:  Natasha ArmsLuther H Susie Ehresman, MD                                            Today's Date:  03/31/2018

## 2018-04-02 ENCOUNTER — Encounter (HOSPITAL_COMMUNITY): Payer: Self-pay

## 2018-04-02 ENCOUNTER — Encounter (HOSPITAL_COMMUNITY)
Admission: RE | Admit: 2018-04-02 | Discharge: 2018-04-02 | Disposition: A | Discharge status: Home / Self Care | Payer: Medicaid Other | Source: Ambulatory Visit | Attending: Obstetrics & Gynecology | Admitting: Obstetrics & Gynecology

## 2018-04-02 DIAGNOSIS — O99013 Anemia complicating pregnancy, third trimester: Secondary | ICD-10-CM | POA: Diagnosis not present

## 2018-04-02 MED ORDER — SODIUM CHLORIDE 0.9 % IV SOLN
Freq: Once | INTRAVENOUS | Status: AC
  Administered 2018-04-02: 12:00:00 via INTRAVENOUS

## 2018-04-02 MED ORDER — SODIUM CHLORIDE 0.9 % IV SOLN
510.0000 mg | Freq: Once | INTRAVENOUS | Status: AC
  Administered 2018-04-02: 510 mg via INTRAVENOUS
  Filled 2018-04-02: qty 510

## 2018-04-03 ENCOUNTER — Ambulatory Visit (INDEPENDENT_AMBULATORY_CARE_PROVIDER_SITE_OTHER): Payer: Medicaid Other | Admitting: Advanced Practice Midwife

## 2018-04-03 ENCOUNTER — Ambulatory Visit (INDEPENDENT_AMBULATORY_CARE_PROVIDER_SITE_OTHER): Payer: Medicaid Other

## 2018-04-03 ENCOUNTER — Encounter: Payer: Self-pay | Admitting: Advanced Practice Midwife

## 2018-04-03 VITALS — BP 130/76 | HR 92 | Wt 183.0 lb

## 2018-04-03 DIAGNOSIS — O10919 Unspecified pre-existing hypertension complicating pregnancy, unspecified trimester: Secondary | ICD-10-CM | POA: Diagnosis not present

## 2018-04-03 DIAGNOSIS — Z1389 Encounter for screening for other disorder: Secondary | ICD-10-CM

## 2018-04-03 DIAGNOSIS — O0990 Supervision of high risk pregnancy, unspecified, unspecified trimester: Secondary | ICD-10-CM | POA: Diagnosis not present

## 2018-04-03 DIAGNOSIS — Z331 Pregnant state, incidental: Secondary | ICD-10-CM

## 2018-04-03 DIAGNOSIS — Z3A38 38 weeks gestation of pregnancy: Secondary | ICD-10-CM | POA: Diagnosis not present

## 2018-04-03 LAB — POCT URINALYSIS DIPSTICK OB
Blood, UA: NEGATIVE
Glucose, UA: NEGATIVE
Ketones, UA: NEGATIVE
Leukocytes, UA: NEGATIVE
Nitrite, UA: NEGATIVE
POC,PROTEIN,UA: NEGATIVE

## 2018-04-03 NOTE — Progress Notes (Signed)
[place] 38+3 wks,cephalic,BPP 8/8,fhr 135 bpm,anterior placenta gr 3,afi 15 cm,normal ovaries bilat,RI .58,.50,.59=56%

## 2018-04-03 NOTE — Progress Notes (Signed)
HIGH-RISK PREGNANCY VISIT Patient name: Natasha Bean MRN [id number]  Date of birth: 03/11/1986 Chief Complaint:   High Risk Gestation (u/s today)  History of Present Illness:   Edilia BoChristy A Grantz is a 33 y.o. 422P1001 female at 8576w3d with an Estimated Date of Delivery: 04/14/18 being seen today for ongoing management of a high-risk pregnancy complicated by chronic HTN, hx of pre eclampsia + PP HELLP.  Today she reports BP dropped 70.60 the other day, so stopped meds.. Contractions: Not present. Vag. Bleeding: None.  Movement: Present. denies leaking of fluid.  Review of Systems:   Pertinent items are noted in HPI Denies abnormal vaginal discharge w/ itching/odor/irritation, headaches, visual changes, shortness of breath, chest pain, abdominal pain, severe nausea/vomiting, or problems with urination or bowel movements unless otherwise stated above.    Pertinent History Reviewed:  Medical & Surgical Hx:   Past Medical History:  Diagnosis Date  . Anemia   . Asthma   . Contraceptive management 06/10/2015  . Hematuria 01/06/2015  . History of marijuana use 11/01/2014   Repeat UDS- Neg (04/19/2015)  . Migraine   . Pregnancy induced hypertension   . Round ligament pain 01/06/2015  . Trichimoniasis 01/06/2015   Repeat neg- 01/17/15  . Urinary frequency 01/06/2015  . Vaginal discharge during pregnancy in second trimester 01/06/2015   Past Surgical History:  Procedure Laterality Date  . NO PAST SURGERIES     Family History  Problem Relation Age of Onset  . Cancer Mother        throat  . Hypertension Father   . Diabetes Father   . Cancer Father        colon  . Heart disease Father   . Thyroid disease Father   . Stroke Father   . Kidney failure Father   . Heart disease Paternal Grandmother   . Hypertension Paternal Grandmother   . Diabetes Paternal Grandmother   . Seizures Daughter     Current Outpatient Medications:  .  aspirin EC 81 MG tablet, Take 2 tablets (162 mg total) by  mouth daily., Disp: 60 tablet, Rfl: 6 .  Doxylamine-Pyridoxine (DICLEGIS) 10-10 MG TBEC, 2 tabs q hs, if sx persist add 1 tab q am on day 3, if sx persist add 1 tab q afternoon on day 4, Disp: 100 tablet, Rfl: 6 .  Ferrous Sulfate Dried (FERROUS SULFATE IRON) 200 (65 Fe) MG TABS, Take 1 tablet by mouth 2 (two) times daily., Disp: 60 tablet, Rfl: 11 .  omeprazole (PRILOSEC) 20 MG capsule, Take 1 capsule (20 mg total) by mouth daily. 1 tablet a day, Disp: 30 capsule, Rfl: 6 .  Prenatal Vit-Fe Fumarate-FA (PRENATAL COMPLETE) 14-0.4 MG TABS, Take 1 tablet by mouth daily., Disp: 60 each, Rfl: 1 .  sertraline (ZOLOFT) 25 MG tablet, Take 1 tablet (25 mg total) by mouth daily., Disp: 30 tablet, Rfl: 6 .  silver sulfADIAZINE (SILVADENE) 1 % cream, Use to area 3-4 times daily, Disp: 50 g, Rfl: 11 .  acetaminophen (TYLENOL) 500 MG tablet, Take 1,000 mg by mouth 2 (two) times daily as needed., Disp: , Rfl:  .  albuterol (PROVENTIL HFA;VENTOLIN HFA) 108 (90 Base) MCG/ACT inhaler, Inhale into the lungs every 6 (six) hours as needed for wheezing or shortness of breath., Disp: , Rfl:  .  labetalol (NORMODYNE) 200 MG tablet, Take 1 tablet (200 mg total) by mouth 2 (two) times daily. (Patient not taking: Reported on 04/03/2018), Disp: 60 tablet, Rfl: 3 Social History: Reviewed -  reports that she has been smoking cigarettes. She has a 3.50 pack-year smoking history. She has never used smokeless tobacco.   Physical Assessment:   Vitals:   04/03/18 1414  BP: 130/76  Pulse: 92  Weight: 183 lb (83 kg)  Body mass index is 33.47 kg/m.           Physical Examination:   General appearance: alert, well appearing, and in no distress  Mental status: alert, oriented to person, place, and time  Skin: warm & dry   Extremities: Edema: Trace    Cardiovascular: normal heart rate noted  Respiratory: normal respiratory effort, no distress  Abdomen: gravid, soft, non-tender  Pelvic: Cervical exam deferred         Fetal  Status:     Movement: Present    Fetal Surveillance Testing today: US 38+3 wks,cephalic,BPP 8/8,fhr 135 bpm,anterior placenta gr 3,afi 15 cm,normal ovaries bilat,RI .58,.50,.59=56%  Results for orders placed or performed in visit on 04/03/18 (from the past 24 hour(s))  POC Urinalysis Dipstick OB   Collection Time: 04/03/18  2:15 PM  Result Value Ref Range   Color, UA     Clarity, UA     Glucose, UA Negative Negative   Bilirubin, UA     Ketones, UA neg    Spec Grav, UA     Blood, UA neg    pH, UA     POC,PROTEIN,UA Negative Negative, Trace, Small (1+), Moderate (2+), Large (3+), 4+   Urobilinogen, UA     Nitrite, UA neg    Leukocytes, UA Negative Negative   Appearance     Odor      Assessment & Plan:  1) High-risk pregnancy G2P1001 at 9024w3d with an Estimated Date of Delivery: 04/14/18   2) CHTN, stable.  Treatment Plan stop 100mg  labetalol BID, stop ASA. IOL scheduled by Dr. Despina HiddenEure for 1/27    Labs/procedures today: BPP/dopplers   Follow-up: Return in about 13 days (around 04/16/2018) for RN BP check.  Future Appointments  Date Time Provider Department Center  04/07/2018  7:00 AM [facility] RO[facility] None    Orders Placed This Encounter  Procedures  . POC Urinalysis Dipstick OB   Jacklyn ShellFrances Cresenzo-Dishmon CNM 04/03/2018 2:25 PM '

## 2018-04-07 ENCOUNTER — Other Ambulatory Visit: Payer: Self-pay

## 2018-04-07 ENCOUNTER — Inpatient Hospital Stay (HOSPITAL_COMMUNITY)
Admission: RE | Admit: 2018-04-07 | Discharge: 2018-04-09 | DRG: 798 | Disposition: A | Discharge status: Home / Self Care | Payer: Medicaid Other | Attending: Obstetrics & Gynecology | Admitting: Obstetrics & Gynecology

## 2018-04-07 ENCOUNTER — Inpatient Hospital Stay (HOSPITAL_COMMUNITY): Payer: Medicaid Other | Admitting: Anesthesiology

## 2018-04-07 ENCOUNTER — Inpatient Hospital Stay (HOSPITAL_COMMUNITY): Payer: Medicaid Other

## 2018-04-07 ENCOUNTER — Encounter (HOSPITAL_COMMUNITY): Payer: Self-pay

## 2018-04-07 DIAGNOSIS — O9952 Diseases of the respiratory system complicating childbirth: Secondary | ICD-10-CM | POA: Diagnosis present

## 2018-04-07 DIAGNOSIS — O99214 Obesity complicating childbirth: Secondary | ICD-10-CM | POA: Diagnosis present

## 2018-04-07 DIAGNOSIS — J45909 Unspecified asthma, uncomplicated: Secondary | ICD-10-CM | POA: Diagnosis present

## 2018-04-07 DIAGNOSIS — F17210 Nicotine dependence, cigarettes, uncomplicated: Secondary | ICD-10-CM | POA: Diagnosis present

## 2018-04-07 DIAGNOSIS — O1002 Pre-existing essential hypertension complicating childbirth: Principal | ICD-10-CM | POA: Diagnosis present

## 2018-04-07 DIAGNOSIS — O9902 Anemia complicating childbirth: Secondary | ICD-10-CM | POA: Diagnosis present

## 2018-04-07 DIAGNOSIS — O99334 Smoking (tobacco) complicating childbirth: Secondary | ICD-10-CM | POA: Diagnosis present

## 2018-04-07 DIAGNOSIS — O0990 Supervision of high risk pregnancy, unspecified, unspecified trimester: Secondary | ICD-10-CM

## 2018-04-07 DIAGNOSIS — Z3A39 39 weeks gestation of pregnancy: Secondary | ICD-10-CM

## 2018-04-07 DIAGNOSIS — Z302 Encounter for sterilization: Secondary | ICD-10-CM

## 2018-04-07 DIAGNOSIS — O99019 Anemia complicating pregnancy, unspecified trimester: Secondary | ICD-10-CM | POA: Diagnosis present

## 2018-04-07 DIAGNOSIS — O10919 Unspecified pre-existing hypertension complicating pregnancy, unspecified trimester: Secondary | ICD-10-CM | POA: Diagnosis present

## 2018-04-07 DIAGNOSIS — O134 Gestational [pregnancy-induced] hypertension without significant proteinuria, complicating childbirth: Secondary | ICD-10-CM | POA: Diagnosis not present

## 2018-04-07 DIAGNOSIS — F17200 Nicotine dependence, unspecified, uncomplicated: Secondary | ICD-10-CM | POA: Diagnosis present

## 2018-04-07 DIAGNOSIS — O09299 Supervision of pregnancy with other poor reproductive or obstetric history, unspecified trimester: Secondary | ICD-10-CM

## 2018-04-07 LAB — COMPREHENSIVE METABOLIC PANEL
ALT: 15 U/L (ref 0–44)
AST: 24 U/L (ref 15–41)
Albumin: 2.9 g/dL — ABNORMAL LOW (ref 3.5–5.0)
Alkaline Phosphatase: 156 U/L — ABNORMAL HIGH (ref 38–126)
Anion gap: 9 (ref 5–15)
BUN: 15 mg/dL (ref 6–20)
CALCIUM: 8.3 mg/dL — AB (ref 8.9–10.3)
CO2: 19 mmol/L — ABNORMAL LOW (ref 22–32)
Chloride: 106 mmol/L (ref 98–111)
Creatinine, Ser: 1.03 mg/dL — ABNORMAL HIGH (ref 0.44–1.00)
GFR calc Af Amer: >60 mL/min (ref >60)
GFR calc non Af Amer: >60 mL/min (ref >60)
Glucose, Bld: 138 mg/dL — ABNORMAL HIGH (ref 70–99)
Potassium: 3.6 mmol/L (ref 3.5–5.1)
Sodium: 134 mmol/L — ABNORMAL LOW (ref 135–145)
Total Bilirubin: 0.8 mg/dL (ref 0.3–1.2)
Total Protein: 6 g/dL — ABNORMAL LOW (ref 6.5–8.1)

## 2018-04-07 LAB — CBC
HCT: 34.3 % — ABNORMAL LOW (ref 36.0–46.0)
HEMOGLOBIN: 11.1 g/dL — AB (ref 12.0–15.0)
MCH: 30.8 pg (ref 26.0–34.0)
MCHC: 32.4 g/dL (ref 30.0–36.0)
MCV: 95.3 fL (ref 80.0–100.0)
Platelets: 256 10*3/uL (ref 150–400)
RBC: 3.6 MIL/uL — ABNORMAL LOW (ref 3.87–5.11)
RDW: 16.6 % — ABNORMAL HIGH (ref 11.5–15.5)
WBC: 8.3 10*3/uL (ref 4.0–10.5)
nRBC: 0 % (ref 0.0–0.2)

## 2018-04-07 LAB — PROTEIN / CREATININE RATIO, URINE
Creatinine, Urine: 58 mg/dL
Protein Creatinine Ratio: 0.14 mg/mg{Cre} (ref 0.00–0.15)
TOTAL PROTEIN, URINE: 8 mg/dL

## 2018-04-07 LAB — RPR: RPR: NONREACTIVE

## 2018-04-07 LAB — TYPE AND SCREEN
ABO/RH(D): A POS
Antibody Screen: NEGATIVE

## 2018-04-07 MED ORDER — WITCH HAZEL-GLYCERIN EX PADS: 1.0000 "application " | MEDICATED_PAD | CUTANEOUS | Status: DC | PRN

## 2018-04-07 MED ORDER — PHENYLEPHRINE 40 MCG/ML (10ML) SYRINGE FOR IV PUSH (FOR BLOOD PRESSURE SUPPORT): 80.0000 ug | PREFILLED_SYRINGE | INTRAVENOUS | Status: DC | PRN

## 2018-04-07 MED ORDER — EPHEDRINE 5 MG/ML INJ: 10.0000 mg | INTRAVENOUS | Status: DC | PRN

## 2018-04-07 MED ORDER — FENTANYL 2.5 MCG/ML BUPIVACAINE 1/10 % EPIDURAL INFUSION (WH - ANES)
14.0000 mL/h | INTRAMUSCULAR | Status: DC | PRN
  Administered 2018-04-07: 14 mL/h via EPIDURAL
  Filled 2018-04-07: qty 100

## 2018-04-07 MED ORDER — LACTATED RINGERS IV SOLN
500.0000 mL | INTRAVENOUS | Status: DC | PRN
  Administered 2018-04-07: 500 mL via INTRAVENOUS

## 2018-04-07 MED ORDER — FENTANYL CITRATE (PF) 100 MCG/2ML IJ SOLN: 100.0000 ug | INTRAMUSCULAR | Status: DC | PRN

## 2018-04-07 MED ORDER — BUPIVACAINE HCL (PF) 0.5 % IJ SOLN
INTRAMUSCULAR | Status: AC
  Filled 2018-04-07: qty 30

## 2018-04-07 MED ORDER — OXYTOCIN 40 UNITS IN NORMAL SALINE INFUSION - SIMPLE MED
2.5000 [IU]/h | INTRAVENOUS | Status: DC
  Administered 2018-04-07: 2.5 [IU]/h via INTRAVENOUS

## 2018-04-07 MED ORDER — DIPHENHYDRAMINE HCL 50 MG/ML IJ SOLN: 12.5000 mg | INTRAMUSCULAR | Status: DC | PRN

## 2018-04-07 MED ORDER — ACETAMINOPHEN 325 MG PO TABS
650.0000 mg | ORAL_TABLET | ORAL | Status: DC | PRN
  Administered 2018-04-07 – 2018-04-08 (×3): 650 mg via ORAL
  Filled 2018-04-07 (×3): qty 2

## 2018-04-07 MED ORDER — ACETAMINOPHEN 325 MG PO TABS: 650.0000 mg | ORAL_TABLET | ORAL | Status: DC | PRN

## 2018-04-07 MED ORDER — LACTATED RINGERS IV SOLN
INTRAVENOUS | Status: DC
  Administered 2018-04-07 (×2): via INTRAVENOUS

## 2018-04-07 MED ORDER — MISOPROSTOL 25 MCG QUARTER TABLET
25.0000 ug | ORAL_TABLET | ORAL | Status: DC | PRN
  Administered 2018-04-07: 25 ug via VAGINAL
  Filled 2018-04-07: qty 1

## 2018-04-07 MED ORDER — LIDOCAINE HCL (PF) 1 % IJ SOLN
INTRAMUSCULAR | Status: DC | PRN
  Administered 2018-04-07: 13 mL via EPIDURAL

## 2018-04-07 MED ORDER — TETANUS-DIPHTH-ACELL PERTUSSIS 5-2.5-18.5 LF-MCG/0.5 IM SUSP: 0.5000 mL | Freq: Once | INTRAMUSCULAR | Status: DC

## 2018-04-07 MED ORDER — SOD CITRATE-CITRIC ACID 500-334 MG/5ML PO SOLN
30.0000 mL | ORAL | Status: DC | PRN
  Administered 2018-04-07: 30 mL via ORAL
  Filled 2018-04-07: qty 15

## 2018-04-07 MED ORDER — OXYTOCIN BOLUS FROM INFUSION: 500.0000 mL | Freq: Once | INTRAVENOUS | Status: DC

## 2018-04-07 MED ORDER — PRENATAL MULTIVITAMIN CH
1.0000 | ORAL_TABLET | Freq: Every day | ORAL | Status: DC
  Administered 2018-04-09: 1 via ORAL
  Filled 2018-04-07: qty 1

## 2018-04-07 MED ORDER — OXYTOCIN 40 UNITS IN NORMAL SALINE INFUSION - SIMPLE MED: 1.0000 m[IU]/min | INTRAVENOUS | Status: DC

## 2018-04-07 MED ORDER — IBUPROFEN 600 MG PO TABS
600.0000 mg | ORAL_TABLET | Freq: Four times a day (QID) | ORAL | Status: DC
  Administered 2018-04-07 – 2018-04-09 (×8): 600 mg via ORAL
  Filled 2018-04-07 (×8): qty 1

## 2018-04-07 MED ORDER — DIBUCAINE 1 % RE OINT: 1.0000 "application " | TOPICAL_OINTMENT | RECTAL | Status: DC | PRN

## 2018-04-07 MED ORDER — SIMETHICONE 80 MG PO CHEW: 80.0000 mg | CHEWABLE_TABLET | ORAL | Status: DC | PRN

## 2018-04-07 MED ORDER — ONDANSETRON HCL 4 MG/2ML IJ SOLN
INTRAMUSCULAR | Status: AC
  Filled 2018-04-07: qty 2

## 2018-04-07 MED ORDER — AMLODIPINE BESYLATE 5 MG PO TABS
5.0000 mg | ORAL_TABLET | Freq: Every day | ORAL | Status: DC
  Administered 2018-04-08 – 2018-04-09 (×2): 5 mg via ORAL
  Filled 2018-04-07 (×3): qty 1

## 2018-04-07 MED ORDER — OXYCODONE-ACETAMINOPHEN 5-325 MG PO TABS: 1.0000 | ORAL_TABLET | ORAL | Status: DC | PRN

## 2018-04-07 MED ORDER — DEXAMETHASONE SODIUM PHOSPHATE 4 MG/ML IJ SOLN
INTRAMUSCULAR | Status: AC
  Filled 2018-04-07: qty 1

## 2018-04-07 MED ORDER — OXYTOCIN 40 UNITS IN NORMAL SALINE INFUSION - SIMPLE MED
1.0000 m[IU]/min | INTRAVENOUS | Status: DC
  Filled 2018-04-07: qty 1000

## 2018-04-07 MED ORDER — LIDOCAINE HCL (PF) 1 % IJ SOLN: 30.0000 mL | INTRAMUSCULAR | Status: DC | PRN

## 2018-04-07 MED ORDER — ONDANSETRON HCL 4 MG/2ML IJ SOLN: 4.0000 mg | INTRAMUSCULAR | Status: DC | PRN

## 2018-04-07 MED ORDER — TERBUTALINE SULFATE 1 MG/ML IJ SOLN: 0.2500 mg | Freq: Once | INTRAMUSCULAR | Status: DC | PRN

## 2018-04-07 MED ORDER — ZOLPIDEM TARTRATE 5 MG PO TABS: 5.0000 mg | ORAL_TABLET | Freq: Every evening | ORAL | Status: DC | PRN

## 2018-04-07 MED ORDER — ONDANSETRON HCL 4 MG/2ML IJ SOLN: 4.0000 mg | Freq: Four times a day (QID) | INTRAMUSCULAR | Status: DC | PRN

## 2018-04-07 MED ORDER — OXYCODONE-ACETAMINOPHEN 5-325 MG PO TABS: 2.0000 | ORAL_TABLET | ORAL | Status: DC | PRN

## 2018-04-07 MED ORDER — PHENYLEPHRINE 40 MCG/ML (10ML) SYRINGE FOR IV PUSH (FOR BLOOD PRESSURE SUPPORT)
80.0000 ug | PREFILLED_SYRINGE | INTRAVENOUS | Status: DC | PRN
  Filled 2018-04-07: qty 10

## 2018-04-07 MED ORDER — ONDANSETRON HCL 4 MG PO TABS: 4.0000 mg | ORAL_TABLET | ORAL | Status: DC | PRN

## 2018-04-07 MED ORDER — SENNOSIDES-DOCUSATE SODIUM 8.6-50 MG PO TABS
2.0000 | ORAL_TABLET | ORAL | Status: DC
  Administered 2018-04-07 – 2018-04-08 (×2): 2 via ORAL
  Filled 2018-04-07 (×2): qty 2

## 2018-04-07 MED ORDER — COCONUT OIL OIL: 1.0000 "application " | TOPICAL_OIL | Status: DC | PRN

## 2018-04-07 MED ORDER — LACTATED RINGERS IV SOLN
500.0000 mL | Freq: Once | INTRAVENOUS | Status: AC
  Administered 2018-04-07: 500 mL via INTRAVENOUS

## 2018-04-07 MED ORDER — LIDOCAINE-EPINEPHRINE (PF) 2 %-1:200000 IJ SOLN
INTRAMUSCULAR | Status: AC
  Filled 2018-04-07: qty 20

## 2018-04-07 MED ORDER — BENZOCAINE-MENTHOL 20-0.5 % EX AERO: 1.0000 "application " | INHALATION_SPRAY | CUTANEOUS | Status: DC | PRN

## 2018-04-07 NOTE — Discharge Summary (Addendum)
Postpartum Discharge Summary     Patient Name: Natasha Bean DOB: [birth year] MRN: [id number]  Date of admission: 04/07/2018 Delivering Provider: Dollene Cleveland   Date of discharge: 04/09/2018  Admitting diagnosis: 39 WKS INDUCTION Intrauterine pregnancy: [redacted]w[redacted]d     Secondary diagnosis:  Active Problems:   Anemia affecting second pregnancy   Chronic hypertension affecting pregnancy   H/O preeclampsia then postpartum HELLP  Additional problems: None     Discharge diagnosis: Term Pregnancy Delivered, CHTN and Anemia                                                                                                Post partum procedures:postpartum tubal ligation  Augmentation: Cytotec and Foley Balloon  Complications: None  Hospital course:  Induction of Labor With Vaginal Delivery   33 y.o. yo G2P1001 at [redacted]w[redacted]d was admitted to the hospital 04/07/2018 for induction of labor.  Indication for induction: Chronic hypertension.  Patient had an uncomplicated labor course as follows: Membrane Rupture Time/Date: 2:19 PM ,04/07/2018   Intrapartum Procedures: Episiotomy: None [1]                                         Lacerations:  None [1]  Patient had delivery of a Viable infant.  Information for the patient's newborn:  Maymie, Vornd[place] Girl Wandalee [[id number]]  Delivery Method: Vaginal, Spontaneous(Filed from Delivery Summary)   04/07/2018  Details of delivery can be found in separate delivery note.  Patient had a routine postpartum course. Patient is discharged home 04/09/18.  Magnesium Sulfate recieved: No BMZ received: No  Physical exam  Vitals:   04/08/18 2045 04/09/18 0037 04/09/18 0525 04/09/18 0740  BP: 133/80 125/86 136/85 128/78  Pulse: 60 61 67 69  Resp: 18 18 18 18   Temp: 97.8 F (36.6 C) 98.1 F (36.7 C) 98.5 F (36.9 C) 98.1 F (36.7 C)  TempSrc: Oral Oral Oral Oral  SpO2: 98% 98% 98%   Weight:      Height:       General: alert, cooperative and no  distress Lochia: appropriate Uterine Fundus: firm Incision: Healing well with no significant drainage, No significant erythema, Dressing is clean, dry, and intact DVT Evaluation: No evidence of DVT seen on physical exam. Labs: Lab Results  Component Value Date   WBC 10.1 04/08/2018   HGB 9.2 (L) 04/08/2018   HCT 28.0 (L) 04/08/2018   MCV 94.0 04/08/2018   PLT 172 04/08/2018   CMP Latest Ref Rng & Units 04/07/2018  Glucose 70 - 99 mg/dL 038(U)  BUN 6 - 20 mg/dL 15  Creatinine 8.28 - 0.03 mg/dL 4.91(P)  Sodium 915 - 056 mmol/L 134(L)  Potassium 3.5 - 5.1 mmol/L 3.6  Chloride 98 - 111 mmol/L 106  CO2 22 - 32 mmol/L 19(L)  Calcium 8.9 - 10.3 mg/dL 8.3(L)  Total Protein 6.5 - 8.1 g/dL 6.0(L)  Total Bilirubin 0.3 - 1.2 mg/dL 0.8  Alkaline Phos 38 - 126 U/L 156(H)  AST 15 - 41  U/L 24  ALT 0 - 44 U/L 15    Discharge instruction: per After Visit Summary and "Baby and Me Booklet".  After visit meds:  Allergies as of 04/09/2018      Reactions   Benadryl [diphenhydramine Hcl] Hives   Makes her shakey and hives   Fioricet [butalbital-apap-caffeine] Hives, Itching, Nausea And Vomiting   Able to take tylenol      Medication List    STOP taking these medications   aspirin EC 81 MG tablet   Doxylamine-Pyridoxine 10-10 MG Tbec Commonly known as:  DICLEGIS   Ferrous Sulfate Dried 200 (65 Fe) MG Tabs Commonly known as:  FERROUS SULFATE IRON   labetalol 200 MG tablet Commonly known as:  NORMODYNE   omeprazole 20 MG capsule Commonly known as:  PRILOSEC   PRENATAL COMPLETE 14-0.4 MG Tabs   silver sulfADIAZINE 1 % cream Commonly known as:  SILVADENE     TAKE these medications   albuterol 108 (90 Base) MCG/ACT inhaler Commonly known as:  PROVENTIL HFA;VENTOLIN HFA Inhale into the lungs every 6 (six) hours as needed for wheezing or shortness of breath.   amLODipine 5 MG tablet Commonly known as:  NORVASC Take 1 tablet (5 mg total) by mouth daily for 30 days.   ibuprofen  600 MG tablet Commonly known as:  ADVIL,MOTRIN Take 1 tablet (600 mg total) by mouth every 6 (six) hours.   oxyCODONE-acetaminophen 5-325 MG tablet Commonly known as:  PERCOCET/ROXICET Take 1 tablet by mouth every 4 (four) hours as needed for moderate pain.   prenatal multivitamin Tabs tablet Take 1 tablet by mouth daily at 12 noon for 30 days.   sertraline 25 MG tablet Commonly known as:  ZOLOFT Take 1 tablet (25 mg total) by mouth daily.       Diet: routine diet  Activity: Advance as tolerated. Pelvic rest for 6 weeks.   Outpatient follow up:6 weeks Follow up Appt: Future Appointments  Date Time Provider Department Center  04/16/2018 11:00 AM [facility] NURSE TECH [facility]   Follow up Visit: Please schedule this patient for Postpartum visit in: 3 day BP check and 6 weeks with the following provider: Any provider For C/S patients schedule nurse incision check in weeks 2 weeks: no High risk pregnancy complicated by: cHTN, anemia Delivery mode:  SVD Anticipated Birth Control:  BTL done PP PP Procedures needed: None  Schedule Integrated BH visit: no   Newborn Data: Live born female  Birth Weight:   APGAR: 9, 9  Newborn Delivery   Birth date/time:  04/07/2018 14:52:00 Delivery type:  Vaginal, Spontaneous     Baby Feeding: Bottle Disposition:home with mother   04/09/2018 Cristine Polio, MD    I have seen and examined this patient and agree with above documentation in the resident's note.   Discussed postpartum appointment with target of week 4-5 postpartum.   Calvert Cantor, CNM 04/11/18  4:08 PM  04/11/2018

## 2018-04-07 NOTE — H&P (Addendum)
LABOR AND DELIVERY ADMISSION HISTORY AND PHYSICAL NOTE  Natasha Bean is a 33 y.o. female G2P1001 with IUP at [redacted]w[redacted]d by 1st-trimester-US presenting for IOL cHTN. .  She reports positive fetal movement. She denies leakage of fluid or vaginal bleeding.  Prenatal History/Complications: PNC at [facility] Pregnancy complications:  - cHTN  Past Medical History: Past Medical History:  Diagnosis Date  . Anemia   . Asthma   . Contraceptive management 06/10/2015  . Hematuria 01/06/2015  . History of marijuana use 11/01/2014   Repeat UDS- Neg (04/19/2015)  . Migraine   . Pregnancy induced hypertension   . Round ligament pain 01/06/2015  . Trichimoniasis 01/06/2015   Repeat neg- 01/17/15  . Urinary frequency 01/06/2015  . Vaginal discharge during pregnancy in second trimester 01/06/2015    Past Surgical History: Past Surgical History:  Procedure Laterality Date  . NO PAST SURGERIES      Obstetrical History: OB History    Gravida  2   Para  1   Term  1   Preterm      AB      Living  1     SAB      TAB      Ectopic      Multiple  0   Live Births  1           Social History: Social History   Socioeconomic History  . Marital status: Single    Spouse name: Not on file  . Number of children: Not on file  . Years of education: Not on file  . Highest education level: Not on file  Occupational History  . Not on file  Social Needs  . Financial resource strain: Not on file  . Food insecurity:    Worry: Not on file    Inability: Not on file  . Transportation needs:    Medical: Not on file    Non-medical: Not on file  Tobacco Use  . Smoking status: Current Every Day Smoker    Packs/day: 0.25    Years: 14.00    Pack years: 3.50    Types: Cigarettes  . Smokeless tobacco: Never Used  . Tobacco comment: 2-3 per day  Substance and Sexual Activity  . Alcohol use: Not Currently    Comment: wine sometimes  . Drug use: Not Currently    Types: Marijuana    Comment:  last used 1 month ago  . Sexual activity: Yes    Birth control/protection: None  Lifestyle  . Physical activity:    Days per week: 6 days    Minutes per session: 30 min  . Stress: Only a little  Relationships  . Social connections:    Talks on phone: Not on file    Gets together: Not on file    Attends religious service: Not on file    Active member of club or organization: Not on file    Attends meetings of clubs or organizations: Not on file    Relationship status: Not on file  Other Topics Concern  . Not on file  Social History Narrative  . Not on file    Family History: Family History  Problem Relation Age of Onset  . Cancer Mother        throat  . Hypertension Father   . Diabetes Father   . Cancer Father        colon  . Heart disease Father   . Thyroid disease Father   .  Stroke Father   . Kidney failure Father   . Heart disease Paternal Grandmother   . Hypertension Paternal Grandmother   . Diabetes Paternal Grandmother   . Seizures Daughter     Allergies: Allergies  Allergen Reactions  . Benadryl [Diphenhydramine Hcl] Hives    Makes her shakey and hives  . Fioricet [Butalbital-Apap-Caffeine] Hives, Itching and Nausea And Vomiting    Able to take tylenol     Medications Prior to Admission  Medication Sig Dispense Refill Last Dose  . Ferrous Sulfate Dried (FERROUS SULFATE IRON) 200 (65 Fe) MG TABS Take 1 tablet by mouth 2 (two) times daily. 60 tablet 11 04/06/2018 at Unknown time  . Prenatal Vit-Fe Fumarate-FA (PRENATAL COMPLETE) 14-0.4 MG TABS Take 1 tablet by mouth daily. 60 each 1 04/06/2018 at Unknown time  . albuterol (PROVENTIL HFA;VENTOLIN HFA) 108 (90 Base) MCG/ACT inhaler Inhale into the lungs every 6 (six) hours as needed for wheezing or shortness of breath.   Taking  . aspirin EC 81 MG tablet Take 2 tablets (162 mg total) by mouth daily. (Patient not taking: Reported on 04/07/2018) 60 tablet 6 Not Taking at Unknown time  . Doxylamine-Pyridoxine  (DICLEGIS) 10-10 MG TBEC 2 tabs q hs, if sx persist add 1 tab q am on day 3, if sx persist add 1 tab q afternoon on day 4 (Patient not taking: Reported on 04/07/2018) 100 tablet 6 Not Taking at Unknown time  . labetalol (NORMODYNE) 200 MG tablet Take 1 tablet (200 mg total) by mouth 2 (two) times daily. (Patient not taking: Reported on 04/03/2018) 60 tablet 3 Not Taking  . omeprazole (PRILOSEC) 20 MG capsule Take 1 capsule (20 mg total) by mouth daily. 1 tablet a day (Patient not taking: Reported on 04/07/2018) 30 capsule 6 Not Taking at Unknown time  . sertraline (ZOLOFT) 25 MG tablet Take 1 tablet (25 mg total) by mouth daily. (Patient not taking: Reported on 04/07/2018) 30 tablet 6 Not Taking at Unknown time  . silver sulfADIAZINE (SILVADENE) 1 % cream Use to area 3-4 times daily (Patient not taking: Reported on 04/07/2018) 50 g 11 Not Taking at Unknown time     Review of Systems  All systems reviewed and negative except as stated in HPI  Physical Exam Blood pressure 138/85, pulse 64, temperature 98.1 F (36.7 C), temperature source Oral, resp. rate 18, height 5\' 2"  (1.575 m), weight 83 kg, last menstrual period 08/04/2017. General appearance: alert, oriented, NAD Lungs: normal respiratory effort Heart: regular rate Abdomen: soft, non-tender; gravid, FH appropriate for GA Extremities: No calf swelling or tenderness Presentation: cephalic Fetal monitoring: baseline 130, moderate variability, accels 15x15, late/variable decels Dilation: 1.5 Effacement (%): Thick Exam by:: Birdena Crandallainey RN   Prenatal labs: ABO, Rh: --/--/A POS (01/27 0743) Antibody: NEG (01/27 0743) Rubella: 1.91 (07/31 1009) RPR: Non Reactive (11/08 0855)  HBsAg: Negative (07/31 1009)  HIV: Non Reactive (11/08 0855)  GC/Chlamydia: negative/negative GBS: Negative (01/09 1130)  2-hr GTT: 76/95/66 Genetic screening:  Declined (not on record) Anatomy US: WNL, girl  Prenatal Transfer Tool  Maternal Diabetes: No Genetic  Screening: Normal Maternal Ultrasounds/Referrals: Normal Fetal Ultrasounds or other Referrals:  None Maternal Substance Abuse:  No Significant Maternal Medications:  None Significant Maternal Lab Results: None  Results for orders placed or performed during the hospital encounter of 04/07/18 (from the past 24 hour(s))  CBC   Collection Time: 04/07/18  7:43 AM  Result Value Ref Range   WBC 8.3 4.0 - 10.5 K/uL  RBC 3.60 (L) 3.87 - 5.11 MIL/uL   Hemoglobin 11.1 (L) 12.0 - 15.0 g/dL   HCT 40.934.3 (L) 81.136.0 - 91.446.0 %   MCV 95.3 80.0 - 100.0 fL   MCH 30.8 26.0 - 34.0 pg   MCHC 32.4 30.0 - 36.0 g/dL   RDW 78.216.6 (H) 95.611.5 - 21.315.5 %   Platelets 256 150 - 400 K/uL   nRBC 0.0 0.0 - 0.2 %  Type and screen   Collection Time: 04/07/18  7:43 AM  Result Value Ref Range   ABO/RH(D) A POS    Antibody Screen NEG    Sample Expiration      04/10/2018 Performed at [facility], [address] [place], [place] [id number]   Protein / creatinine ratio, urine   Collection Time: 04/07/18  8:20 AM  Result Value Ref Range   Creatinine, Urine 58.00 mg/dL   Total Protein, Urine 8 mg/dL   Protein Creatinine Ratio 0.14 0.00 - 0.15 mg/mg[Cre]    Patient Active Problem List   Diagnosis Date Noted  . H/O preeclampsia then postpartum HELLP 01/17/2018  . Supervision of high risk pregnancy, antepartum 10/09/2017  . Chronic hypertension affecting pregnancy 10/09/2017  . Anemia affecting second pregnancy 02/14/2015    Assessment: Shatori A Hulsey is a 33 y.o. G2P1001 at 7652w0d here for IOL of cHTN.  #Labor: latent; FB and cytotec to start induction  #Pain: Desires Epidural #FWB: Category #ID:  GBS negative. No IAP indicated.  #MOF: Bottle #MOC:PP BTL (consent signed 01/17/2018) #Circ:  None, female    Dollene ClevelandHannah C Anderson 04/07/2018, 10:58 AM   OB FELLOW HISTORY AND PHYSICAL ATTESTATION  I have seen and examined this patient; I agree with above documentation in the resident's note.   Gwenevere AbbotNimeka Jaedon Siler,  MD  OB Fellow  04/07/2018, 3:25 PM

## 2018-04-07 NOTE — Progress Notes (Addendum)
Faculty Practice OB/GYN Attending Note  33 y.o. [phone] s/p SVD at [redacted]w[redacted]d now desires postpartum tubal sterilization/permanent sterilization.  Other reversible forms of contraception were discussed with patient; she declines all other modalities. Risks of procedure discussed with patient including but not limited to: risk of regret, permanence of method, bleeding, infection, injury to surrounding organs and need for additional procedures.  Failure risk of about 1% with increased risk of ectopic gestation if pregnancy occurs was also discussed with patient.  Patient verbalized understanding of these risks and wants to proceed with sterilization.  Written informed consent obtained.  To OR when ready.  Jaynie Collins, MD, FACOG Obstetrician & Gynecologist, [facility] for [facility], [facility]   Addendum Patient had a full meal around after 1130, and will not be able to undergo procedure until close to 2000 as per Dr. Hyacinth Meeker (Anesthesiologist).  No elective cases are usually scheduled after hours; so this case was moved to 0930 tomorrow 1/28. Dr. Adrian Blackwater (Attending on call tomorrow) notified. She will go to Mother-Baby units, regular diet for now, NPO after midnight. Routine postpartum care.   Jaynie Collins, MD, FACOG Obstetrician & Gynecologist, [facility] for [employer], [facility]

## 2018-04-07 NOTE — Anesthesia Procedure Notes (Signed)
Epidural Patient location during procedure: OB Start time: 04/07/2018 1:27 PM End time: 04/07/2018 1:38 PM  Staffing Anesthesiologist: Lowella Curb, MD Performed: anesthesiologist   Preanesthetic Checklist Completed: patient identified, site marked, surgical consent, pre-op evaluation, timeout performed, IV checked, risks and benefits discussed and monitors and equipment checked  Epidural Patient position: sitting Prep: ChloraPrep Patient monitoring: heart rate, cardiac monitor, continuous pulse ox and blood pressure Approach: midline Location: L2-L3 Injection technique: LOR saline  Needle:  Needle type: Tuohy  Needle gauge: 17 G Needle length: 9 cm Needle insertion depth: 6 cm Catheter type: closed end flexible Catheter size: 20 Guage Catheter at skin depth: 10 cm Test dose: negative  Assessment Events: blood not aspirated, injection not painful, no injection resistance, negative IV test and no paresthesia  Additional Notes Reason for block:procedure for pain

## 2018-04-07 NOTE — Anesthesia Preprocedure Evaluation (Signed)
Anesthesia Evaluation  Patient identified by MRN, date of birth, ID band Patient awake and Patient confused    Reviewed: Allergy & Precautions, H&P , NPO status , Patient's Chart, lab work & pertinent test results  Airway Mallampati: II       Dental  (+) Teeth Intact   Pulmonary Current Smoker,    Pulmonary exam normal breath sounds clear to auscultation       Cardiovascular Exercise Tolerance: Good hypertension, Normal cardiovascular exam Rhythm:regular Rate:Normal  PIH   Neuro/Psych    GI/Hepatic   Endo/Other  Morbid obesity  Renal/GU      Musculoskeletal   Abdominal   Peds  Hematology  (+) anemia , plts 172 05/19/15   Anesthesia Other Findings   Reproductive/Obstetrics (+) Pregnancy PIH                             Anesthesia Physical  Anesthesia Plan  ASA: III  Anesthesia Plan: Epidural   Post-op Pain Management:    Induction:   PONV Risk Score and Plan:   Airway Management Planned:   Additional Equipment:   Intra-op Plan:   Post-operative Plan:   Informed Consent: I have reviewed the patients History and Physical, chart, labs and discussed the procedure including the risks, benefits and alternatives for the proposed anesthesia with the patient or authorized representative who has indicated his/her understanding and acceptance.       Plan Discussed with:   Anesthesia Plan Comments:         Anesthesia Quick Evaluation

## 2018-04-07 NOTE — Progress Notes (Signed)
Patient ID: Natasha Bean, female   DOB: 08-Sep-1985, 33 y.o.   MRN: [id number] Appear uncomfortable Monitor not showing UCs but there are regularly spaced small variable decels so I adjusted toco and there are frequent contractions  Foley balloon inserted  Dilation: 1.5 Effacement (%): 70 Station: -2 Presentation: Vertex Exam by:: k. jones RN   Will not start Pitocin right now Will get early epidural as I think she will be progressing more rapidly soon

## 2018-04-07 NOTE — Anesthesia Preprocedure Evaluation (Signed)
Anesthesia Evaluation  Patient identified by MRN, date of birth, ID band Patient awake and Patient confused    Reviewed: Allergy & Precautions, H&P , NPO status , Patient's Chart, lab work & pertinent test results  Airway Mallampati: II       Dental  (+) Teeth Intact   Pulmonary asthma , Current Smoker,    Pulmonary exam normal breath sounds clear to auscultation       Cardiovascular Exercise Tolerance: Good hypertension, Normal cardiovascular exam Rhythm:regular Rate:Normal  PIH   Neuro/Psych    GI/Hepatic   Endo/Other  Morbid obesity  Renal/GU      Musculoskeletal   Abdominal   Peds  Hematology  (+) anemia , plts 172 05/19/15   Anesthesia Other Findings   Reproductive/Obstetrics (+) Pregnancy PIH                             Anesthesia Physical  Anesthesia Plan  ASA: III  Anesthesia Plan: Epidural   Post-op Pain Management:    Induction: Intravenous  PONV Risk Score and Plan: 2 and Treatment may vary due to age or medical condition  Airway Management Planned:   Additional Equipment:   Intra-op Plan:   Post-operative Plan:   Informed Consent: I have reviewed the patients History and Physical, chart, labs and discussed the procedure including the risks, benefits and alternatives for the proposed anesthesia with the patient or authorized representative who has indicated his/her understanding and acceptance.     Dental advisory given  Plan Discussed with: CRNA  Anesthesia Plan Comments:         Anesthesia Quick Evaluation

## 2018-04-08 ENCOUNTER — Encounter (HOSPITAL_COMMUNITY)
Admission: RE | Disposition: A | Discharge status: Home / Self Care | Payer: Self-pay | Source: Home / Self Care | Attending: Obstetrics & Gynecology

## 2018-04-08 ENCOUNTER — Inpatient Hospital Stay (HOSPITAL_COMMUNITY): Payer: Medicaid Other | Admitting: Anesthesiology

## 2018-04-08 ENCOUNTER — Encounter (HOSPITAL_COMMUNITY): Payer: Self-pay

## 2018-04-08 DIAGNOSIS — Z302 Encounter for sterilization: Secondary | ICD-10-CM

## 2018-04-08 HISTORY — PX: TUBAL LIGATION: SHX77

## 2018-04-08 LAB — CBC
HCT: 28 % — ABNORMAL LOW (ref 36.0–46.0)
Hemoglobin: 9.2 g/dL — ABNORMAL LOW (ref 12.0–15.0)
MCH: 30.9 pg (ref 26.0–34.0)
MCHC: 32.9 g/dL (ref 30.0–36.0)
MCV: 94 fL (ref 80.0–100.0)
PLATELETS: 172 10*3/uL (ref 150–400)
RBC: 2.98 MIL/uL — AB (ref 3.87–5.11)
RDW: 16.7 % — ABNORMAL HIGH (ref 11.5–15.5)
WBC: 10.1 10*3/uL (ref 4.0–10.5)
nRBC: 0 % (ref 0.0–0.2)

## 2018-04-08 SURGERY — LIGATION, FALLOPIAN TUBE, POSTPARTUM
Anesthesia: Epidural | Site: Abdomen | Laterality: Bilateral | Wound class: Clean

## 2018-04-08 MED ORDER — MIDAZOLAM HCL 2 MG/2ML IJ SOLN
INTRAMUSCULAR | Status: AC
  Filled 2018-04-08: qty 2

## 2018-04-08 MED ORDER — ONDANSETRON HCL 4 MG/2ML IJ SOLN
INTRAMUSCULAR | Status: AC
  Filled 2018-04-08: qty 2

## 2018-04-08 MED ORDER — LACTATED RINGERS IV SOLN
INTRAVENOUS | Status: DC
  Administered 2018-04-08: 10:00:00 via INTRAVENOUS
  Administered 2018-04-08: 20 mL/h via INTRAVENOUS

## 2018-04-08 MED ORDER — BUPIVACAINE HCL (PF) 0.5 % IJ SOLN
INTRAMUSCULAR | Status: DC | PRN
  Administered 2018-04-08: 30 mL

## 2018-04-08 MED ORDER — FAMOTIDINE 20 MG PO TABS
20.0000 mg | ORAL_TABLET | Freq: Once | ORAL | Status: AC
  Administered 2018-04-08: 20 mg via ORAL
  Filled 2018-04-08: qty 1

## 2018-04-08 MED ORDER — METOCLOPRAMIDE HCL 10 MG PO TABS
10.0000 mg | ORAL_TABLET | Freq: Once | ORAL | Status: AC
  Administered 2018-04-08: 10 mg via ORAL
  Filled 2018-04-08: qty 1

## 2018-04-08 MED ORDER — FENTANYL CITRATE (PF) 100 MCG/2ML IJ SOLN
INTRAMUSCULAR | Status: AC
  Filled 2018-04-08: qty 2

## 2018-04-08 MED ORDER — KETOROLAC TROMETHAMINE 30 MG/ML IJ SOLN
INTRAMUSCULAR | Status: DC | PRN
  Administered 2018-04-08: 30 mg via INTRAVENOUS

## 2018-04-08 MED ORDER — SODIUM CHLORIDE 0.9 % IR SOLN
Status: DC | PRN
  Administered 2018-04-08: 1000 mL

## 2018-04-08 MED ORDER — KETOROLAC TROMETHAMINE 30 MG/ML IJ SOLN
INTRAMUSCULAR | Status: AC
  Filled 2018-04-08: qty 1

## 2018-04-08 MED ORDER — FENTANYL CITRATE (PF) 100 MCG/2ML IJ SOLN
INTRAMUSCULAR | Status: DC | PRN
  Administered 2018-04-08: 100 ug via EPIDURAL

## 2018-04-08 MED ORDER — MIDAZOLAM HCL 2 MG/2ML IJ SOLN
INTRAMUSCULAR | Status: DC | PRN
  Administered 2018-04-08 (×2): 1 mg via INTRAVENOUS

## 2018-04-08 MED ORDER — ONDANSETRON HCL 4 MG/2ML IJ SOLN
INTRAMUSCULAR | Status: DC | PRN
  Administered 2018-04-08: 4 mg via INTRAVENOUS

## 2018-04-08 MED ORDER — FAMOTIDINE 20 MG PO TABS
40.0000 mg | ORAL_TABLET | Freq: Once | ORAL | Status: AC
  Administered 2018-04-08: 40 mg via ORAL
  Filled 2018-04-08: qty 2

## 2018-04-08 MED ORDER — LIDOCAINE-EPINEPHRINE (PF) 2 %-1:200000 IJ SOLN
INTRAMUSCULAR | Status: DC | PRN
  Administered 2018-04-08: 5 mL via INTRADERMAL
  Administered 2018-04-08: 2 mL via INTRADERMAL
  Administered 2018-04-08 (×2): 3 mL via INTRADERMAL
  Administered 2018-04-08: 5 mL via INTRADERMAL
  Administered 2018-04-08: 2 mL via INTRADERMAL

## 2018-04-08 MED ORDER — BUPIVACAINE HCL (PF) 0.5 % IJ SOLN
INTRAMUSCULAR | Status: AC
  Filled 2018-04-08: qty 30

## 2018-04-08 MED ORDER — CALCIUM CARBONATE ANTACID 500 MG PO CHEW
1.0000 | CHEWABLE_TABLET | Freq: Three times a day (TID) | ORAL | Status: DC | PRN
  Administered 2018-04-08: 200 mg via ORAL
  Filled 2018-04-08: qty 1

## 2018-04-08 MED ORDER — OXYCODONE-ACETAMINOPHEN 5-325 MG PO TABS
1.0000 | ORAL_TABLET | ORAL | Status: DC | PRN
  Administered 2018-04-08 – 2018-04-09 (×4): 1 via ORAL
  Filled 2018-04-08 (×4): qty 1

## 2018-04-08 MED ORDER — LIDOCAINE-EPINEPHRINE (PF) 2 %-1:200000 IJ SOLN
INTRAMUSCULAR | Status: AC
  Filled 2018-04-08: qty 20

## 2018-04-08 SURGICAL SUPPLY — 24 items
BLADE SURG 11 STRL SS (BLADE) ×3 IMPLANT
CLIP FILSHIE TUBAL LIGA STRL (Clip) ×3 IMPLANT
CLOTH BEACON ORANGE TIMEOUT ST (SAFETY) ×3 IMPLANT
DRSG OPSITE POSTOP 3X4 (GAUZE/BANDAGES/DRESSINGS) ×3 IMPLANT
DURAPREP 26ML APPLICATOR (WOUND CARE) ×3 IMPLANT
GLOVE BIOGEL PI IND STRL 7.0 (GLOVE) ×1 IMPLANT
GLOVE BIOGEL PI IND STRL 7.5 (GLOVE) ×1 IMPLANT
GLOVE BIOGEL PI INDICATOR 7.0 (GLOVE) ×2
GLOVE BIOGEL PI INDICATOR 7.5 (GLOVE) ×2
GLOVE ECLIPSE 7.5 STRL STRAW (GLOVE) ×3 IMPLANT
GOWN STRL REUS W/TWL LRG LVL3 (GOWN DISPOSABLE) ×6 IMPLANT
NEEDLE HYPO 22GX1.5 SAFETY (NEEDLE) ×3 IMPLANT
NS IRRIG 1000ML POUR BTL (IV SOLUTION) ×3 IMPLANT
PACK ABDOMINAL MINOR (CUSTOM PROCEDURE TRAY) ×3 IMPLANT
PROTECTOR NERVE ULNAR (MISCELLANEOUS) ×3 IMPLANT
SPONGE LAP 4X18 RFD (DISPOSABLE) ×3 IMPLANT
SUT PLAIN 2 0 (SUTURE) ×3
SUT PLAIN ABS 2-0 54XMFL TIE (SUTURE) ×1 IMPLANT
SUT VICRYL 0 UR6 27IN ABS (SUTURE) ×3 IMPLANT
SUT VICRYL 4-0 PS2 18IN ABS (SUTURE) ×3 IMPLANT
SYR CONTROL 10ML LL (SYRINGE) ×3 IMPLANT
TOWEL OR 17X24 6PK STRL BLUE (TOWEL DISPOSABLE) ×6 IMPLANT
TRAY FOLEY CATH SILVER 14FR (SET/KITS/TRAYS/PACK) ×3 IMPLANT
WATER STERILE IRR 1000ML POUR (IV SOLUTION) ×3 IMPLANT

## 2018-04-08 NOTE — Anesthesia Postprocedure Evaluation (Signed)
Anesthesia Post Note  Patient: Natasha Bean  Procedure(s) Performed: AN AD HOC LABOR EPIDURAL     Patient location during evaluation: Mother Baby Anesthesia Type: Epidural Level of consciousness: awake and alert Pain management: pain level controlled Vital Signs Assessment: post-procedure vital signs reviewed and stable Respiratory status: spontaneous breathing, nonlabored ventilation and respiratory function stable Cardiovascular status: stable Postop Assessment: no headache, no backache and epidural receding Anesthetic complications: no    Last Vitals:  Vitals:   04/08/18 1218 04/08/18 1227  BP: (!) 127/97 119/67  Pulse: 66 (!) 57  Resp: 20 20  Temp: 36.5 C 36.5 C  SpO2: 96% 97%    Last Pain:  Vitals:   04/08/18 1230  TempSrc:   PainSc: 0-No pain   Pain Goal: Patients Stated Pain Goal: 3 (04/08/18 0720)                 Marrion Coy

## 2018-04-08 NOTE — Progress Notes (Signed)
CHG bath complete

## 2018-04-08 NOTE — Anesthesia Postprocedure Evaluation (Signed)
Anesthesia Post Note  Patient: Natasha Bean  Procedure(s) Performed: POST PARTUM TUBAL LIGATION (Bilateral Abdomen)     Patient location during evaluation: Mother Baby Anesthesia Type: Epidural Level of consciousness: oriented and awake and alert Pain management: pain level controlled Vital Signs Assessment: post-procedure vital signs reviewed and stable Respiratory status: spontaneous breathing and respiratory function stable Cardiovascular status: blood pressure returned to baseline and stable Postop Assessment: no headache, no backache, no apparent nausea or vomiting and able to ambulate Anesthetic complications: no    Last Vitals:  Vitals:   04/08/18 1130 04/08/18 1145  BP: 112/72   Pulse: 63 65  Resp: 14 14  Temp:    SpO2: 96% 97%    Last Pain:  Vitals:   04/08/18 1145  TempSrc:   PainSc: 0-No pain   Pain Goal: Patients Stated Pain Goal: 3 (04/08/18 0720)  LLE Motor Response: Purposeful movement (04/08/18 1145)   RLE Motor Response: Purposeful movement (04/08/18 1145)       Epidural/Spinal Function Cutaneous sensation: Tingles (04/08/18 1145), Patient able to flex knees: Yes (04/08/18 1145), Patient able to lift hips off bed: No (04/08/18 1145), Back pain beyond tenderness at insertion site: No (04/08/18 1145), Progressively worsening motor and/or sensory loss: No (04/08/18 1145), Bowel and/or bladder incontinence post epidural: No (04/08/18 1145)  Trevor IhaStephen A Milam Allbaugh

## 2018-04-08 NOTE — Progress Notes (Signed)
Dr Adrian Blackwater is notified tahtt signed and held orders can not be released. Pt has pain of 8. Dr Adrian Blackwater will place an order for Percocet.

## 2018-04-08 NOTE — Anesthesia Postprocedure Evaluation (Signed)
Anesthesia Post Note  Patient: Natasha Bean  Procedure(s) Performed: POST PARTUM TUBAL LIGATION (canceled)     Patient location during evaluation: Mother Baby Anesthesia Type: Epidural Level of consciousness: awake and alert Pain management: pain level controlled Vital Signs Assessment: post-procedure vital signs reviewed and stable Respiratory status: spontaneous breathing, nonlabored ventilation and respiratory function stable Cardiovascular status: stable Postop Assessment: no headache, no backache and epidural receding Anesthetic complications: no    Last Vitals:  Vitals:   04/08/18 1218 04/08/18 1227  BP: (!) 127/97 119/67  Pulse: 66 (!) 57  Resp: 20 20  Temp: 36.5 C 36.5 C  SpO2: 96% 97%    Last Pain:  Vitals:   04/08/18 1230  TempSrc:   PainSc: 0-No pain   Pain Goal: Patients Stated Pain Goal: 3 (04/08/18 0720)                 Marrion Coy

## 2018-04-08 NOTE — Transfer of Care (Signed)
Immediate Anesthesia Transfer of Care Note  Patient: Natasha Bean  Procedure(s) Performed: POST PARTUM TUBAL LIGATION (Bilateral Abdomen)  Patient Location: PACU  Anesthesia Type:Epidural  Level of Consciousness: awake and patient cooperative  Airway & Oxygen Therapy: Patient Spontanous Breathing  Post-op Assessment: Report given to RN and Post -op Vital signs reviewed and stable  Post vital signs: Reviewed and stable  Last Vitals:  Vitals Value Taken Time  BP    Temp    Pulse 67 04/08/2018 11:06 AM  Resp 14 04/08/2018 11:06 AM  SpO2 95 % 04/08/2018 11:06 AM  Vitals shown include unvalidated device data.  Last Pain:  Vitals:   04/08/18 0932  TempSrc: Oral  PainSc: 4       Patients Stated Pain Goal: 3 (04/08/18 0720)  Complications: No apparent anesthesia complications

## 2018-04-08 NOTE — Op Note (Signed)
Natasha Bean 04/08/2018  PREOPERATIVE DIAGNOSIS:  Undesired fertility  POSTOPERATIVE DIAGNOSIS:  Undesired fertility  PROCEDURE:  Postpartum Bilateral Tubal Sterilization using Filshie Clips   SURGEON:  Dr Candelaria Celeste  ANESTHESIA:  Epidural  COMPLICATIONS:  None immediate.  ESTIMATED BLOOD LOSS:  Less than 20cc.  FLUIDS: 1100 mL LR.  URINE OUTPUT:  Unmeasured amount of clear urine.  INDICATIONS: 33 y.o. yo G2P2002  with undesired fertility,status post vaginal delivery, desires permanent sterilization. Risks and benefits of procedure discussed with patient including permanence of method, bleeding, infection, injury to surrounding organs and need for additional procedures. Risk failure of 0.5-1% with increased risk of ectopic gestation if pregnancy occurs was also discussed with patient.   FINDINGS:  Normal uterus, tubes, and ovaries. Right 1cm peritubular cyst  TECHNIQUE:  The patient was taken to the operating room where her epidural anesthesia was dosed up to surgical level and found to be adequate.  She was then placed in the dorsal supine position and prepped and draped in sterile fashion.  After an adequate timeout was performed, attention was turned to the patient's abdomen where a small transverse skin incision was made under the umbilical fold. The incision was taken down to the layer of fascia using the scalpel, and fascia was incised, and extended bilaterally using Mayo scissors. The peritoneum was entered in a sharp fashion. Attention was then turned to the patient's uterus, and left fallopian tube was identified and followed out to the fimbriated end.  A Filshie clip was placed on the left fallopian tube about 2 cm from the cornual attachment, with care given to incorporate the underlying mesosalpinx.  A similar process was carried out on the rightl side allowing for bilateral tubal sterilization.  Good hemostasis was noted overall. The instruments were then removed from the  patient's abdomen and the fascial incision was repaired with 0 Vicryl, and the skin was closed with a 3-0 Monocryl subcuticular stitch. The patient tolerated the procedure well.  Sponge, lap, and needle counts were correct times two.  The patient was then taken to the recovery room awake, extubated and in stable condition.   Levie Heritage, DO 04/08/2018 11:00 AM

## 2018-04-08 NOTE — Anesthesia Preprocedure Evaluation (Signed)
Anesthesia Evaluation  Patient identified by MRN, date of birth, ID band Patient awake    Reviewed: Allergy & Precautions, NPO status , Patient's Chart, lab work & pertinent test results  Airway Mallampati: II  TM Distance: >3 FB Neck ROM: Full    Dental no notable dental hx. (+) Teeth Intact, Dental Advisory Given   Pulmonary Current Smoker,    Pulmonary exam normal breath sounds clear to auscultation       Cardiovascular hypertension, Pt. on medications Normal cardiovascular exam Rhythm:Regular Rate:Normal     Neuro/Psych  Headaches,    GI/Hepatic negative GI ROS, Neg liver ROS,   Endo/Other  negative endocrine ROS  Renal/GU negative Renal ROS     Musculoskeletal   Abdominal   Peds  Hematology  (+) anemia , Hgb 9.2 Plts 172   Anesthesia Other Findings   Reproductive/Obstetrics negative OB ROS                             Anesthesia Physical Anesthesia Plan  ASA: III  Anesthesia Plan: Epidural   Post-op Pain Management:    Induction: Intravenous  PONV Risk Score and Plan: 2 and Treatment may vary due to age or medical condition, Ondansetron and Dexamethasone  Airway Management Planned: Nasal Cannula and Natural Airway  Additional Equipment:   Intra-op Plan:   Post-operative Plan:   Informed Consent: I have reviewed the patients History and Physical, chart, labs and discussed the procedure including the risks, benefits and alternatives for the proposed anesthesia with the patient or authorized representative who has indicated his/her understanding and acceptance.     Dental advisory given  Plan Discussed with: Anesthesiologist  Anesthesia Plan Comments:        Anesthesia Quick Evaluation

## 2018-04-08 NOTE — Progress Notes (Signed)
Post Partum Day 1 Subjective: no complaints, up ad lib, voiding and tolerating PO  Objective: Blood pressure 137/78, pulse 60, temperature 97.8 F (36.6 C), temperature source Oral, resp. rate 18, height 5\' 2"  (1.575 m), weight 83 kg, last menstrual period 08/04/2017, SpO2 98 %, unknown if currently breastfeeding.  Physical Exam:  General: alert, cooperative and no distress Lochia: appropriate Uterine Fundus: firm DVT Evaluation: No evidence of DVT seen on physical exam. Negative Homan's sign. No cords or calf tenderness. No significant calf/ankle edema.  Recent Labs    04/07/18 0743 04/08/18 0538  HGB 11.1* 9.2*  HCT 34.3* 28.0*    Assessment/Plan: Plan for discharge tomorrow, Breastfeeding and Lactation consult   LOS: 1 day   Natasha Bean 04/08/2018, 9:20 AM

## 2018-04-08 NOTE — Progress Notes (Signed)
Post Partum Day 1 Subjective: no complaints, up ad lib, voiding and tolerating PO, but has been NPO since midnight for BTL today  Objective: Blood pressure 137/78, pulse 60, temperature 97.8 F (36.6 C), temperature source Oral, resp. rate 18, height 5\' 2"  (1.575 m), weight 83 kg, last menstrual period 08/04/2017, SpO2 98 %, unknown if currently breastfeeding.  Physical Exam:  General: alert, cooperative and no distress Lochia: appropriate Uterine Fundus: firm Incision: n/a DVT Evaluation: No evidence of DVT seen on physical exam. Negative Homan's sign. No cords or calf tenderness. No significant calf/ankle edema.  Recent Labs    04/07/18 0743 04/08/18 0538  HGB 11.1* 9.2*  HCT 34.3* 28.0*    Assessment/Plan: Plan for discharge tomorrow  PP BTL today. Risks of procedure discussed with patient including but not limited to: risk of regret, permanence of method, bleeding, infection, injury to surrounding organs and need for additional procedures.  Failure risk of 1 -2 % with increased risk of ectopic gestation if pregnancy occurs was also discussed with patient.    Levie Heritage, DO 04/08/2018 8:59 AM     LOS: 1 day   Levie Heritage 04/08/2018, 8:59 AM

## 2018-04-09 ENCOUNTER — Encounter (HOSPITAL_COMMUNITY): Payer: Self-pay | Admitting: Family Medicine

## 2018-04-09 ENCOUNTER — Telehealth: Payer: Self-pay | Admitting: *Deleted

## 2018-04-09 MED ORDER — OXYCODONE-ACETAMINOPHEN 5-325 MG PO TABS: 1.0000 | ORAL_TABLET | ORAL | 0 refills | Status: DC | PRN

## 2018-04-09 MED ORDER — IBUPROFEN 600 MG PO TABS: 600.0000 mg | ORAL_TABLET | Freq: Four times a day (QID) | ORAL | 0 refills | Status: DC

## 2018-04-09 MED ORDER — AMLODIPINE BESYLATE 5 MG PO TABS: 5.0000 mg | ORAL_TABLET | Freq: Every day | ORAL | 0 refills | Status: DC

## 2018-04-09 MED ORDER — PRENATAL MULTIVITAMIN CH: 1.0000 | ORAL_TABLET | Freq: Every day | ORAL | 0 refills | Status: AC

## 2018-04-09 NOTE — Progress Notes (Signed)
CSW received consult for hx of Anxiety and Depression.  CSW met with MOB to offer support and complete assessment.    When CSW arrived MOB was packing her bags in preparation for discharge. CSW explained CSW's role and  MOB gave CSW permission to complete the assessment while MOB's sisters were present. MOB was polite, easy to engage, and receptive to meeting with CSW.   CSW asked about MOB's MH hx and MOB communicated that MOB lost her father unexpectedly when her oldest child was 6 months.  MOB expressed feeling overwhelmed, sad, and depressed.  CSW validated and normalized MOB's feelings and reviewed the cycle of grief a loss.  MOB reported MOB has an active Rx for Zoloft and takes the medication as needed.  CSW explained the how the medication works and how MOB would benefit from the medication if she took it as prescribed as opposed to taking it PRN; MOB was understanding.   CSW provided education regarding the baby blues period vs. perinatal mood disorders, discussed treatment and gave resources for mental health follow up if concerns arise.  CSW recommends self-evaluation during the postpartum time period using the New Mom Checklist from Postpartum Progress and encouraged MOB to contact a medical professional if symptoms are noted at any time.  CSW assessed for safety and MOB denied, SI, HI, and DV.  MOB reported having a good support team and feeling prepared to parent.   CSW provided review of Sudden Infant Death Syndrome (SIDS) precautions.    CSW identifies no further need for intervention and no barriers to discharge at this time.  Laurey Arrow, MSW, LCSW Clinical Social Work [phone]

## 2018-04-09 NOTE — Telephone Encounter (Signed)
Lmom for pt to call us back to sch pp appt.  04-09-2018  AS

## 2018-04-11 ENCOUNTER — Other Ambulatory Visit: Payer: Self-pay

## 2018-04-11 ENCOUNTER — Ambulatory Visit: Payer: Medicaid Other | Admitting: *Deleted

## 2018-04-11 ENCOUNTER — Encounter: Payer: Self-pay | Admitting: *Deleted

## 2018-04-11 VITALS — BP 160/97 | HR 94 | Ht 62.0 in | Wt 174.0 lb

## 2018-04-11 DIAGNOSIS — Z0130 Encounter for examination of blood pressure without abnormal findings: Secondary | ICD-10-CM

## 2018-04-11 NOTE — Progress Notes (Signed)
Pt here for BP check. Dr Despina Hidden informed of BP reading of 160/97. Pt has not taken her BP medication this morning. Advised to take medication and keep scheduled appt for 04/16/18. Pt verbalized understanding.

## 2018-04-16 ENCOUNTER — Encounter (HOSPITAL_COMMUNITY): Payer: Self-pay

## 2018-04-16 ENCOUNTER — Ambulatory Visit (INDEPENDENT_AMBULATORY_CARE_PROVIDER_SITE_OTHER): Payer: Medicaid Other | Admitting: *Deleted

## 2018-04-16 VITALS — BP 139/94

## 2018-04-16 DIAGNOSIS — Z0130 Encounter for examination of blood pressure without abnormal findings: Secondary | ICD-10-CM

## 2018-04-16 NOTE — Progress Notes (Signed)
Pt in for bp check. Taking Amlodipine 5 mg daily. Pt denies swelling or blurry vision. She does have a slight headache. Reviewed BP with Joellyn HaffKim Booker CNM. She advised patient to increase Amlodipine to 10mg  daily. Patient to return on Friday morning for bp check. Advised to take medicine at least one hour before her visit. Patient agreeable.

## 2018-04-18 ENCOUNTER — Ambulatory Visit: Payer: Medicaid Other | Admitting: *Deleted

## 2018-04-18 VITALS — BP 147/100

## 2018-04-18 DIAGNOSIS — Z0130 Encounter for examination of blood pressure without abnormal findings: Secondary | ICD-10-CM

## 2018-04-18 NOTE — Progress Notes (Signed)
Pt taking amlodipine 10 mg for the past three days per Selena Batten. BP reviewed with Dr. Despina Hidden. Advised patient to continue Amlodipine 10 mg until seen for her PP visit. Patient voiced understanding.

## 2018-04-28 ENCOUNTER — Telehealth: Payer: Self-pay | Admitting: Obstetrics & Gynecology

## 2018-04-28 MED ORDER — AMLODIPINE BESYLATE 10 MG PO TABS: 10.0000 mg | ORAL_TABLET | Freq: Every day | ORAL | 3 refills | Status: DC

## 2018-04-28 NOTE — Telephone Encounter (Signed)
Pt needs rx for Amlodipine 10 mg sent to pharmacy. Her current rx is for 5mg  qd, but was increased and she wasn't given a new rx.

## 2018-04-28 NOTE — Telephone Encounter (Signed)
Patient called, stated pharmacy needs a prior autho for her bp pills.  [facility] [place].  [phone]

## 2018-05-07 ENCOUNTER — Encounter: Payer: Medicaid Other | Admitting: Advanced Practice Midwife

## 2018-05-14 ENCOUNTER — Encounter: Payer: Self-pay | Admitting: Advanced Practice Midwife

## 2018-05-15 ENCOUNTER — Encounter: Payer: Medicaid Other | Admitting: Advanced Practice Midwife

## 2018-05-16 ENCOUNTER — Ambulatory Visit: Payer: Medicaid Other | Admitting: Women's Health

## 2018-09-16 ENCOUNTER — Ambulatory Visit: Payer: Medicaid Other | Admitting: Obstetrics & Gynecology

## 2018-09-23 ENCOUNTER — Encounter: Payer: Self-pay | Admitting: Obstetrics & Gynecology

## 2018-09-23 ENCOUNTER — Ambulatory Visit (INDEPENDENT_AMBULATORY_CARE_PROVIDER_SITE_OTHER): Payer: Medicaid Other | Admitting: Obstetrics & Gynecology

## 2018-09-23 ENCOUNTER — Other Ambulatory Visit: Payer: Self-pay

## 2018-09-23 VITALS — Ht 63.0 in | Wt 153.0 lb

## 2018-09-23 DIAGNOSIS — Z0130 Encounter for examination of blood pressure without abnormal findings: Secondary | ICD-10-CM

## 2018-09-23 NOTE — Progress Notes (Signed)
Chief Complaint  Patient presents with  . Follow-up         Chief Complaint  Patient presents with  . Follow-up      33 y.o. [id number] Patient's last menstrual period was 09/14/2018 (exact date). The current method of family planning is tubal ligation.  Outpatient Encounter Medications as of 09/23/2018  Medication Sig  . albuterol (PROVENTIL HFA;VENTOLIN HFA) 108 (90 Base) MCG/ACT inhaler Inhale into the lungs every 6 (six) hours as needed for wheezing or shortness of breath.  Marland Kitchen amLODipine (NORVASC) 10 MG tablet Take 1 tablet (10 mg total) by mouth daily for 30 days.  . [DISCONTINUED] ibuprofen (ADVIL,MOTRIN) 600 MG tablet Take 1 tablet (600 mg total) by mouth every 6 (six) hours.  . [DISCONTINUED] oxyCODONE-acetaminophen (PERCOCET/ROXICET) 5-325 MG tablet Take 1 tablet by mouth every 4 (four) hours as needed for moderate pain.  . [DISCONTINUED] sertraline (ZOLOFT) 25 MG tablet Take 1 tablet (25 mg total) by mouth daily. (Patient not taking: Reported on 04/11/2018)   No facility-administered encounter medications on file as of 09/23/2018.     Subjective Pt wanted to have BP checked she is having sudden feeling of dropping Past Medical History:  Diagnosis Date  . Anemia   . Asthma   . Contraceptive management 06/10/2015  . Hematuria 01/06/2015  . History of marijuana use 11/01/2014   Repeat UDS- Neg (04/19/2015)  . Migraine   . Pregnancy induced hypertension   . Round ligament pain 01/06/2015  . Trichimoniasis 01/06/2015   Repeat neg- 01/17/15  . Urinary frequency 01/06/2015  . Vaginal discharge during pregnancy in second trimester 01/06/2015    Past Surgical History:  Procedure Laterality Date  . NO PAST SURGERIES    . TUBAL LIGATION Bilateral 04/08/2018   Procedure: POST PARTUM TUBAL LIGATION;  Surgeon: Truett Mainland, DO;  Location: [facility];  Service: Gynecology;  Laterality: Bilateral;    OB History    Gravida  2   Para  2   Term  2   Preterm      AB      Living  2     SAB      TAB      Ectopic      Multiple  0   Live Births  2           Allergies  Allergen Reactions  . Benadryl [Diphenhydramine Hcl] Hives    Makes her shakey and hives  . Fioricet [Butalbital-Apap-Caffeine] Hives, Itching and Nausea And Vomiting    Able to take tylenol     Social History   Socioeconomic History  . Marital status: Single    Spouse name: Not on file  . Number of children: Not on file  . Years of education: Not on file  . Highest education level: Not on file  Occupational History  . Not on file  Social Needs  . Financial resource strain: Not on file  . Food insecurity    Worry: Not on file    Inability: Not on file  . Transportation needs    Medical: Not on file    Non-medical: Not on file  Tobacco Use  . Smoking status: Current Every Day Smoker    Packs/day: 0.25    Years: 14.00    Pack years: 3.50    Types: Cigarettes  . Smokeless tobacco: Never Used  . Tobacco comment: 2-3 per day  Substance and Sexual Activity  . Alcohol use: Not Currently    Comment:  wine sometimes  . Drug use: Not Currently    Types: Marijuana    Comment: last used 1 month ago  . Sexual activity: Not Currently    Birth control/protection: None  Lifestyle  . Physical activity    Days per week: 6 days    Minutes per session: 30 min  . Stress: Only a little  Relationships  . Social Musicianconnections    Talks on phone: Not on file    Gets together: Not on file    Attends religious service: Not on file    Active member of club or organization: Not on file    Attends meetings of clubs or organizations: Not on file    Relationship status: Not on file  Other Topics Concern  . Not on file  Social History Narrative  . Not on file    Family History  Problem Relation Age of Onset  . Cancer Mother        throat  . Hypertension Father   . Diabetes Father   . Cancer Father        colon  . Heart disease Father   . Thyroid disease Father    . Stroke Father   . Kidney failure Father   . Heart disease Paternal Grandmother   . Hypertension Paternal Grandmother   . Diabetes Paternal Grandmother   . Seizures Daughter     Medications:       Current Outpatient Medications:  .  albuterol (PROVENTIL HFA;VENTOLIN HFA) 108 (90 Base) MCG/ACT inhaler, Inhale into the lungs every 6 (six) hours as needed for wheezing or shortness of breath., Disp: , Rfl:  .  amLODipine (NORVASC) 10 MG tablet, Take 1 tablet (10 mg total) by mouth daily for 30 days., Disp: 30 tablet, Rfl: 3  Objective Height 5\' 3"  (1.6 m), weight 153 lb (69.4 kg), last menstrual period 09/14/2018, not currently breastfeeding.    Pertinent ROS   Labs or studies     Impression Diagnoses this Encounter::   ICD-10-CM   1. BP check  Z01.30     Established relevant diagnosis(es):   Plan/Recommendations: No orders of the defined types were placed in this encounter.   Labs or Scans Ordered: No orders of the defined types were placed in this encounter.   Management:: >cut norvasc in half, BP check 2 weeks, then stop it if ok and check BP in 1 more week  Follow up Return in about 2 weeks (around 10/07/2018) for BP check only.        Face to face time:  10 minutes  Greater than 50% of the visit time was spent in counseling and coordination of care with the patient.  The summary and outline of the counseling and care coordination is summarized in the note above.   All questions were answered.

## 2018-10-07 ENCOUNTER — Ambulatory Visit: Payer: Medicaid Other

## 2018-10-10 ENCOUNTER — Telehealth: Payer: Self-pay | Admitting: *Deleted

## 2018-10-10 NOTE — Telephone Encounter (Signed)
Called patient back to check on her she stated that she was taking her kids to her sisters house now and then heading to [facility].

## 2018-10-10 NOTE — Telephone Encounter (Signed)
Patient was transferred to me from the front desk. Patient was tearful on the phone and stated that she was going through a lot. She stated that she had started having thoughts of self harm and suicide. She denies having a plan. I asked patient if she would go to [facility] ED for evaluation and help. Patient agreed to go now. Advised I would call back to check on her soon.

## 2019-01-12 ENCOUNTER — Other Ambulatory Visit: Payer: Medicaid Other | Admitting: Women's Health

## 2019-02-15 DIAGNOSIS — F329 Major depressive disorder, single episode, unspecified: Secondary | ICD-10-CM | POA: Diagnosis not present

## 2019-06-15 ENCOUNTER — Encounter: Payer: Self-pay | Admitting: Women's Health

## 2019-06-15 ENCOUNTER — Ambulatory Visit (INDEPENDENT_AMBULATORY_CARE_PROVIDER_SITE_OTHER): Payer: Medicaid Other | Admitting: Women's Health

## 2019-06-15 ENCOUNTER — Other Ambulatory Visit: Payer: Self-pay

## 2019-06-15 ENCOUNTER — Other Ambulatory Visit (HOSPITAL_COMMUNITY)
Admission: RE | Admit: 2019-06-15 | Discharge: 2019-06-15 | Disposition: A | Discharge status: Home / Self Care | Payer: Medicaid Other | Source: Ambulatory Visit | Attending: Obstetrics & Gynecology | Admitting: Obstetrics & Gynecology

## 2019-06-15 VITALS — BP 107/74 | HR 76 | Ht 63.0 in | Wt 132.0 lb

## 2019-06-15 DIAGNOSIS — R5383 Other fatigue: Secondary | ICD-10-CM | POA: Diagnosis not present

## 2019-06-15 DIAGNOSIS — Z0000 Encounter for general adult medical examination without abnormal findings: Secondary | ICD-10-CM | POA: Diagnosis not present

## 2019-06-15 DIAGNOSIS — Z01419 Encounter for gynecological examination (general) (routine) without abnormal findings: Secondary | ICD-10-CM | POA: Diagnosis not present

## 2019-06-15 DIAGNOSIS — N921 Excessive and frequent menstruation with irregular cycle: Secondary | ICD-10-CM | POA: Diagnosis not present

## 2019-06-15 DIAGNOSIS — N898 Other specified noninflammatory disorders of vagina: Secondary | ICD-10-CM

## 2019-06-15 DIAGNOSIS — Z800 Family history of malignant neoplasm of digestive organs: Secondary | ICD-10-CM | POA: Diagnosis not present

## 2019-06-15 DIAGNOSIS — R102 Pelvic and perineal pain unspecified side: Secondary | ICD-10-CM

## 2019-06-15 DIAGNOSIS — F418 Other specified anxiety disorders: Secondary | ICD-10-CM | POA: Diagnosis not present

## 2019-06-15 LAB — POCT WET PREP (WET MOUNT)
Clue Cells Wet Prep Whiff POC: POSITIVE
Trichomonas Wet Prep HPF POC: ABSENT

## 2019-06-15 MED ORDER — SERTRALINE HCL 25 MG PO TABS: 25.0000 mg | ORAL_TABLET | Freq: Every day | ORAL | 6 refills | Status: DC

## 2019-06-15 MED ORDER — METRONIDAZOLE 0.75 % VA GEL: 1.0000 | Freq: Every day | VAGINAL | 0 refills | Status: DC

## 2019-06-15 NOTE — Progress Notes (Signed)
WELL-WOMAN EXAMINATION Patient name: Natasha Bean MRN [id number]  Date of birth: Mar 06, 1986 Chief Complaint:   Gynecologic Exam  History of Present Illness:   Natasha Bean is a 34 y.o. [id number] African American female being seen today for a routine well-woman exam.  Current complaints: lower pelvic/Rt pelvic pain into Rt back x 20yr, getting worse, worse after sex, feels like it's stabbing. Denies abnormal discharge, itching/odor/irritation.   Monthly periods, but irregular, last 3-4d, changes saturated super tampon and pad 3-4x/day, bleeds onto clothes BID, bad cramps, no clots. Tired lately.  Dep/anx, father passed away in 02/26/2023, didn't allow self to grieve, stressed d/t being out of work d/t covid, recently got job, but still feels dep/anx. Denies SI. Wants therapy/meds.   Depression screen [facility] 2/9 06/15/2019 10/09/2017 09/26/2016  Decreased Interest 0 0 0  Down, Depressed, Hopeless 0 0 0  PHQ - 2 Score 0 0 0  Altered sleeping 0 0 -  Tired, decreased energy 1 0 -  Change in appetite 1 1 -  Feeling bad or failure about yourself  1 0 -  Trouble concentrating 0 0 -  Moving slowly or fidgety/restless 0 0 -  Suicidal thoughts 0 0 -  PHQ-9 Score 3 1 -     PCP: none, states her PCP moved      Patient's last menstrual period was 05/30/2019. The current method of family planning is tubal ligation.  Last pap 09/26/16. Results were: normal. H/O abnormal pap: No Last mammogram: never. Results were: n/a. Family h/o breast cancer: No Last colonoscopy: never. Results were: n/a. Family h/o colorectal cancer: Yes, Dad dx in 76s Review of Systems:   Pertinent items are noted in HPI Denies any headaches, blurred vision, fatigue, shortness of breath, chest pain, abdominal pain, abnormal vaginal discharge/itching/odor/irritation, problems with periods, bowel movements, urination, or intercourse unless otherwise stated above. Pertinent History Reviewed:  Reviewed past medical,surgical, social and  family history.  Reviewed problem list, medications and allergies. Physical Assessment:   Vitals:   06/15/19 1416  BP: 107/74  Pulse: 76  Weight: 132 lb (59.9 kg)  Height: 5\' 3"  (1.6 m)  Body mass index is 23.38 kg/m.        Physical Examination:   General appearance - well appearing, and in no distress  Mental status - alert, oriented to person, place, and time  Psych:  She has a normal mood and affect  Skin - warm and dry, normal color, no suspicious lesions noted  Chest - effort normal, all lung fields clear to auscultation bilaterally  Heart - normal rate and regular rhythm  Neck:  midline trachea, no thyromegaly or nodules  Breasts - breasts appear normal, no suspicious masses, no skin or nipple changes or  axillary nodes  Abdomen - soft, nontender, nondistended, no masses or organomegaly  Pelvic - VULVA: normal appearing vulva with no masses, tenderness or lesions  VAGINA: normal appearing vagina with normal color and discharge, no lesions  CERVIX: normal appearing cervix without discharge or lesions, no CMT  Thin prep pap is done w/ HR HPV cotesting  UTERUS: uterus is felt to be normal size, shape, consistency and +tenderness  ADNEXA: No adnexal masses, +tenderness RT  Extremities:  No swelling or varicosities noted  Chaperone: Amanda Rash    Results for orders placed or performed in visit on 06/15/19 (from the past 24 hour(s))  POCT Wet Prep [facility])   Collection Time: 06/15/19  2:56 PM  Result Value Ref Range  Source Wet Prep POC vaginal    WBC, Wet Prep HPF POC few    Bacteria Wet Prep HPF POC Few Few   BACTERIA WET PREP MORPHOLOGY POC     Clue Cells Wet Prep HPF POC Many (A) None   Clue Cells Wet Prep Whiff POC Positive Whiff    Yeast Wet Prep HPF POC None None   KOH Wet Prep POC     Trichomonas Wet Prep HPF POC Absent Absent    Assessment & Plan:  1) Well-Woman Exam  2) BV> rx metrogel per pt preference, no sex/etoh while using  3) Menorrhagia w/  irregular periods, dysmenorrhea, pelvic pain> will get pelvic u/s, CBC, TSH  4) Dep/anx> rx zoloft 25mg , note sent to Tish to refer to [facility] for therapy/med management  Labs/procedures today: pap, gc/ct, wet prep, cbc, tsh  Mammogram @34yo  or sooner if problems Colonoscopy-53yr prior to dad's dx, or sooner if problems  Orders Placed This Encounter  Procedures  . PELVIS (TRANSABDOMINAL ONLY)  . 4yr PELVIS TRANSVAGINAL NON-OB (TV ONLY)  . CBC  . TSH  . POCT Wet Prep [facility])    Meds:  Meds ordered this encounter  Medications  . sertraline (ZOLOFT) 25 MG tablet    Sig: Take 1 tablet (25 mg total) by mouth daily.    Dispense:  30 tablet    Refill:  6    Order Specific Question:   Supervising Provider    Answer:   [place], LUTHER H [[id number]]  . metroNIDAZOLE (METROGEL VAGINAL) 0.75 % vaginal gel    Sig: Place 1 Applicatorful vaginally at bedtime. X 5 nights, no alcohol or sex while using    Dispense:  70 g    Refill:  0    Order Specific Question:   Supervising Provider    Answer:   [facility] [[id number]]    Follow-up: Return for 1st available, US:GYN and f/u w/ MD after- then 4wks from now for med f/u w/ me.  Despina Hidden CNM, WHNP-BC 06/15/2019 2:59 PM

## 2019-06-16 ENCOUNTER — Telehealth: Payer: Self-pay | Admitting: *Deleted

## 2019-06-16 LAB — CBC
Hematocrit: 40 % (ref 34.0–46.6)
Hemoglobin: 13.1 g/dL (ref 11.1–15.9)
MCH: 29.5 pg (ref 26.6–33.0)
MCHC: 32.8 g/dL (ref 31.5–35.7)
MCV: 90 fL (ref 79–97)
Platelets: 300 10*3/uL (ref 150–450)
RBC: 4.44 x10E6/uL (ref 3.77–5.28)
RDW: 12.7 % (ref 11.7–15.4)
WBC: 5.9 10*3/uL (ref 3.4–10.8)

## 2019-06-16 LAB — TSH: TSH: 2.01 u[IU]/mL (ref 0.450–4.500)

## 2019-06-16 NOTE — Telephone Encounter (Signed)
-----   Message from Cheral Marker, [place] sent at 06/15/2019  2:48 PM EDT ----- Regarding: [facility] referral for dep/anx, rx'd zoloft 25mg  today, interested in therapy/med management. 

## 2019-06-16 NOTE — Telephone Encounter (Signed)
Referral faxed to [facility] today.  

## 2019-06-18 ENCOUNTER — Encounter: Payer: Self-pay | Admitting: Obstetrics and Gynecology

## 2019-06-18 ENCOUNTER — Ambulatory Visit (INDEPENDENT_AMBULATORY_CARE_PROVIDER_SITE_OTHER): Payer: Medicaid Other

## 2019-06-18 ENCOUNTER — Ambulatory Visit (INDEPENDENT_AMBULATORY_CARE_PROVIDER_SITE_OTHER): Payer: Medicaid Other | Admitting: Obstetrics and Gynecology

## 2019-06-18 ENCOUNTER — Other Ambulatory Visit: Payer: Self-pay

## 2019-06-18 VITALS — BP 112/72 | HR 60 | Ht 62.0 in | Wt 132.6 lb

## 2019-06-18 DIAGNOSIS — N921 Excessive and frequent menstruation with irregular cycle: Secondary | ICD-10-CM

## 2019-06-18 DIAGNOSIS — R102 Pelvic and perineal pain: Secondary | ICD-10-CM | POA: Diagnosis not present

## 2019-06-18 DIAGNOSIS — N940 Mittelschmerz: Secondary | ICD-10-CM

## 2019-06-18 LAB — CYTOLOGY - PAP
Chlamydia: NEGATIVE
Comment: NEGATIVE
Comment: NEGATIVE
Comment: NORMAL
Diagnosis: NEGATIVE
High risk HPV: NEGATIVE
Neisseria Gonorrhea: NEGATIVE

## 2019-06-18 NOTE — Progress Notes (Signed)
Patient ID: JOURI THREAT, female   DOB: 1986-02-10, 34 y.o.   MRN: [id number]    [facility] Visit  06/18/19          Patient name: PATTE WINKEL MRN [id number]  Date of birth: 1985/11/29  CC & HPI:  Nikolette Reindl Train is a 34 y.o. female presenting today for gyn follow up and [place]   TA/TV Pelvic US on 06/18/2019 revealed homogeneous anteverted uterus, wnl, EEC 9.7 mm, normal left ovary, normal right ovary, collapsed right corpus luteal cyst, 2.2 x 1.3 x 1.5 cm exophytic right ovarian cyst vs para tubal cyst, small amount of simple cul de sac fluid, right adnexal pain, ovaries appear mobile     She notes that she notices the most pain with her menstrual cycle.   ROS:  Review of Systems  Constitutional: Negative for diaphoresis, fever, malaise/fatigue and weight loss.  HENT: Negative for congestion and sore throat.   Eyes: Negative for blurred vision and double vision.  Respiratory: Negative for cough and shortness of breath.   Cardiovascular: Negative for chest pain, palpitations and leg swelling.  Gastrointestinal: Negative for constipation, diarrhea, nausea and vomiting.  Genitourinary: Negative for frequency and urgency.  Musculoskeletal: Negative for back pain, falls and myalgias.  Skin: Negative for rash.  Neurological: Negative for dizziness, weakness and headaches.  Psychiatric/Behavioral: Negative for depression. The patient is not nervous/anxious.      Pertinent History Reviewed:   Medical         Past Medical History:  Diagnosis Date  . Anemia   . Asthma   . Contraceptive management 06/10/2015  . Hematuria 01/06/2015  . History of marijuana use 11/01/2014   Repeat UDS- Neg (04/19/2015)  . Migraine   . Pregnancy induced hypertension   . Round ligament pain 01/06/2015  . Trichimoniasis 01/06/2015   Repeat neg- 01/17/15  . Urinary frequency 01/06/2015  . Vaginal discharge during pregnancy in second trimester 01/06/2015                               Surgical Hx:    Past Surgical History:  Procedure Laterality Date  . NO PAST SURGERIES    . TUBAL LIGATION Bilateral 04/08/2018   Procedure: POST PARTUM TUBAL LIGATION;  Surgeon: Levie Heritage, DO;  [facility]: [facility];  Service: Gynecology;  Laterality: Bilateral;   Medications: Reviewed & Updated - see associated section                       Current Outpatient Medications:  .  metroNIDAZOLE (METROGEL VAGINAL) 0.75 % vaginal gel, Place 1 Applicatorful vaginally at bedtime. X 5 nights, no alcohol or sex while using, Disp: 70 g, Rfl: 0 .  sertraline (ZOLOFT) 25 MG tablet, Take 1 tablet (25 mg total) by mouth daily., Disp: 30 tablet, Rfl: 6 .  albuterol (PROVENTIL HFA;VENTOLIN HFA) 108 (90 Base) MCG/ACT inhaler, Inhale into the lungs every 6 (six) hours as needed for wheezing or shortness of breath., Disp: , Rfl:    Social History: Reviewed -  reports that she has been smoking cigarettes. She has a 3.50 pack-year smoking history. She has never used smokeless tobacco.  Objective Findings:  Vitals: Blood pressure 112/72, pulse 60, height 5\' 2"  (1.575 m), weight 132 lb 9.6 oz (60.1 kg), last menstrual period 05/30/2019, not currently breastfeeding.  PHYSICAL EXAMINATION General appearance - alert, well appearing, and in no  distress, oriented to person, place, and time, normal appearing weight and well hydrated Mental status - alert, oriented to person, place, and time, normal mood, behavior, speech, dress, motor activity, and thought processes, affect appropriate to mood Chest - not examined Heart - not examined Abdomen - not examined Breasts - not examined Skin - normal coloration and turgor, no rashes, no suspicious skin lesions noted   Assessment & Plan:   A:  1.  rlq pain likely due to ovulation pain.  P:  1.  pt accepts explanation, Rx Nsaids.    By signing my name below, I, [employer], attest that this documentation has been prepared under the direction and in  the presence of Jonnie Kind, MD. Electronically Signed: [place]. 06/18/19. 4:28 PM.  I personally performed the services described in this documentation, which was SCRIBED in my presence. The recorded information has been reviewed and considered accurate. It has been edited as necessary during review. Jonnie Kind, MD

## 2019-06-18 NOTE — Progress Notes (Signed)
PELVIC US TA/TV:homogeneous anteverted uterus,wnl,EEC 9.7 mm,normal left ovary,normal right ovary,collapsed right corpus luteal cyst,2.2 x 1.3 x 1.5 cm exophytic right ovarian cyst vs para tubal cyst,small amount of simple cul de sac fluid,right adnexal pain,ovaries appear mobile

## 2019-07-13 ENCOUNTER — Ambulatory Visit: Payer: Medicaid Other | Admitting: Women's Health

## 2019-08-04 DIAGNOSIS — Z23 Encounter for immunization: Secondary | ICD-10-CM | POA: Diagnosis not present

## 2019-09-08 DIAGNOSIS — Z23 Encounter for immunization: Secondary | ICD-10-CM | POA: Diagnosis not present

## 2021-05-15 ENCOUNTER — Encounter: Payer: Self-pay | Admitting: Obstetrics & Gynecology

## 2021-05-15 ENCOUNTER — Other Ambulatory Visit: Payer: Self-pay

## 2021-05-15 ENCOUNTER — Ambulatory Visit (INDEPENDENT_AMBULATORY_CARE_PROVIDER_SITE_OTHER): Payer: Medicaid Other | Admitting: Obstetrics & Gynecology

## 2021-05-15 VITALS — BP 133/88 | HR 62 | Ht 62.0 in | Wt 154.0 lb

## 2021-05-15 DIAGNOSIS — Z01419 Encounter for gynecological examination (general) (routine) without abnormal findings: Secondary | ICD-10-CM

## 2021-05-15 DIAGNOSIS — N920 Excessive and frequent menstruation with regular cycle: Secondary | ICD-10-CM

## 2021-05-15 DIAGNOSIS — N946 Dysmenorrhea, unspecified: Secondary | ICD-10-CM

## 2021-05-15 MED ORDER — SLYND 4 MG PO TABS: 1.0000 | ORAL_TABLET | Freq: Every day | ORAL | 12 refills | Status: DC

## 2021-05-15 NOTE — Progress Notes (Signed)
Subjective:  ?  ? Natasha Bean is a 36 y.o. female here for a routine exam.  Patient's last menstrual period was 04/28/2021. [id number] ?Birth Control Method:  BTL ?Menstrual Calendar(currently): regular but heavy painful  ?Current complaints: dysmenorrhea menorrhagia.  ? ?Current acute medical issues:  none ?  ?Recent Gynecologic History ?Patient's last menstrual period was 04/28/2021. ?Last Pap: 2021,  normal ?Last mammogram: ,   ? ?Past Medical History:  ?Diagnosis Date  ? Anemia   ? Asthma   ? Contraceptive management 06/10/2015  ? Hematuria 01/06/2015  ? History of marijuana use 11/01/2014  ? Repeat UDS- Neg (04/19/2015)  ? Migraine   ? Pregnancy induced hypertension   ? Round ligament pain 01/06/2015  ? Trichimoniasis 01/06/2015  ? Repeat neg- 01/17/15  ? Urinary frequency 01/06/2015  ? Vaginal discharge during pregnancy in second trimester 01/06/2015  ? ? ?Past Surgical History:  ?Procedure Laterality Date  ? NO PAST SURGERIES    ? TUBAL LIGATION Bilateral 04/08/2018  ? Procedure: POST PARTUM TUBAL LIGATION;  Surgeon: Levie Heritage, DO;  [facility];  Service: Gynecology;  Laterality: Bilateral;  ? ? ?OB History   ? ? Gravida  ?2  ? Para  ?2  ? Term  ?2  ? Preterm  ?   ? AB  ?   ? Living  ?2  ?  ? ? SAB  ?   ? IAB  ?   ? Ectopic  ?   ? Multiple  ?0  ? Live Births  ?2  ?   ?  ?  ? ? ?Social History  ? ?Socioeconomic History  ? Marital status: Single  ?  Spouse name: Not on file  ? Number of children: Not on file  ? Years of education: Not on file  ? Highest education level: Not on file  ?Occupational History  ? Not on file  ?Tobacco Use  ? Smoking status: Every Day  ?  Packs/day: 0.50  ?  Years: 14.00  ?  Pack years: 7.00  ?  Types: Cigarettes  ? Smokeless tobacco: Never  ? Tobacco comments:  ?  2-3 per day  ?Vaping Use  ? Vaping Use: Never used  ?Substance and Sexual Activity  ? Alcohol use: Not Currently  ?  Comment: wine sometimes  ? Drug use: Not Currently  ?  Types: Marijuana  ?  Comment:  last used 1 month ago  ? Sexual activity: Yes  ?  Birth control/protection: Surgical  ?  Comment: tubal  ?Other Topics Concern  ? Not on file  ?Social History Narrative  ? Not on file  ? ?Social Determinants of Health  ? ?Financial Resource Strain: Medium Risk  ? Difficulty of Paying Living Expenses: Somewhat hard  ?Food Insecurity: No Food Insecurity  ? Worried About Programme researcher, broadcasting/film/video in the Last Year: Never true  ? Ran Out of Food in the Last Year: Never true  ?Transportation Needs: Unmet Transportation Needs  ? Lack of Transportation (Medical): Yes  ? Lack of Transportation (Non-Medical): Yes  ?Physical Activity: Insufficiently Active  ? Days of Exercise per Week: 7 days  ? Minutes of Exercise per Session: 20 min  ?Stress: No Stress Concern Present  ? Feeling of Stress : Only a little  ?Social Connections: Moderately Isolated  ? Frequency of Communication with Friends and Family: More than three times a week  ? Frequency of Social Gatherings with Friends and Family: Never  ? Attends Religious  Services: More than 4 times per year  ? Active Member of Clubs or Organizations: No  ? Attends Banker Meetings: Never  ? Marital Status: Never married  ? ? ?Family History  ?Problem Relation Age of Onset  ? Cancer Mother   ?     throat  ? Hypertension Father   ? Diabetes Father   ? Cancer Father   ?     colon  ? Heart disease Father   ? Thyroid disease Father   ? Stroke Father   ? Kidney failure Father   ? Heart disease Paternal Grandmother   ? Hypertension Paternal Grandmother   ? Diabetes Paternal Grandmother   ? Seizures Daughter   ? ? ? ?Current Outpatient Medications:  ?  Drospirenone (SLYND) 4 MG TABS, Take 1 tablet by mouth daily., Disp: 28 tablet, Rfl: 12 ? ?Review of Systems ? ?Review of Systems  ?Constitutional: Negative for fever, chills, weight loss, malaise/fatigue and diaphoresis.  ?HENT: Negative for hearing loss, ear pain, nosebleeds, congestion, sore throat, neck pain, tinnitus and ear  discharge.   ?Eyes: Negative for blurred vision, double vision, photophobia, pain, discharge and redness.  ?Respiratory: Negative for cough, hemoptysis, sputum production, shortness of breath, wheezing and stridor.   ?Cardiovascular: Negative for chest pain, palpitations, orthopnea, claudication, leg swelling and PND.  ?Gastrointestinal: negative for abdominal pain. Negative for heartburn, nausea, vomiting, diarrhea, constipation, blood in stool and melena.  ?Genitourinary: Negative for dysuria, urgency, frequency, hematuria and flank pain.  ?Musculoskeletal: Negative for myalgias, back pain, joint pain and falls.  ?Skin: Negative for itching and rash.  ?Neurological: Negative for dizziness, tingling, tremors, sensory change, speech change, focal weakness, seizures, loss of consciousness, weakness and headaches.  ?Endo/Heme/Allergies: Negative for environmental allergies and polydipsia. Does not bruise/bleed easily.  ?Psychiatric/Behavioral: Negative for depression, suicidal ideas, hallucinations, memory loss and substance abuse. The patient is not nervous/anxious and does not have insomnia.   ? ?  ?  ?Objective:  ?Blood pressure 133/88, pulse 62, height 5\' 2"  (1.575 m), weight 154 lb (69.9 kg), last menstrual period 04/28/2021.  ? Physical Exam  ?Vitals reviewed. ?Constitutional: She is oriented to person, place, and time. She appears well-developed and well-nourished.  ?HENT:  ?Head: Normocephalic and atraumatic.        ?Right Ear: External ear normal.  ?Left Ear: External ear normal.  ?Nose: Nose normal.  ?Mouth/Throat: Oropharynx is clear and moist.  ?Eyes: Conjunctivae and EOM are normal. Pupils are equal, round, and reactive to light. Right eye exhibits no discharge. Left eye exhibits no discharge. No scleral icterus.  ?Neck: Normal range of motion. Neck supple. No tracheal deviation present. No thyromegaly present.  ?Cardiovascular: Normal rate, regular rhythm, normal heart sounds and intact distal pulses.   Exam reveals no gallop and no friction rub.   ?No murmur heard. ?Respiratory: Effort normal and breath sounds normal. No respiratory distress. She has no wheezes. She has no rales. She exhibits no tenderness.  ?GI: Soft. Bowel sounds are normal. She exhibits no distension and no mass. There is no tenderness. There is no rebound and no guarding.  ?Genitourinary:  ?Breasts no masses skin changes or nipple changes bilaterally ?     Vulva is normal without lesions ?Vagina is pink moist without discharge ?Cervix normal in appearance and pap is done ?Uterus is normal size shape and contour ?Adnexa is negative with normal sized ovaries  ? ?Musculoskeletal: Normal range of motion. She exhibits no edema and no tenderness.  ?Neurological: She  is alert and oriented to person, place, and time. She has normal reflexes. She displays normal reflexes. No cranial nerve deficit. She exhibits normal muscle tone. Coordination normal.  ?Skin: Skin is warm and dry. No rash noted. No erythema. No pallor.  ?Psychiatric: She has a normal mood and affect. Her behavior is normal. Judgment and thought content normal.  ? ?   ? ?Medications Ordered at today's visit: ?Meds ordered this encounter  ?Medications  ? Drospirenone (SLYND) 4 MG TABS  ?  Sig: Take 1 tablet by mouth daily.  ?  Dispense:  28 tablet  ?  Refill:  12  ? ? ?Other orders placed at today's visit: ?No orders of the defined types were placed in this encounter. ? ? ? ? ?Assessment:  ? ? Normal Gyn exam.  ?  ?  ICD-10-CM   ?1. Well woman exam with routine gynecological exam  Z01.419   ?  ?2. Menorrhagia with regular cycle  N92.0   ? trial of slynd for management  ?  ?3. Dysmenorrhea  N94.6   ? trial of slynd for management  ?  ? ? ?Plan:  ? ? Contraception: tubal ligation. ?Follow up in: 3 years.   ? ?Follow up for period management 1 year ? ? ?Return in about 1 year (around 05/16/2022) for Follow up, with Dr Despina Hidden. ? ?

## 2021-06-14 ENCOUNTER — Telehealth: Payer: Self-pay | Admitting: Obstetrics & Gynecology

## 2021-06-14 NOTE — Telephone Encounter (Signed)
Pt started taking slynd the day she started her period. She had her period and then it stopped and then she started having brownish bleeding. Advised patient to continue taking pill at the same time daily and this should improve with the next pack. Pt to call back if this doesn't improve. No other questions at this time.  ?

## 2021-06-14 NOTE — Telephone Encounter (Signed)
Patient called stating that she has a personal question to ask either Dr. Despina Hidden or his nurse. I informed patient that Dr. Despina Hidden is not in the office today and I will have a nurse give her a call back. Please contact patient ?

## 2021-06-30 ENCOUNTER — Telehealth: Payer: Self-pay | Admitting: *Deleted

## 2021-06-30 NOTE — Telephone Encounter (Signed)
Pt is on 2nd pack of Slynd. Pt is still bleeding. It's lighter than what it was. Pt had stopped bleeding 2 weeks ago, then she had sex and bleeding started back. Pt has also noticed an odor. I advised she may need to come in for a self swab. Pt voiced understanding and appt was scheduled. JSY ?

## 2021-07-05 ENCOUNTER — Other Ambulatory Visit (HOSPITAL_COMMUNITY)
Admission: RE | Admit: 2021-07-05 | Discharge: 2021-07-05 | Disposition: A | Discharge status: Home / Self Care | Payer: Medicaid Other | Source: Ambulatory Visit | Attending: Obstetrics & Gynecology | Admitting: Obstetrics & Gynecology

## 2021-07-05 ENCOUNTER — Ambulatory Visit (INDEPENDENT_AMBULATORY_CARE_PROVIDER_SITE_OTHER): Payer: Medicaid Other | Admitting: *Deleted

## 2021-07-05 DIAGNOSIS — N930 Postcoital and contact bleeding: Secondary | ICD-10-CM | POA: Insufficient documentation

## 2021-07-05 DIAGNOSIS — N898 Other specified noninflammatory disorders of vagina: Secondary | ICD-10-CM

## 2021-07-05 NOTE — Progress Notes (Signed)
? ?  NURSE VISIT- VAGINITIS/STD/POC ? ?SUBJECTIVE:  ?Natasha Bean is a 36 y.o. [id number] GYN patientfemale here for a vaginal swab for STD screen.  She reports the following symptoms: odor for several  days. ?Denies abnormal vaginal bleeding, significant pelvic pain, fever, or UTI symptoms. ? ?OBJECTIVE:  ?There were no vitals taken for this visit.  ?Appears well, in no apparent distress ? ?ASSESSMENT: ?Vaginal swab for STD screen ? ?PLAN: ?Self-collected vaginal probe for Gonorrhea, Chlamydia, Trichomonas, Bacterial Vaginosis, Yeast sent to lab ?Treatment: to be determined once results are received ?Follow-up as needed if symptoms persist/worsen, or new symptoms develop ? ?Janece Canterbury  ?07/05/2021 ?9:57 AM  ?

## 2021-07-06 LAB — CERVICOVAGINAL ANCILLARY ONLY
Bacterial Vaginitis (gardnerella): POSITIVE — AB
Candida Glabrata: NEGATIVE
Candida Vaginitis: NEGATIVE
Chlamydia: NEGATIVE
Comment: NEGATIVE
Comment: NEGATIVE
Comment: NEGATIVE
Comment: NEGATIVE
Comment: NEGATIVE
Comment: NORMAL
Neisseria Gonorrhea: NEGATIVE
Trichomonas: NEGATIVE

## 2021-07-07 ENCOUNTER — Other Ambulatory Visit: Payer: Self-pay | Admitting: Adult Health

## 2021-07-07 MED ORDER — METRONIDAZOLE 500 MG PO TABS: 500.0000 mg | ORAL_TABLET | Freq: Two times a day (BID) | ORAL | 0 refills | Status: DC

## 2021-07-07 NOTE — Progress Notes (Signed)
+  BV on vaginal swab rx flagyl  

## 2021-08-04 DIAGNOSIS — R079 Chest pain, unspecified: Secondary | ICD-10-CM | POA: Diagnosis not present

## 2022-10-11 ENCOUNTER — Ambulatory Visit: Payer: 59 | Admitting: Advanced Practice Midwife

## 2022-10-11 ENCOUNTER — Encounter: Payer: Self-pay | Admitting: Advanced Practice Midwife

## 2022-10-11 VITALS — BP 120/83 | HR 62 | Ht 62.0 in | Wt 160.0 lb

## 2022-10-11 DIAGNOSIS — R1033 Periumbilical pain: Secondary | ICD-10-CM | POA: Diagnosis not present

## 2022-10-11 DIAGNOSIS — G8929 Other chronic pain: Secondary | ICD-10-CM | POA: Diagnosis not present

## 2022-10-11 DIAGNOSIS — T192XXA Foreign body in vulva and vagina, initial encounter: Secondary | ICD-10-CM | POA: Diagnosis not present

## 2022-10-11 MED ORDER — NORETHIN-ETH ESTRAD-FE BIPHAS 1 MG-10 MCG / 10 MCG PO TABS: 1.0000 | ORAL_TABLET | Freq: Every day | ORAL | 11 refills | Status: AC

## 2022-10-11 NOTE — Progress Notes (Signed)
[facility] Visit  Patient name: Natasha Bean MRN [id number]  Date of birth: 1985-11-24  CC & HPI:  Natasha Bean is a 37 y.o.  female presenting today for pain during intercourse and brown discharge.  She had be prescribed Slynd for period management (had BTL) in 2023, liked using it but changed insurances and it wasn't covered. Would like to go back on a COC .Marland Kitchen Pain w/IC is w/penetration.  Brown discharge only w/IC.  Has had IC ~ 4 times in the past 2 weeks, pain every time.  LMP 09/25/22.   Has had intermittent, but now progressively worse, Periumbilical pain for 4 years.  Had BTL 4 years ago, sought treatment for pain soon after, no source was found.    Pertinent History Reviewed:  Medical & Surgical Hx:   Past Medical History:  Diagnosis Date   Anemia    Asthma    Contraceptive management 06/10/2015   Hematuria 01/06/2015   History of marijuana use 11/01/2014   Repeat UDS- Neg (04/19/2015)   Migraine    Pregnancy induced hypertension    Round ligament pain 01/06/2015   Trichimoniasis 01/06/2015   Repeat neg- 01/17/15   Urinary frequency 01/06/2015   Vaginal discharge during pregnancy in second trimester 01/06/2015   Past Surgical History:  Procedure Laterality Date   NO PAST SURGERIES     TUBAL LIGATION Bilateral 04/08/2018   Procedure: POST PARTUM TUBAL LIGATION;  Surgeon: Levie Heritage, DO;  [facility];  Service: Gynecology;  Laterality: Bilateral;   Family History  Problem Relation Age of Onset   Cancer Mother        throat   Hypertension Father    Diabetes Father    Cancer Father        colon   Heart disease Father    Thyroid disease Father    Stroke Father    Kidney failure Father    Heart disease Paternal Grandmother    Hypertension Paternal Grandmother    Diabetes Paternal Grandmother    Seizures Daughter     Current Outpatient Medications:    Drospirenone (SLYND) 4 MG TABS, Take 1 tablet by mouth daily. (Patient not  taking: Reported on 10/11/2022), Disp: 28 tablet, Rfl: 12 Social History: Reviewed -  reports that she has been smoking cigarettes. She has a 7 pack-year smoking history. She has never used smokeless tobacco.  Review of Systems:   Constitutional: Negative for fever and chills Eyes: Negative for visual disturbances Respiratory: Negative for shortness of breath, dyspnea Cardiovascular: Negative for chest pain or palpitations  Gastrointestinal: Negative for vomiting, diarrhea and constipation; no abdominal pain Genitourinary: Negative for dysuria and urgency, vaginal irritation or itching Musculoskeletal: Negative for back pain, joint pain, myalgias  Neurological: Negative for dizziness and headaches    Objective Findings:    Physical Examination: Vitals:   10/11/22 0849  BP: 120/83  Pulse: 62   General appearance - well appearing, and in no distress Mental status - alert, oriented to person, place, and time Chest:  Normal respiratory effort Heart - normal rate and regular rhythm Abdomen:  Soft, nontender Pelvic: Retained tampon noted and removed Musculoskeletal:  Normal range of motion without pain Extremities:  No edema    No results found for this or any previous visit (from the past 24 hour(s)).    Assessment & Plan:  A:   Retained tampon P:  Boric acid suppositories at bedtime X 7 Pelvic US to R/O GYN source  of chronic periumblical pain   No follow-ups on file.  Jacklyn Shell CNM 10/11/2022 8:57 AM

## 2022-10-11 NOTE — Patient Instructions (Signed)
Boric acid suppositories every night for a week

## 2022-10-11 NOTE — Addendum Note (Signed)
Addended by: Jacklyn Shell on: 10/11/2022 10:59 AM   Modules accepted: Orders

## 2022-10-20 ENCOUNTER — Ambulatory Visit (HOSPITAL_BASED_OUTPATIENT_CLINIC_OR_DEPARTMENT_OTHER): Admission: RE | Admit: 2022-10-20 | Payer: 59 | Source: Ambulatory Visit

## 2022-10-25 ENCOUNTER — Ambulatory Visit: Payer: 59 | Admitting: Obstetrics & Gynecology
